# Patient Record
Sex: Female | Born: 1975 | Race: Black or African American | Hispanic: No | Marital: Single | Smoking: Former smoker
Health system: Southern US, Community
[De-identification: ages and names within clinical notes are randomized; demographics above are authoritative.]

## PROBLEM LIST (undated history)

## (undated) DIAGNOSIS — F419 Anxiety disorder, unspecified: Secondary | ICD-10-CM

## (undated) DIAGNOSIS — F319 Bipolar disorder, unspecified: Secondary | ICD-10-CM

## (undated) DIAGNOSIS — R519 Headache, unspecified: Secondary | ICD-10-CM

## (undated) DIAGNOSIS — T7840XA Allergy, unspecified, initial encounter: Secondary | ICD-10-CM

## (undated) DIAGNOSIS — F410 Panic disorder [episodic paroxysmal anxiety] without agoraphobia: Secondary | ICD-10-CM

## (undated) DIAGNOSIS — O321XX0 Maternal care for breech presentation, not applicable or unspecified: Secondary | ICD-10-CM

## (undated) DIAGNOSIS — F4310 Post-traumatic stress disorder, unspecified: Secondary | ICD-10-CM

## (undated) DIAGNOSIS — R569 Unspecified convulsions: Secondary | ICD-10-CM

## (undated) DIAGNOSIS — G8929 Other chronic pain: Secondary | ICD-10-CM

## (undated) DIAGNOSIS — H539 Unspecified visual disturbance: Secondary | ICD-10-CM

## (undated) DIAGNOSIS — N946 Dysmenorrhea, unspecified: Secondary | ICD-10-CM

## (undated) DIAGNOSIS — F329 Major depressive disorder, single episode, unspecified: Secondary | ICD-10-CM

## (undated) DIAGNOSIS — R5383 Other fatigue: Secondary | ICD-10-CM

## (undated) DIAGNOSIS — F32A Depression, unspecified: Secondary | ICD-10-CM

## (undated) HISTORY — DX: Dysmenorrhea, unspecified: N94.6

## (undated) HISTORY — DX: Other chronic pain: G89.29

## (undated) HISTORY — DX: Other fatigue: R53.83

## (undated) HISTORY — DX: Allergy, unspecified, initial encounter: T78.40XA

## (undated) HISTORY — DX: Anxiety disorder, unspecified: F41.9

## (undated) HISTORY — DX: Headache, unspecified: R51.9

## (undated) HISTORY — DX: Depression, unspecified: F32.A

## (undated) HISTORY — DX: Unspecified visual disturbance: H53.9

---
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
## 2016-06-05 ENCOUNTER — Encounter (HOSPITAL_COMMUNITY): Payer: Self-pay | Admitting: *Deleted

## 2016-06-05 ENCOUNTER — Emergency Department (HOSPITAL_COMMUNITY): Payer: Self-pay

## 2016-06-05 ENCOUNTER — Emergency Department (HOSPITAL_COMMUNITY)
Admission: EM | Admit: 2016-06-05 | Discharge: 2016-06-05 | Disposition: A | Discharge status: Home / Self Care | Payer: Self-pay | Attending: Emergency Medicine | Admitting: Emergency Medicine

## 2016-06-05 DIAGNOSIS — M549 Dorsalgia, unspecified: Secondary | ICD-10-CM

## 2016-06-05 DIAGNOSIS — R0789 Other chest pain: Secondary | ICD-10-CM | POA: Insufficient documentation

## 2016-06-05 DIAGNOSIS — D508 Other iron deficiency anemias: Secondary | ICD-10-CM | POA: Insufficient documentation

## 2016-06-05 DIAGNOSIS — M546 Pain in thoracic spine: Secondary | ICD-10-CM | POA: Insufficient documentation

## 2016-06-05 LAB — BASIC METABOLIC PANEL
ANION GAP: 6 (ref 5–15)
BUN: 8 mg/dL (ref 6–20)
CHLORIDE: 106 mmol/L (ref 101–111)
CO2: 24 mmol/L (ref 22–32)
Calcium: 9.9 mg/dL (ref 8.9–10.3)
Creatinine, Ser: 0.72 mg/dL (ref 0.44–1.00)
GFR calc non Af Amer: >60 mL/min (ref >60)
Glucose, Bld: 132 mg/dL — ABNORMAL HIGH (ref 65–99)
POTASSIUM: 3.6 mmol/L (ref 3.5–5.1)
SODIUM: 136 mmol/L (ref 135–145)

## 2016-06-05 LAB — I-STAT TROPONIN, ED
TROPONIN I, POC: 0 ng/mL (ref 0.00–0.08)
Troponin i, poc: 0 ng/mL (ref 0.00–0.08)

## 2016-06-05 LAB — CBC
HEMATOCRIT: 29.3 % — AB (ref 36.0–46.0)
HEMOGLOBIN: 8.6 g/dL — AB (ref 12.0–15.0)
MCH: 19.6 pg — AB (ref 26.0–34.0)
MCHC: 29.4 g/dL — ABNORMAL LOW (ref 30.0–36.0)
MCV: 66.9 fL — AB (ref 78.0–100.0)
PLATELETS: 395 10*3/uL (ref 150–400)
RBC: 4.38 MIL/uL (ref 3.87–5.11)
RDW: 19.7 % — ABNORMAL HIGH (ref 11.5–15.5)
WBC: 7.1 10*3/uL (ref 4.0–10.5)

## 2016-06-05 LAB — IRON AND TIBC
IRON: 11 ug/dL — AB (ref 28–170)
Saturation Ratios: 2 % — ABNORMAL LOW (ref 10.4–31.8)
TIBC: 472 ug/dL — AB (ref 250–450)
UIBC: 461 ug/dL

## 2016-06-05 LAB — RETICULOCYTES
RBC.: 4.41 MIL/uL (ref 3.87–5.11)
RETIC CT PCT: 1.2 % (ref 0.4–3.1)
Retic Count, Absolute: 52.9 10*3/uL (ref 19.0–186.0)

## 2016-06-05 LAB — FERRITIN: Ferritin: 3 ng/mL — ABNORMAL LOW (ref 11–307)

## 2016-06-05 LAB — POC OCCULT BLOOD, ED: FECAL OCCULT BLD: NEGATIVE

## 2016-06-05 LAB — VITAMIN B12: VITAMIN B 12: 219 pg/mL (ref 180–914)

## 2016-06-05 MED ORDER — FERROUS SULFATE 325 (65 FE) MG PO TABS: 325.0000 mg | ORAL_TABLET | Freq: Every day | ORAL | 0 refills | Status: DC

## 2016-06-05 MED ORDER — CYCLOBENZAPRINE HCL 10 MG PO TABS: 10.0000 mg | ORAL_TABLET | Freq: Two times a day (BID) | ORAL | 0 refills | Status: DC | PRN

## 2016-06-05 MED ORDER — NAPROXEN 500 MG PO TABS: 500.0000 mg | ORAL_TABLET | Freq: Two times a day (BID) | ORAL | 0 refills | Status: DC

## 2016-06-05 NOTE — ED Notes (Signed)
Pt stayed above 98% during ambulation

## 2016-06-05 NOTE — ED Notes (Signed)
Contacted lab for results of anemia panel.  Was informed by lab that this had to be sent to [facility] and would not result until later tonight.  This RN informed PA.

## 2016-06-05 NOTE — ED Triage Notes (Signed)
Pt complains of left sided chest pain, right back pain and shortness of breath for the past 2 days. Pt denies injury to back or chest. Pt states she has had similar pain in the past evaluated but was not diagnosed with anything. Pt states she has hx of anxiety.

## 2016-06-05 NOTE — ED Provider Notes (Signed)
[facility] DEPT Provider Note   CSN: [id number] Arrival date & time: 06/05/16  1244     History   Chief Complaint Chief Complaint  Patient presents with  . Chest Pain  . Back Pain    HPI Sarah Barber is a 40 y.o. female.  HPI   40 year old female presents with c/p and back pain.  Patient will report constant left-sided chest pain ongoing for the past 2 months. Describe pain as a fluttering sensation with occasional sharp pain sometimes worse with breathing and greater pain currently as 8 out of 10. The past 2 days she also experiencing right sided upper back pain that is sharp in nature and worsened with movement. No specific treatment tried. Denies any associated fever, chills, lightheadedness, dizziness, nausea, vomiting, diarrhea, diaphoresis, productive cough, hemoptysis. She does not have any significant cardiac history, she is a nonsmoker, is a social drinker. She is currently working at a call center and admits to having some mild increase of stress. She recently moved here from [place] and has 5 children. Her last menstrual period was 11/19. She denies any recent heavy strenuous activity or any recent injury.      History reviewed. No pertinent past medical history.  There are no active problems to display for this patient.   History reviewed. No pertinent surgical history.  OB History    No data available       Home Medications    Prior to Admission medications   Medication Sig Start Date End Date Taking? Authorizing Provider  ibuprofen (ADVIL,MOTRIN) 200 MG tablet Take 800 mg by mouth every 6 (six) hours as needed for headache, mild pain or moderate pain.   Yes Historical Provider, MD    Family History No family history on file.  Social History Social History  Substance Use Topics  . Smoking status: Never Smoker  . Smokeless tobacco: Never Used  . Alcohol use Yes     Allergies   Other   Review of Systems Review of Systems  All  other systems reviewed and are negative.    Physical Exam Updated Vital Signs BP 135/76 (BP Location: Left Arm)   Pulse 94   Temp 98.4 F (36.9 C) (Oral)   Resp 18   LMP 05/27/2016   SpO2 98%   Physical Exam  Constitutional: She appears well-developed and well-nourished. No distress.  HENT:  Head: Atraumatic.  Eyes: Conjunctivae are normal.  Neck: Neck supple.  Cardiovascular: Normal rate, regular rhythm and intact distal pulses.   Pulmonary/Chest: Effort normal and breath sounds normal. She exhibits tenderness (Tenderness to left anterior chest wall on palpation without crepitus or emphysema.).  Abdominal: Soft. She exhibits no distension. There is no tenderness.  Musculoskeletal: She exhibits tenderness (Tenderness to right posterior shoulder inferior to scapula ridge on palpation without overlying skin changes, crepitus or emphysema.). She exhibits no edema.  Neurological: She is alert.  Skin: No rash noted.  Psychiatric: She has a normal mood and affect.  Nursing note and vitals reviewed.    ED Treatments / Results  Labs (all labs ordered are listed, but only abnormal results are displayed) Labs Reviewed  BASIC METABOLIC PANEL - Abnormal; Notable for the following:       Result Value   Glucose, Bld 132 (*)    All other components within normal limits  CBC - Abnormal; Notable for the following:    Hemoglobin 8.6 (*)    HCT 29.3 (*)    MCV 66.9 (*)  MCH 19.6 (*)    MCHC 29.4 (*)    RDW 19.7 (*)    All other components within normal limits  I-STAT TROPOININ, ED    EKG  EKG Interpretation None      Date: 06/05/2016  Rate: 97  Rhythm: normal sinus rhythm  QRS Axis: normal  Intervals: normal  ST/T Wave abnormalities: normal  Conduction Disutrbances: none  Narrative Interpretation:   Old EKG Reviewed: No significant changes noted     Radiology No results found.  Procedures Procedures (including critical care time)  Medications Ordered in  ED Medications - No data to display   Initial Impression / Assessment and Plan / ED Course  I have reviewed the triage vital signs and the nursing notes.  Pertinent labs & imaging results that were available during my care of the patient were reviewed by me and considered in my medical decision making (see chart for details).  Clinical Course    BP 102/67 (BP Location: Right Arm)   Pulse 81   Temp 97.9 F (36.6 C) (Oral)   Resp 18   LMP 05/27/2016   SpO2 100%    Final Clinical Impressions(s) / ED Diagnoses   Final diagnoses:  Atypical chest pain  Upper back pain on right side  Other iron deficiency anemia    New Prescriptions New Prescriptions   CYCLOBENZAPRINE (FLEXERIL) 10 MG TABLET    Take 1 tablet (10 mg total) by mouth 2 (two) times daily as needed for muscle spasms.   FERROUS SULFATE 325 (65 FE) MG TABLET    Take 1 tablet (325 mg total) by mouth daily.   NAPROXEN (NAPROSYN) 500 MG TABLET    Take 1 tablet (500 mg total) by mouth 2 (two) times daily.   3:02 PM Patient presents with constant reproducible left chest wall pain and now right posterior back pain.  Pain atypical for ACS.  PERC negative,doubt PE. HEART score of 1, low risk of MACE.  Work up initiated.    4:25 PM Pain is likely muscle skeletal. Patient has negative delta troponin. Her labs is remarkable for a hemoglobin of 8.6 without any prior value for comparison.  Pt report she does have hx of anemia, and have heavy period, last menstruation on 11/19.  She is not currently on any treatment for that.  Have her ambulate and her O2 sats are stable.  Plan to obtain an anemia panel.  Will prescribe iron supplementation.  Care discussed with DR. Zammit.    6:55 PM Iron panel have not resulted yet.  Pt will be discharge with iron supplementation.  NSAIDs and muscle relaxant prescribed.  outpt f/u recommended.  Return precaution discussed.  Hemoccult negative.     Domenic Moras, PA-C 06/05/16 1937    Milton Ferguson, MD 06/06/16 [phone]

## 2016-06-06 LAB — FOLATE: Folate: 14.8 ng/mL (ref >5.9)

## 2019-01-28 ENCOUNTER — Other Ambulatory Visit: Payer: Self-pay

## 2019-01-28 DIAGNOSIS — Z20822 Contact with and (suspected) exposure to covid-19: Secondary | ICD-10-CM

## 2019-02-01 LAB — NOVEL CORONAVIRUS, NAA: SARS-CoV-2, NAA: NOT DETECTED

## 2019-04-03 ENCOUNTER — Encounter (HOSPITAL_COMMUNITY): Payer: Self-pay | Admitting: Emergency Medicine

## 2019-04-03 ENCOUNTER — Other Ambulatory Visit: Payer: Self-pay

## 2019-04-03 DIAGNOSIS — R1900 Intra-abdominal and pelvic swelling, mass and lump, unspecified site: Secondary | ICD-10-CM | POA: Insufficient documentation

## 2019-04-03 DIAGNOSIS — R1031 Right lower quadrant pain: Secondary | ICD-10-CM | POA: Insufficient documentation

## 2019-04-03 DIAGNOSIS — K8689 Other specified diseases of pancreas: Secondary | ICD-10-CM | POA: Insufficient documentation

## 2019-04-03 DIAGNOSIS — K8020 Calculus of gallbladder without cholecystitis without obstruction: Secondary | ICD-10-CM | POA: Insufficient documentation

## 2019-04-03 DIAGNOSIS — D649 Anemia, unspecified: Secondary | ICD-10-CM | POA: Insufficient documentation

## 2019-04-03 DIAGNOSIS — Z79899 Other long term (current) drug therapy: Secondary | ICD-10-CM | POA: Insufficient documentation

## 2019-04-03 LAB — COMPREHENSIVE METABOLIC PANEL
ALT: 17 U/L (ref 0–44)
AST: 20 U/L (ref 15–41)
Albumin: 3.7 g/dL (ref 3.5–5.0)
Alkaline Phosphatase: 62 U/L (ref 38–126)
Anion gap: 6 (ref 5–15)
BUN: 9 mg/dL (ref 6–20)
CO2: 25 mmol/L (ref 22–32)
Calcium: 9.2 mg/dL (ref 8.9–10.3)
Chloride: 106 mmol/L (ref 98–111)
Creatinine, Ser: 0.63 mg/dL (ref 0.44–1.00)
GFR calc Af Amer: >60 mL/min (ref >60)
GFR calc non Af Amer: >60 mL/min (ref >60)
Glucose, Bld: 125 mg/dL — ABNORMAL HIGH (ref 70–99)
Potassium: 3.9 mmol/L (ref 3.5–5.1)
Sodium: 137 mmol/L (ref 135–145)
Total Bilirubin: 0.5 mg/dL (ref 0.3–1.2)
Total Protein: 7.5 g/dL (ref 6.5–8.1)

## 2019-04-03 LAB — CBC
HCT: 27.4 % — ABNORMAL LOW (ref 36.0–46.0)
Hemoglobin: 7.2 g/dL — ABNORMAL LOW (ref 12.0–15.0)
MCH: 16.9 pg — ABNORMAL LOW (ref 26.0–34.0)
MCHC: 26.3 g/dL — ABNORMAL LOW (ref 30.0–36.0)
MCV: 64.3 fL — ABNORMAL LOW (ref 80.0–100.0)
Platelets: 248 10*3/uL (ref 150–400)
RBC: 4.26 MIL/uL (ref 3.87–5.11)
RDW: 23.9 % — ABNORMAL HIGH (ref 11.5–15.5)
WBC: 7.2 10*3/uL (ref 4.0–10.5)
nRBC: 0 % (ref 0.0–0.2)

## 2019-04-03 LAB — LIPASE, BLOOD: Lipase: 36 U/L (ref 11–51)

## 2019-04-03 LAB — HCG, QUANTITATIVE, PREGNANCY: hCG, Beta Chain, Quant, S: <1 m[IU]/mL (ref <5)

## 2019-04-03 MED ORDER — SODIUM CHLORIDE 0.9% FLUSH
3.0000 mL | Freq: Once | INTRAVENOUS | Status: AC
  Administered 2019-04-04: 3 mL via INTRAVENOUS

## 2019-04-03 NOTE — ED Triage Notes (Addendum)
Patient complaining of lower right abdominal pain and a headache. Patient states it has been going on for two months but it has gotten worse. Patient has not other complaints. Patient has taken naproxen and has not had any relief of pain.

## 2019-04-04 ENCOUNTER — Encounter (HOSPITAL_COMMUNITY): Payer: Self-pay

## 2019-04-04 ENCOUNTER — Emergency Department (HOSPITAL_COMMUNITY)
Admission: EM | Admit: 2019-04-04 | Discharge: 2019-04-04 | Disposition: A | Discharge status: Home / Self Care | Payer: Self-pay | Attending: Emergency Medicine | Admitting: Emergency Medicine

## 2019-04-04 ENCOUNTER — Emergency Department (HOSPITAL_COMMUNITY): Payer: Self-pay

## 2019-04-04 DIAGNOSIS — R1900 Intra-abdominal and pelvic swelling, mass and lump, unspecified site: Secondary | ICD-10-CM

## 2019-04-04 DIAGNOSIS — R1031 Right lower quadrant pain: Secondary | ICD-10-CM

## 2019-04-04 DIAGNOSIS — D509 Iron deficiency anemia, unspecified: Secondary | ICD-10-CM

## 2019-04-04 DIAGNOSIS — K8020 Calculus of gallbladder without cholecystitis without obstruction: Secondary | ICD-10-CM

## 2019-04-04 DIAGNOSIS — K8689 Other specified diseases of pancreas: Secondary | ICD-10-CM

## 2019-04-04 MED ORDER — ONDANSETRON HCL 4 MG/2ML IJ SOLN
4.0000 mg | Freq: Once | INTRAMUSCULAR | Status: AC
  Administered 2019-04-04: 02:00:00 4 mg via INTRAVENOUS
  Filled 2019-04-04: qty 2

## 2019-04-04 MED ORDER — METOCLOPRAMIDE HCL 10 MG PO TABS: 10.0000 mg | ORAL_TABLET | Freq: Four times a day (QID) | ORAL | 0 refills | Status: DC | PRN

## 2019-04-04 MED ORDER — FERROUS SULFATE 325 (65 FE) MG PO TABS: 325.0000 mg | ORAL_TABLET | Freq: Every day | ORAL | 0 refills | Status: DC

## 2019-04-04 MED ORDER — HYDROCODONE-ACETAMINOPHEN 5-325 MG PO TABS: 1.0000 | ORAL_TABLET | ORAL | 0 refills | Status: DC | PRN

## 2019-04-04 MED ORDER — MORPHINE SULFATE (PF) 4 MG/ML IV SOLN
4.0000 mg | Freq: Once | INTRAVENOUS | Status: AC
  Administered 2019-04-04: 4 mg via INTRAVENOUS
  Filled 2019-04-04: qty 1

## 2019-04-04 MED ORDER — IOHEXOL 300 MG/ML  SOLN
100.0000 mL | Freq: Once | INTRAMUSCULAR | Status: AC | PRN
  Administered 2019-04-04: 03:00:00 100 mL via INTRAVENOUS

## 2019-04-04 MED ORDER — SODIUM CHLORIDE 0.9 % IV BOLUS
1000.0000 mL | Freq: Once | INTRAVENOUS | Status: AC
  Administered 2019-04-04: 02:00:00 1000 mL via INTRAVENOUS

## 2019-04-04 NOTE — Discharge Instructions (Addendum)
Your CT scan showed large masses in your pelvis.  These are likely uterine fibroids, but you need to follow-up with the gynecologist to fully evaluate this.  Radiologist recommends an MRI scan.  Your CT scan also showed a slightly dilated duct in your pancreas.  Please follow-up with the gastroenterologist to evaluate this.  Radiologist recommends a test called MRCP to evaluate this.  Additionally, your CT scan showed you have a gallstone.  If you have upper abdominal pain with vomiting, and come to the emergency department.  While many people will have gallstones that do not cause any problems, if you start having pain from the gallstone, you would need surgery to remove the gallbladder.  Your hemoglobin is very low.  This is probably related to blood loss from your menstrual cycle.  I have given you a new prescription for iron tablets.  Please take this every day.

## 2019-04-04 NOTE — ED Provider Notes (Signed)
[facility] DEPT Provider Note   CSN: [id number] Arrival date & time: 04/03/19  2009    History   Chief Complaint Chief Complaint  Patient presents with   Abdominal Pain    HPI Sarah Barber is a 43 y.o. female.   The history is provided by the patient.  Abdominal Pain She has been having right lower abdominal pain for the last several months, getting worse.  Pain is constant.  It is worse if she lays prone, but nothing seems to make it better.  She currently rates pain at 8/10.  There is been some mild nausea but no vomiting.  She denies constipation or diarrhea.  She denies any urinary symptoms.  She has been having normal menses.  She claims she is sexually abstinent.  She denies fever, chills, sweats.  There has been no change in appetite and there has been no weight loss.  He has been taking naproxen, but it is not helping her pain.  History reviewed. No pertinent past medical history.  There are no active problems to display for this patient.   History reviewed. No pertinent surgical history.   OB History   No obstetric history on file.      Home Medications    Prior to Admission medications   Medication Sig Start Date End Date Taking? Authorizing Provider  cyclobenzaprine (FLEXERIL) 10 MG tablet Take 1 tablet (10 mg total) by mouth 2 (two) times daily as needed for muscle spasms. 06/05/16   Domenic Moras, PA-C  ferrous sulfate 325 (65 FE) MG tablet Take 1 tablet (325 mg total) by mouth daily. 06/05/16   Domenic Moras, PA-C  ibuprofen (ADVIL,MOTRIN) 200 MG tablet Take 800 mg by mouth every 6 (six) hours as needed for headache, mild pain or moderate pain.    [provider]  naproxen (NAPROSYN) 500 MG tablet Take 1 tablet (500 mg total) by mouth 2 (two) times daily. 06/05/16   Domenic Moras, PA-C    Family History History reviewed. No pertinent family history.  Social History Social History   Tobacco Use   Smoking status:  Never Smoker   Smokeless tobacco: Never Used  Substance Use Topics   Alcohol use: Yes   Drug use: Never     Allergies   Other   Review of Systems Review of Systems  Gastrointestinal: Positive for abdominal pain.  All other systems reviewed and are negative.    Physical Exam Updated Vital Signs BP 121/70 (BP Location: Left Arm)    Pulse (!) 107    Temp 98.5 F (36.9 C) (Oral)    Resp 18    Ht 5\' 8"  (1.727 m)    Wt 77.1 kg    LMP 03/13/2019    SpO2 100%    BMI 25.85 kg/m   Physical Exam Vitals signs and nursing note reviewed.    43 year old female, resting comfortably and in no acute distress. Vital signs are significant for slightly elevated heart rate. Oxygen saturation is 100%, which is normal. Head is normocephalic and atraumatic. PERRLA, EOMI. Oropharynx is clear. Neck is nontender and supple without adenopathy or JVD. Back is nontender and there is no CVA tenderness. Lungs are clear without rales, wheezes, or rhonchi. Chest is nontender. Heart has regular rate and rhythm without murmur. Abdomen is soft, flat, with moderate right lower quadrant tenderness.  There is no rebound or guarding.  There are no masses or hepatosplenomegaly and peristalsis is normoactive. Extremities have no cyanosis or  edema, full range of motion is present. Skin is warm and dry without rash. Neurologic: Mental status is normal, cranial nerves are intact, there are no motor or sensory deficits.  ED Treatments / Results  Labs (all labs ordered are listed, but only abnormal results are displayed) Labs Reviewed  COMPREHENSIVE METABOLIC PANEL - Abnormal; Notable for the following components:      Result Value   Glucose, Bld 125 (*)    All other components within normal limits  CBC - Abnormal; Notable for the following components:   Hemoglobin 7.2 (*)    HCT 27.4 (*)    MCV 64.3 (*)    MCH 16.9 (*)    MCHC 26.3 (*)    RDW 23.9 (*)    All other components within normal limits  LIPASE,  BLOOD  HCG, QUANTITATIVE, PREGNANCY  URINALYSIS, ROUTINE W REFLEX MICROSCOPIC   Radiology Ct Abdomen Pelvis W Contrast  Result Date: 04/04/2019 CLINICAL DATA:  Right lower quadrant abdominal pain. Patient reports progressive pain for 2 months. EXAM: CT ABDOMEN AND PELVIS WITH CONTRAST TECHNIQUE: Multidetector CT imaging of the abdomen and pelvis was performed using the standard protocol following bolus administration of intravenous contrast. CONTRAST:  132mL OMNIPAQUE IOHEXOL 300 MG/ML  SOLN COMPARISON:  None. FINDINGS: Lower chest: Breathing motion artifact through the lung bases.6 no consolidation. Hepatobiliary: Right hepatic dome not entirely included in the field of view. No focal abnormality. Small calcified gallstones within physiologically distended gallbladder. No biliary dilatation. Pancreas: No peripancreatic inflammation. Prominent proximal pancreatic duct at 5 mm. No evidence pancreatic mass. Spleen: Normal in size without focal abnormality. Adrenals/Urinary Tract: Normal adrenal glands. No hydronephrosis or perinephric edema. Homogeneous renal enhancement with symmetric excretion on delayed phase imaging. Urinary bladder is physiologically distended without wall thickening. Stomach/Bowel: Stomach is within normal limits. Appendix appears normal. No evidence of bowel wall thickening, distention, or inflammatory changes. Vascular/Lymphatic: Normal caliber abdominal aorta. No acute vascular findings. No enlarged lymph nodes in the abdomen or pelvis. Reproductive: Large heterogeneous enhancing bilateral adnexal/pelvic masses. Unclear if this represents a bilobed mass or cyst to adjacent lesions. On the right this measures 10.4 x 16.7 x 12.2 cm. On the left this measures 7.9 x 6.2 x 5.9 cm with a coarse calcification.1. This is contiguous with the fundus of the uterus. Findings may represent large uterine fibroids versus ovarian masses. Other: Trace free fluid in the pelvis. No upper abdominal  ascites. No evidence of omental caking. No free air. Small fat containing umbilical hernia. Musculoskeletal: There are no acute or suspicious osseous abnormalities. IMPRESSION: 1. Large heterogeneous enhancing bilateral adnexal/pelvic masses. These may represent large exophytic uterine fibroids versus ovarian masses. Recommend GYN consultation. While pelvic ultrasound may be helpful for characterization, further definition of anatomy may be better defined with MRI. 2. Cholelithiasis without gallbladder inflammation. 3. 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 No peripancreatic inflammation. Recommend further evaluation with MRCP on an elective basis. Electronically Signed   By: Keith Rake M.D.   On: 04/04/2019 03:59    Procedures Procedures   Medications Ordered in ED Medications  sodium chloride flush (NS) 0.9 % injection 3 mL (has no administration in time range)  sodium chloride 0.9 % bolus 1,000 mL (0 mLs Intravenous Stopped 04/04/19 0414)  morphine 4 MG/ML injection 4 mg (4 mg Intravenous Given 04/04/19 0221)  ondansetron (ZOFRAN) injection 4 mg (4 mg Intravenous Given 04/04/19 0220)  iohexol (OMNIPAQUE) 300 MG/ML solution 100 mL (100 mLs Intravenous Contrast Given 04/04/19 0247)  Initial Impression / Assessment and Plan / ED Course  I have reviewed the triage vital signs and the nursing notes.  Pertinent labs & imaging results that were available during my care of the patient were reviewed by me and considered in my medical decision making (see chart for details).  Right lower quadrant pain of uncertain cause.  With chronicity of symptoms, doubt appendicitis.  Also, doubt diverticulitis.  Abdominal tenderness is outside of the true pelvis.  Initial labs show microcytic anemia which is slightly worse than 3 years ago.  She will be sent for CT of abdomen and pelvis.  CT scan shows large pelvic masses which are likely uterine fibroids, but could be from  ovaries.  Additional findings include dilated pancreatic duct, and gallstone.  Patient is advised of these findings.  She is referred to gynecology for further evaluation of her pelvic masses, and referred to gastroenterology to evaluate her dilated pancreatic duct.  Anemia is presumably secondary to menstrual blood loss.  She had been on iron in the past and she is given a new prescription for iron.  She is given prescription for metoclopramide and a small number of hydrocodone-acetaminophen tablets.  Return precautions discussed.  Final Clinical Impressions(s) / ED Diagnoses   Final diagnoses:  RLQ abdominal pain  Pelvic mass  Cholelithiasis without cholecystitis  Dilated pancreatic duct  Microcytic anemia    ED Discharge Orders         Ordered    ferrous sulfate 325 (65 FE) MG tablet  Daily     04/04/19 0417    Ambulatory referral to Obstetrics / Gynecology     04/04/19 0417    Ambulatory referral to Gastroenterology    Comments: Evaluate dilated pancreatic duct   04/04/19 0417    HYDROcodone-acetaminophen (NORCO) 5-325 MG tablet  Every 4 hours PRN     04/04/19 0417    metoCLOPramide (REGLAN) 10 MG tablet  Every 6 hours PRN     04/04/19 [id number]           Delora Fuel, MD [id number]

## 2019-04-09 ENCOUNTER — Other Ambulatory Visit: Payer: Self-pay

## 2019-04-09 ENCOUNTER — Encounter: Payer: Self-pay | Admitting: Gastroenterology

## 2019-04-09 ENCOUNTER — Ambulatory Visit (INDEPENDENT_AMBULATORY_CARE_PROVIDER_SITE_OTHER): Payer: Self-pay | Admitting: Gastroenterology

## 2019-04-09 VITALS — BP 98/68 | HR 79 | Temp 98.2°F | Ht 68.0 in | Wt 174.0 lb

## 2019-04-09 DIAGNOSIS — R1030 Lower abdominal pain, unspecified: Secondary | ICD-10-CM

## 2019-04-09 DIAGNOSIS — R9389 Abnormal findings on diagnostic imaging of other specified body structures: Secondary | ICD-10-CM

## 2019-04-09 DIAGNOSIS — K8689 Other specified diseases of pancreas: Secondary | ICD-10-CM

## 2019-04-09 DIAGNOSIS — K8020 Calculus of gallbladder without cholecystitis without obstruction: Secondary | ICD-10-CM

## 2019-04-09 MED ORDER — TRAMADOL HCL 50 MG PO TABS: 50.0000 mg | ORAL_TABLET | Freq: Three times a day (TID) | ORAL | 0 refills | Status: DC | PRN

## 2019-04-09 NOTE — Progress Notes (Signed)
Sarah Barber    [id number]    07-Sep-1975  Primary Care Physician:Patient, No Pcp Per  Referring Physician: Delora Fuel, MD [address] [place],  [place] [address]   Chief complaint:  Lower abdominal pain  HPI:  43 year old female here for new patient visit with complaints of lower abdominal pain.  She was recently seen in the ER April 04, 2019.    CT abdomen and pelvis with contrast on April 04, 2019 showed large heterogenous mass >10-15cm, bilobed mass or cystic lesion with calcification and is contiguous with the fundus of the uterus.   She continues to have persistent pain, she started having menstrual cycle last Friday when the pain started and she usually has significant discomfort during her menstrual.  Denies menorrhagia. No change in bowel habits, melena or rectal bleeding.  Incidental finding of dilated proximal pancreatic duct to 5 mm with no evidence of pancreatic cyst or mass lesion.  Lipase and LFT within normal limits.  Additional small calcified gallstones within distended gallbladder with no gallbladder wall thickening or findings to suggest cholecystitis.   Outpatient Encounter Medications as of 04/09/2019  Medication Sig  . ferrous sulfate 325 (65 FE) MG tablet Take 1 tablet (325 mg total) by mouth daily.  Marland Kitchen HYDROcodone-acetaminophen (NORCO) 5-325 MG tablet Take 1 tablet by mouth every 4 (four) hours as needed for moderate pain.  Marland Kitchen ibuprofen (ADVIL,MOTRIN) 200 MG tablet Take 800 mg by mouth every 6 (six) hours as needed for headache, mild pain or moderate pain.  . [DISCONTINUED] metoCLOPramide (REGLAN) 10 MG tablet Take 1 tablet (10 mg total) by mouth every 6 (six) hours as needed for nausea (or headache). (Patient not taking: Reported on 04/09/2019)   No facility-administered encounter medications on file as of 04/09/2019.     Allergies as of 04/09/2019 - Review Complete 04/09/2019  Allergen Reaction Noted  . Other Rash 06/05/2016     No past medical history on file.  Past Surgical History:  Procedure Laterality Date  . CESAREAN SECTION      Family History  Problem Relation Age of Onset  . Diabetes Mother   . Cerebrovascular Accident Father        x2  . Diabetes Maternal Grandmother   . Kidney disease Maternal Grandmother   . Lung cancer Maternal Grandfather     Social History   Socioeconomic History  . Marital status: Legally Separated    Spouse name: Not on file  . Number of children: Not on file  . Years of education: Not on file  . Highest education level: Not on file  Occupational History  . Not on file  Social Needs  . Financial resource strain: Not on file  . Food insecurity    Worry: Not on file    Inability: Not on file  . Transportation needs    Medical: Not on file    Non-medical: Not on file  Tobacco Use  . Smoking status: Never Smoker  . Smokeless tobacco: Never Used  Substance and Sexual Activity  . Alcohol use: Yes  . Drug use: Never  . Sexual activity: Not on file  Lifestyle  . Physical activity    Days per week: Not on file    Minutes per session: Not on file  . Stress: Not on file  Relationships  . Social Herbalist on phone: Not on file    Gets together: Not on file  Attends religious service: Not on file    Active member of club or organization: Not on file    Attends meetings of clubs or organizations: Not on file    Relationship status: Not on file  . Intimate partner violence    Fear of current or ex partner: Not on file    Emotionally abused: Not on file    Physically abused: Not on file    Forced sexual activity: Not on file  Other Topics Concern  . Not on file  Social History Narrative  . Not on file      Review of systems: Review of Systems  Constitutional: Negative for fever and chills.  HENT: Negative.   Eyes: Negative for blurred vision.  Respiratory: Negative for cough, shortness of breath and wheezing.   Cardiovascular:  Negative for chest pain and palpitations.  Gastrointestinal: as per HPI Genitourinary: Negative for dysuria, urgency, frequency and hematuria.  Musculoskeletal: Negative for myalgias, back pain and joint pain.  Skin: Negative for itching and rash.  Neurological: Negative for dizziness, tremors, focal weakness, seizures and loss of consciousness.  Endo/Heme/Allergies: Positive for seasonal allergies.  Psychiatric/Behavioral: Negative for suicidal ideas and hallucinations.  Positive for depression All other systems reviewed and are negative.   Physical Exam: Vitals:   04/09/19 0826  BP: 98/68  Pulse: 79  Temp: 98.2 F (36.8 C)   Body mass index is 26.46 kg/m. Gen:      No acute distress HEENT:  EOMI, sclera anicteric Neck:     No masses; no thyromegaly Lungs:    Clear to auscultation bilaterally; normal respiratory effort CV:         Regular rate and rhythm; no murmurs Abd:      + bowel sounds; lower abdomen palpable firm mass with tenderness  Ext:    No edema; adequate peripheral perfusion Skin:      Warm and dry; no rash Neuro: alert and oriented x 3 Psych: normal mood and affect  Data Reviewed:  Reviewed labs, radiology imaging, old records and pertinent past GI work up   Assessment and Plan/Recommendations:  43 year old female with with history of depression here with complaints of lower abdominal pain. Large heterogeneous ovarian mass versus fibroid  Incidental finding of gallstones and slightly dilated prominent proximal pancreatic duct. Gallstone with no cholecystitis. No LFT abnormality We will obtain MRI MRCP to better characterize PD dilation and exclude any pancreatic lesions.  If no other etiology other than gallstones, can consider cholecystectomy once she had work-up/management for the ovarian mass  Advised patient to follow-up with GYN for the heterogeneous ovarian/uterine mass  Tramadol 50 mg every 8 hours as needed for pain control, discuss with  PMD/GYN if needed additional pain control  Return as needed   K. Denzil Magnuson , MD    CC: Delora Fuel, MD

## 2019-04-09 NOTE — Patient Instructions (Addendum)
You have been scheduled for an MRI/MRCP at [facility] Department on 04/17/2019 at 3pm. Your appointment time is 3pm. Please arrive 15 minutes prior to your appointment time for registration purposes. Please make certain not to have anything to eat or drink 6 hours prior to your test. In addition, if you have any metal in your body, have a pacemaker or defibrillator, please be sure to let your ordering physician know. This test typically takes 45 minutes to 1 hour to complete. Should you need to reschedule, please call [phone] to do so.  You need to schedule an urgent appointment with your GYN  We have sent Tramadol to your pharmacy   If you are age 4 or older, your body mass index should be between 23-30. Your Body mass index is 26.46 kg/m. If this is out of the aforementioned range listed, please consider follow up with your Primary Care Provider.  If you are age 67 or younger, your body mass index should be between 19-25. Your Body mass index is 26.46 kg/m. If this is out of the aformentioned range listed, please consider follow up with your Primary Care Provider.    I appreciate the  opportunity to care for you  Thank You   Harl Bowie , MD

## 2019-04-17 ENCOUNTER — Ambulatory Visit (HOSPITAL_COMMUNITY)
Admission: RE | Admit: 2019-04-17 | Discharge: 2019-04-17 | Disposition: A | Discharge status: Home / Self Care | Payer: Medicaid Other | Source: Ambulatory Visit | Attending: Gastroenterology | Admitting: Gastroenterology

## 2019-04-17 ENCOUNTER — Other Ambulatory Visit: Payer: Self-pay

## 2019-04-17 ENCOUNTER — Other Ambulatory Visit: Payer: Self-pay | Admitting: Gastroenterology

## 2019-04-17 DIAGNOSIS — R9389 Abnormal findings on diagnostic imaging of other specified body structures: Secondary | ICD-10-CM | POA: Insufficient documentation

## 2019-04-17 DIAGNOSIS — R1030 Lower abdominal pain, unspecified: Secondary | ICD-10-CM | POA: Diagnosis not present

## 2019-04-17 MED ORDER — GADOBUTROL 1 MMOL/ML IV SOLN
8.0000 mL | Freq: Once | INTRAVENOUS | Status: AC | PRN
  Administered 2019-04-17: 8 mL via INTRAVENOUS

## 2019-04-20 ENCOUNTER — Telehealth: Payer: Self-pay | Admitting: Gastroenterology

## 2019-04-20 NOTE — Telephone Encounter (Signed)
[facility] radiology calling regarding MR results, report in epic. Dr. Silverio Decamp notified report ready in epic.

## 2019-04-21 ENCOUNTER — Telehealth: Payer: Self-pay | Admitting: Gastroenterology

## 2019-04-21 NOTE — Telephone Encounter (Signed)
Pt spoke to Dr. Silverio Decamp today.

## 2019-04-21 NOTE — Telephone Encounter (Signed)
Ok thanks 

## 2019-04-22 NOTE — Progress Notes (Signed)
reviewed

## 2019-04-27 ENCOUNTER — Encounter: Payer: Self-pay | Admitting: Family Medicine

## 2019-04-27 ENCOUNTER — Other Ambulatory Visit: Payer: Self-pay

## 2019-04-27 ENCOUNTER — Ambulatory Visit (INDEPENDENT_AMBULATORY_CARE_PROVIDER_SITE_OTHER): Payer: Self-pay | Admitting: Family Medicine

## 2019-04-27 VITALS — BP 115/70 | HR 98 | Wt 175.0 lb

## 2019-04-27 DIAGNOSIS — D259 Leiomyoma of uterus, unspecified: Secondary | ICD-10-CM

## 2019-04-27 DIAGNOSIS — Z01419 Encounter for gynecological examination (general) (routine) without abnormal findings: Secondary | ICD-10-CM

## 2019-04-27 DIAGNOSIS — Z124 Encounter for screening for malignant neoplasm of cervix: Secondary | ICD-10-CM

## 2019-04-27 DIAGNOSIS — Z113 Encounter for screening for infections with a predominantly sexual mode of transmission: Secondary | ICD-10-CM

## 2019-04-27 DIAGNOSIS — Z1231 Encounter for screening mammogram for malignant neoplasm of breast: Secondary | ICD-10-CM

## 2019-04-27 DIAGNOSIS — N939 Abnormal uterine and vaginal bleeding, unspecified: Secondary | ICD-10-CM

## 2019-04-27 DIAGNOSIS — Z1151 Encounter for screening for human papillomavirus (HPV): Secondary | ICD-10-CM

## 2019-04-27 DIAGNOSIS — Z803 Family history of malignant neoplasm of breast: Secondary | ICD-10-CM | POA: Insufficient documentation

## 2019-04-27 LAB — POCT PREGNANCY, URINE: Preg Test, Ur: NEGATIVE

## 2019-04-27 NOTE — Patient Instructions (Signed)

## 2019-04-27 NOTE — Progress Notes (Signed)
GYNECOLOGY ANNUAL PREVENTATIVE CARE ENCOUNTER NOTE  Subjective:   Sarah Barber is a 43 y.o. No obstetric history on file. female here for a annual gynecologic exam. Current complaints: abnormal uterine bleeding.   Denies abnormal vaginal bleeding, discharge, pelvic pain, problems with intercourse or other gynecologic concerns.   She is having periods once a month, but she reports heavy periods, bleeding through overnight pads during the daytime, and now has to wear diapers to contain the blood. She reports using 7-8 diapers in a 24-hour period. Her periods last 5-6 days, with heavy bleeding for at least 4 days.  She described abdominal pain similar to when she was pregnant with her 5th child, and had to hold up her abdomen due to round ligament pain. She says it feels like pulling in her right lower quadrant with lots of pressure and dull achiness. She feels like her abdomen is full, and any external pressure to the abdomen will aggravate it. No diarrhea, constipation, blood in her bowel movements or urine. Denies obstipation, nausea, or vomiting.   Gynecologic History No LMP recorded. LMP 04/09/19 Contraception: none, abstains from sex Last Pap: 2017. Results were: normal Last mammogram: 2017. Results were: normal Gardisil: has not received, declines  Obstetric History OB History  No obstetric history on file.    Past Medical History:  Diagnosis Date  . Allergies   . Anxiety   . Chronic headaches   . Depression   . Fatigue   . Menstrual pain   . Vision changes     Past Surgical History:  Procedure Laterality Date  . CESAREAN SECTION      Current Outpatient Medications on File Prior to Visit  Medication Sig Dispense Refill  . ferrous sulfate 325 (65 FE) MG tablet Take 1 tablet (325 mg total) by mouth daily. (Patient not taking: Reported on 04/27/2019) 30 tablet 0  . HYDROcodone-acetaminophen (NORCO) 5-325 MG tablet Take 1 tablet by mouth every 4 (four) hours as needed  for moderate pain. (Patient not taking: Reported on 04/27/2019) 10 tablet 0  . ibuprofen (ADVIL,MOTRIN) 200 MG tablet Take 800 mg by mouth every 6 (six) hours as needed for headache, mild pain or moderate pain.    . traMADol (ULTRAM) 50 MG tablet Take 1 tablet (50 mg total) by mouth every 8 (eight) hours as needed. (Patient not taking: Reported on 04/27/2019) 30 tablet 0   No current facility-administered medications on file prior to visit.     Allergies  Allergen Reactions  . Other Rash    pineapple,cabbage    Social History   Socioeconomic History  . Marital status: Legally Separated    Spouse name: Not on file  . Number of children: Not on file  . Years of education: Not on file  . Highest education level: Not on file  Occupational History  . Not on file  Social Needs  . Financial resource strain: Not on file  . Food insecurity    Worry: Not on file    Inability: Not on file  . Transportation needs    Medical: Not on file    Non-medical: Not on file  Tobacco Use  . Smoking status: Never Smoker  . Smokeless tobacco: Never Used  Substance and Sexual Activity  . Alcohol use: Yes  . Drug use: Never  . Sexual activity: Not on file  Lifestyle  . Physical activity    Days per week: Not on file    Minutes per session: Not on file  .  Stress: Not on file  Relationships  . Social Herbalist on phone: Not on file    Gets together: Not on file    Attends religious service: Not on file    Active member of club or organization: Not on file    Attends meetings of clubs or organizations: Not on file    Relationship status: Not on file  . Intimate partner violence    Fear of current or ex partner: Not on file    Emotionally abused: Not on file    Physically abused: Not on file    Forced sexual activity: Not on file  Other Topics Concern  . Not on file  Social History Narrative  . Not on file    Family History  Problem Relation Age of Onset  . Diabetes Mother    . Cerebrovascular Accident Father        x2  . Diabetes Maternal Grandmother   . Kidney disease Maternal Grandmother   . Lung cancer Maternal Grandfather     Diet: varied Exercise: denies  The following portions of the patient's history were reviewed and updated as appropriate: allergies, current medications, past family history, past medical history, past social history, past surgical history and problem list.  Review of Systems Pertinent items are noted in HPI.   Objective:  BP 115/70   Pulse 98   Wt 175 lb (79.4 kg)   BMI 26.61 kg/m  CONSTITUTIONAL: Well-developed, well-nourished female in no acute distress.  HENT:  Normocephalic, atraumatic, External right and left ear normal. Oropharynx is clear and moist EYES: Conjunctivae and EOM are normal. Pupils are equal, round, and reactive to light. No scleral icterus.  NECK: Normal range of motion, supple, no masses.  Normal thyroid.  SKIN: Skin is warm and dry. No rash noted. Not diaphoretic. No erythema. No pallor. NEUROLOGIC: Alert and oriented to person, place, and time. Normal reflexes, muscle tone coordination. No cranial nerve deficit noted. PSYCHIATRIC: Normal mood and affect. Normal behavior. Normal judgment and thought content. CARDIOVASCULAR: Normal heart rate noted, regular rhythm RESPIRATORY: Clear to auscultation bilaterally. Effort and breath sounds normal, no problems with respiration noted. BREASTS: Symmetric in size. No masses, skin changes, nipple drainage, or lymphadenopathy. ABDOMEN: Soft, normal bowel sounds, no distention noted.  No  rebound or guarding. Tenderness to palpation of the right lower quadrant, right uterine fullness palpated on bimanual. PELVIC: Normal appearing external genitalia; normal appearing vaginal mucosa and cervix, with the cervix high posteriolateral to the left.  No abnormal discharge noted.  Pap smear obtained.  No uterine or adnexal tenderness. Enlargement of the right uterus was  palpated to the size of a 20-week gravid uterus. MUSCULOSKELETAL: Normal range of motion. No tenderness.  No cyanosis, clubbing, or edema. 2+ distal pulses.  Exam done with chaperone present.  MRI ABDOMEN 04/17/19:  15.8 cm solid right pelvic mass, with apparent stalk/bridge with the right uterine body/fundus, suggesting a large pedunculated uterine fibroid. Given the imaging characteristics, it is unfortunately not possible to exclude leiomyosarcoma by imaging.  Small uterine fibroids, measuring up to 17 mm in the right anterior fundus. Uterus is displaced to the left, accounting for the apparent left adnexal mass on recent CT.  Bilateral ovaries are within normal limits. Assessment and Plan:   1. Well woman exam with routine gynecological exam - Cytology - PAP( [facility]) - Declines STI screening  2. Uterine leiomyoma, unspecified location - Recommend surgical evaluation with OBGYN for removal as her pain  sounds similar to round ligament pain when she is pregnant - Cytology - PAP( )  3. Family history of breast cancer in first degree relative -Mammo ordered  4. Encounter for screening mammogram for malignant neoplasm of breast -Mammo ordered as her mother was diagnosed age 32 - MM Digital Screening; Future  5. Abnormal uterine bleeding - EMB attempted in office today, was not successful due to inability to positioning of the cervix - Most likely 2/2 fibroid   Will follow up results of pap smear and manage accordingly. Encouraged improvement in diet and exercise.  Mammogram - ordered Flu vaccine declined today Gardisil declined  Routine preventative health maintenance measures emphasized. Please refer to After Visit Summary for other counseling recommendations.   Total face-to-face time with patient: 35 minutes. Over 50% of encounter was spent on counseling and coordination of care.   Merilyn Baba, DO OB Fellow, [facility] 04/27/2019 1:33  PM

## 2019-05-01 LAB — CYTOLOGY - PAP
Chlamydia: NEGATIVE
Comment: NEGATIVE
Comment: NEGATIVE
Comment: NEGATIVE
Comment: NORMAL
Diagnosis: NEGATIVE
High risk HPV: NEGATIVE
Neisseria Gonorrhea: NEGATIVE
Trichomonas: NEGATIVE

## 2019-05-04 ENCOUNTER — Telehealth: Payer: Self-pay | Admitting: Family Medicine

## 2019-05-04 ENCOUNTER — Telehealth: Payer: Self-pay | Admitting: *Deleted

## 2019-05-04 NOTE — Telephone Encounter (Signed)
Spoke to patient to let her know that we have not received any fmla paperwork to be completed. Patient instructed that she would drop them off by the office

## 2019-05-04 NOTE — Telephone Encounter (Signed)
Attempted to return patient's call that she left on the nurse line about her FMLA paperwork and return to work letter. We never received paperwork for this patient to complete and after looking through the chart she has not put out of work. Unable to leave message for patient due to the number stated that the call could not be completed.

## 2019-05-04 NOTE — Telephone Encounter (Signed)
Sarah Barber called and left a message she was last seen on the 19th and her job states she needs a letter to return to work. Wants to know if whoever soes that can send a letter to her Mychart account. States will return to work 05/07/19 because she is off Monday, Tuesday, Wednesday.  Also wants to know status of her FMLA paperwork.  I called Tristen and informed her I do not see that our provider took her out of work ; so I do not think we can provide her with a letter stating to return to work. I informed her we can give her a letter that we saw her in our office 04/27/19. She asked that I prepare the letter and put it in [place] which I confirmed I will. She also asked if the person doing FMLA had done her papers. I informed her I will forward her request to that person and they will get in touch with her.  Linda,RN

## 2019-05-08 ENCOUNTER — Encounter: Payer: Self-pay | Admitting: *Deleted

## 2019-05-15 ENCOUNTER — Telehealth: Payer: Self-pay | Admitting: Family Medicine

## 2019-05-15 NOTE — Telephone Encounter (Signed)
Attempted to contact patient about why were are filling out FMLA paperwork for her. No answer, number stated that the call could not be completed at this time. mychart message sent

## 2019-05-28 ENCOUNTER — Other Ambulatory Visit: Payer: Self-pay

## 2019-05-28 ENCOUNTER — Ambulatory Visit (INDEPENDENT_AMBULATORY_CARE_PROVIDER_SITE_OTHER): Payer: Self-pay | Admitting: Family Medicine

## 2019-05-28 ENCOUNTER — Encounter: Payer: Self-pay | Admitting: Family Medicine

## 2019-05-28 VITALS — BP 116/74 | HR 86 | Wt 176.8 lb

## 2019-05-28 DIAGNOSIS — D259 Leiomyoma of uterus, unspecified: Secondary | ICD-10-CM

## 2019-05-28 DIAGNOSIS — N939 Abnormal uterine and vaginal bleeding, unspecified: Secondary | ICD-10-CM

## 2019-05-28 MED ORDER — MEGESTROL ACETATE 40 MG PO TABS: 40.0000 mg | ORAL_TABLET | Freq: Two times a day (BID) | ORAL | 3 refills | Status: DC

## 2019-05-28 NOTE — Progress Notes (Signed)
   Subjective:    Patient ID: Sarah Barber is a 43 y.o. female presenting with Follow-up  on 05/28/2019  HPI: Here today to discuss fibroids. Has had them x 10 years.Last 2 years increasing pan and bleeding and inability to button her pants due to increasing girth. MRI shows large pedunculated 17 cm fibroid uterus is displaced to the left. Attempts at EMB unsuccessful in the office. Desires permanent treatment. nml pap.    Review of Systems  Constitutional: Negative for chills and fever.  Respiratory: Negative for shortness of breath.   Cardiovascular: Negative for chest pain.  Gastrointestinal: Negative for abdominal pain, nausea and vomiting.  Genitourinary: Negative for dysuria.  Skin: Negative for rash.     Objective:    BP 116/74   Pulse 86   Wt 176 lb 12.8 oz (80.2 kg)   LMP 05/06/2019 (Exact Date)   BMI 26.88 kg/m  Physical Exam Constitutional:      General: She is not in acute distress.    Appearance: She is well-developed.  HENT:     Head: Normocephalic and atraumatic.  Eyes:     General: No scleral icterus. Neck:     Musculoskeletal: Neck supple.  Cardiovascular:     Rate and Rhythm: Normal rate.  Pulmonary:     Effort: Pulmonary effort is normal.  Abdominal:     Palpations: Abdomen is soft.     Comments: 20 wk size uterus  Skin:    General: Skin is warm and dry.  Neurological:     Mental Status: She is alert and oriented to person, place, and time.         Assessment & Plan:   Problem List Items Addressed This Visit      Unprioritized   Fibroid uterus    Hgb is 7.8 and has fibroids Begin megace with cycles. Book for TAH ad bilateral Salpingectomy. Risks include but are not limited to bleeding, infection, injury to surrounding structures, including bowel, bladder and ureters, blood clots, and death.  Likelihood of success is high. Discussed possibility of endometrial cancer since no sampling able to be done in office and leiomyosarcoma, given  growth. Still rare but possible and discussed with patient at length.       Abnormal uterine bleeding - Primary   Relevant Medications   megestrol (MEGACE) 40 MG tablet      Total face-to-face time with patient: 25 minutes. Over 50% of encounter was spent on counseling and coordination of care. Return in about 3 months (around 08/28/2019) for postop check.  [name] 05/28/2019 5:20 PM

## 2019-05-28 NOTE — Assessment & Plan Note (Signed)
Hgb is 7.8 and has fibroids Begin megace with cycles. Book for TAH ad bilateral Salpingectomy. Risks include but are not limited to bleeding, infection, injury to surrounding structures, including bowel, bladder and ureters, blood clots, and death.  Likelihood of success is high. Discussed possibility of endometrial cancer since no sampling able to be done in office and leiomyosarcoma, given growth. Still rare but possible and discussed with patient at length.

## 2019-05-28 NOTE — Patient Instructions (Signed)
Hysterectomy Information  A hysterectomy is a surgery in which the uterus is removed. The fallopian tubes and ovaries may be removed (bilateral salpingo-oophorectomy) as well. This procedure may be done to treat various medical problems. After the procedure, a woman will no longer have menstrual periods nor will she be able to become pregnant (sterile). What are the reasons for a hysterectomy? There are many reasons why a woman might have this procedure. They include:  Persistent, abnormal vaginal bleeding.  Long-term (chronic) pelvic pain or infection.  Endometriosis. This is when the lining of the uterus (endometrium) starts to grow outside the uterus.  Adenomyosis. This is when the endometrium starts to grow in the muscle of the uterus.  Pelvic organ prolapse. This is a condition in which the uterus falls down into the vagina.  Noncancerous growths in the uterus (uterine fibroids) that cause symptoms.  The presence of precancerous cells.  Cervical or uterine cancer. What are the different types of hysterectomy? There are three different types of hysterectomy:  Supracervical hysterectomy. In this type, the top part of the uterus is removed, but not the cervix.  Total hysterectomy. In this type, the uterus and cervix are removed.  Radical hysterectomy. In this type, the uterus, the cervix, and the tissue that holds the uterus in place (parametrium) are removed. What are the different ways a hysterectomy can be performed? There are many different ways a hysterectomy can be performed, including:  Abdominal hysterectomy. In this type, an incision is made in the abdomen. The uterus is removed through this incision.  Vaginal hysterectomy. In this type, an incision is made in the vagina. The uterus is removed through this incision. There are no abdominal incisions.  Conventional laparoscopic hysterectomy. In this type, three or four small incisions are made in the abdomen. A thin,  lighted tube with a camera (laparoscope) is inserted into one of the incisions. Other tools are put through the other incisions. The uterus is cut into small pieces. The small pieces are removed through the incisions or through the vagina.  Laparoscopically assisted vaginal hysterectomy (LAVH). In this type, three or four small incisions are made in the abdomen. Part of the surgery is performed laparoscopically and the other part is done vaginally. The uterus is removed through the vagina.  Robot-assisted laparoscopic hysterectomy. In this type, a laparoscope and other tools are inserted into three or four small incisions in the abdomen. A computer-controlled device is used to give the surgeon a 3D image and to help control the surgical instruments. This allows for more precise movements of surgical instruments. The uterus is cut into small pieces and removed through the incisions or removed through the vagina. Discuss the options with your health care provider to determine which type is the right one for you. What are the risks? Generally, this is a safe procedure. However, problems may occur, including:  Bleeding and risk of blood transfusion. Tell your health care provider if you do not want to receive any blood products.  Blood clots in the legs or lung.  Infection.  Damage to other structures or organs.  Allergic reactions to medicines.  Changing to an abdominal hysterectomy from one of the other techniques. What to expect after a hysterectomy  You will be given pain medicine.  You may need to stay in the hospital for 1- 2 days to recover, depending on the type of hysterectomy you had.  Follow your health care provider's instructions about exercise, driving, and general activities. Ask your   health care provider what activities are safe for you.  You will need to have someone with you for the first 3-5 days after you go home.  You will need to follow up with your surgeon in 2-4  weeks after surgery to evaluate your progress.  If the ovaries are removed, you will have early menopause symptoms such as hot flashes, night sweats, and insomnia.  If you had a hysterectomy for a problem that was not cancer or not a condition that could lead to cancer, then you no longer need Pap tests. However, even if you no longer need a Pap test, a regular pelvic exam is a good idea to make sure no other problems are developing. Questions to ask your health care provider  Is a hysterectomy medically necessary? Do I have other treatment options for my condition?  What are my options for hysterectomy procedure?  What organs and tissues need to be removed?  What are the risks?  What are the benefits?  How long will I need to stay in the hospital after the procedure?  How long will I need to recover at home?  What symptoms can I expect after the procedure? Summary  A hysterectomy is a surgery in which the uterus is removed. The fallopian tubes and ovaries may be removed (bilateral salpingo-oophorectomy) as well.  This procedure may be done to treat various medical problems. After the procedure, a woman will no longer have menstrual periods nor will she be able to become pregnant.  Discuss the options with your health care provider to determine which type of hysterectomy is the right one for you. This information is not intended to replace advice given to you by your health care provider. Make sure you discuss any questions you have with your health care provider. Document Released: 12/19/2000 Document Revised: 06/07/2017 Document Reviewed: 08/01/2016 Elsevier Patient Education  2020 [employer].  

## 2019-06-01 ENCOUNTER — Telehealth: Payer: Self-pay | Admitting: General Practice

## 2019-06-01 NOTE — Telephone Encounter (Signed)
Called patient regarding need to come in to sign a hysterectomy statement form prior to surgery. Patient verbalized understanding & states she will come next week after the holidays.

## 2019-06-18 ENCOUNTER — Encounter: Payer: Self-pay | Admitting: *Deleted

## 2019-06-29 DIAGNOSIS — Z029 Encounter for administrative examinations, unspecified: Secondary | ICD-10-CM

## 2019-07-21 NOTE — Patient Instructions (Addendum)
YOU ARE SCHEDULED FOR A COVID TEST _________@____________ . THIS TEST MUST BE DONE BEFORE SURGERY. GO TO  [address], [place], [address] AND REMAIN IN YOUR CAR, THIS IS A DRIVE UP TEST. ONCE YOUR COVID TEST IS DONE PLEASE FOLLOW ALL THE QUARANTINE  INSTRUCTIONS GIVEN IN YOUR HANDOUT.      Your procedure is scheduled on Tuesday 07/28/2019  Report to [place]. M.   Call this number if you have problems the morning of surgery  [phone].   OUR ADDRESS IS [address].  WE ARE LOCATED IN [facility].                                     REMEMBER:  DO NOT EAT FOOD OR DRINK LIQUIDS AFTER MIDNIGHT .     YOU MAY  BRUSH YOUR TEETH MORNING OF SURGERY AND RINSE YOUR MOUTH OUT, NO CHEWING GUM CANDY OR MINTS.   TAKE THESE MEDICATIONS MORNING OF SURGERY WITH A SIP OF WATER:   Megesterol (Megace)  IF YOU ARE SPENDING THE NIGHT AFTER SURGERY PLEASE BRING ALL YOUR PRESCRIPTION MEDICATIONS IN THEIR ORIGINAL BOTTLES. 1 VISITOR IS ALLOWED IN WAITING ROOM ONLY DAY OF SURGERY. NO VISITOR MAY SPEND THE NIGHT. VISITOR ARE ALLOWED TO STAY UNTIL 800 PM.                                    DO NOT WEAR JEWERLY, MAKE UP, OR NAIL POLISH ON FINGERNAILS. DO NOT WEAR LOTIONS, POWDERS, PERFUMES OR DEODORANT. DO NOT SHAVE FOR 24 HOURS PRIOR TO DAY OF SURGERY.  CONTACTS, GLASSES, OR DENTURES MAY NOT BE WORN TO SURGERY.                                    [facility] IS NOT RESPONSIBLE  FOR ANY BELONGINGS.                                                                    Marland Kitchen                                                                                                    [facility] - Preparing for Surgery Before surgery, you can play an important role.  Because skin is not sterile, your skin needs to be as free of germs as possible.  You can reduce the number of germs on your skin by washing with CHG (chlorahexidine gluconate) soap before surgery.  CHG  is an antiseptic cleaner which kills germs and bonds with the skin to  continue killing germs even after washing. Please DO NOT use if you have an allergy to CHG or antibacterial soaps.  If your skin becomes reddened/irritated stop using the CHG and inform your nurse when you arrive at Short Stay. Do not shave (including legs and underarms) for at least 48 hours prior to the first CHG shower.  You may shave your face/neck. Please follow these instructions carefully:  1.  Shower with CHG Soap the night before surgery and the  morning of Surgery.  2.  If you choose to wash your hair, wash your hair first as usual with your  normal  shampoo.  3.  After you shampoo, rinse your hair and body thoroughly to remove the  shampoo.                           4.  Use CHG as you would any other liquid soap.  You can apply chg directly  to the skin and wash                       Gently with a scrungie or clean washcloth.  5.  Apply the CHG Soap to your body ONLY FROM THE NECK DOWN.   Do not use on face/ open                           Wound or open sores. Avoid contact with eyes, ears mouth and genitals (private parts).                       Wash face,  Genitals (private parts) with your normal soap.             6.  Wash thoroughly, paying special attention to the area where your surgery  will be performed.  7.  Thoroughly rinse your body with warm water from the neck down.  8.  DO NOT shower/wash with your normal soap after using and rinsing off  the CHG Soap.                9.  Pat yourself dry with a clean towel.            10.  Wear clean pajamas.            11.  Place clean sheets on your bed the night of your first shower and do not  sleep with pets. Day of Surgery : Do not apply any lotions/deodorants the morning of surgery.  Please wear clean clothes to the hospital/surgery center.  FAILURE TO FOLLOW THESE INSTRUCTIONS MAY RESULT IN THE CANCELLATION OF YOUR SURGERY PATIENT  SIGNATURE_________________________________  NURSE SIGNATURE__________________________________  ________________________________________________________________________

## 2019-07-23 DIAGNOSIS — D62 Acute posthemorrhagic anemia: Secondary | ICD-10-CM | POA: Diagnosis present

## 2019-07-23 NOTE — H&P (Signed)
Sarah Barber is an 44 y.o. [phone] female.   Chief Complaint: Fibroid and bleeding HPI: Here today to discuss fibroids. Has had them x 10 years.Last 2 years increasing pan and bleeding and inability to button her pants due to increasing girth. MRI shows large pedunculated 17 cm fibroid uterus is displaced to the left. Attempts at EMB unsuccessful in the office. Desires permanent treatment. nml pap.    Past Medical History:  Diagnosis Date  . Allergies   . Anxiety   . Chronic headaches   . Depression   . Fatigue   . Menstrual pain   . Vision changes     Past Surgical History:  Procedure Laterality Date  . CESAREAN SECTION      Family History  Problem Relation Age of Onset  . Diabetes Mother   . Cerebrovascular Accident Father        x2  . Diabetes Maternal Grandmother   . Kidney disease Maternal Grandmother   . Lung cancer Maternal Grandfather    Social History:  reports that she has never smoked. She has never used smokeless tobacco. She reports current alcohol use. She reports that she does not use drugs.  Allergies:  Allergies  Allergen Reactions  . Other Rash    pineapple,cabbage    No medications prior to admission.    Pertinent items are noted in HPI.  There were no vitals taken for this visit. General appearance: alert, cooperative and appears stated age Head: Normocephalic, without obvious abnormality, atraumatic Neck: supple, symmetrical, trachea midline Lungs: normal effort Heart: regular rate and rhythm Abdomen: 20 wk size uterus Extremities: Homans sign is negative, no sign of DVT Skin: Skin color, texture, turgor normal. No rashes or lesions Neurologic: Grossly normal   Lab Results  Component Value Date   WBC 7.2 04/03/2019   HGB 7.2 (L) 04/03/2019   HCT 27.4 (L) 04/03/2019   MCV 64.3 (L) 04/03/2019   PLT 248 04/03/2019   Lab Results  Component Value Date   PREGTESTUR NEGATIVE 04/27/2019     Assessment/Plan Active Problems:  Fibroid uterus   Abnormal uterine bleeding   Anemia due to acute blood loss  For TAH with bilateral salpingectomy Hgb is 7.8 and has fibroids Begin megace with cycles. Book for TAH ad bilateral Salpingectomy. Risks include but are not limited to bleeding, infection, injury to surrounding structures, including bowel, bladder and ureters, blood clots, and death.  Likelihood of success is high. Discussed possibility of endometrial cancer since no sampling able to be done in office and leiomyosarcoma, given growth. Still rare but possible and discussed with patient at length.  [name] 07/23/2019, 9:03 AM

## 2019-07-24 ENCOUNTER — Encounter (HOSPITAL_COMMUNITY)
Admission: RE | Admit: 2019-07-24 | Discharge: 2019-07-24 | Disposition: A | Discharge status: Home / Self Care | Payer: Medicaid Other | Source: Ambulatory Visit | Attending: Family Medicine | Admitting: Family Medicine

## 2019-07-24 ENCOUNTER — Encounter (HOSPITAL_COMMUNITY): Payer: Medicaid Other

## 2019-07-24 ENCOUNTER — Other Ambulatory Visit: Payer: Self-pay

## 2019-07-24 DIAGNOSIS — Z20822 Contact with and (suspected) exposure to covid-19: Secondary | ICD-10-CM | POA: Insufficient documentation

## 2019-07-24 DIAGNOSIS — Z01812 Encounter for preprocedural laboratory examination: Secondary | ICD-10-CM | POA: Diagnosis present

## 2019-07-25 LAB — NOVEL CORONAVIRUS, NAA (HOSP ORDER, SEND-OUT TO REF LAB; TAT 18-24 HRS): SARS-CoV-2, NAA: NOT DETECTED

## 2019-07-27 ENCOUNTER — Other Ambulatory Visit: Payer: Self-pay

## 2019-07-27 ENCOUNTER — Encounter (HOSPITAL_COMMUNITY): Payer: Self-pay | Admitting: Family Medicine

## 2019-07-27 NOTE — Progress Notes (Signed)
Ms Sarah Barber denies chest pain or shortness of breath. Ms Sarah Barber tested negative for Covid 07/24/2019 and has been in quarantine with family since that time.

## 2019-07-28 ENCOUNTER — Inpatient Hospital Stay (HOSPITAL_COMMUNITY): Payer: Medicaid Other

## 2019-07-28 ENCOUNTER — Encounter (HOSPITAL_COMMUNITY)
Admission: RE | Disposition: A | Discharge status: Home / Self Care | Payer: Self-pay | Source: Home / Self Care | Attending: Family Medicine

## 2019-07-28 ENCOUNTER — Inpatient Hospital Stay (HOSPITAL_COMMUNITY)
Admission: RE | Admit: 2019-07-28 | Discharge: 2019-07-29 | DRG: 742 | Disposition: A | Discharge status: Home / Self Care | Payer: Medicaid Other | Attending: Family Medicine | Admitting: Family Medicine

## 2019-07-28 ENCOUNTER — Encounter (HOSPITAL_COMMUNITY): Payer: Self-pay | Admitting: Family Medicine

## 2019-07-28 ENCOUNTER — Other Ambulatory Visit: Payer: Self-pay

## 2019-07-28 DIAGNOSIS — Z87891 Personal history of nicotine dependence: Secondary | ICD-10-CM | POA: Diagnosis not present

## 2019-07-28 DIAGNOSIS — Z8249 Family history of ischemic heart disease and other diseases of the circulatory system: Secondary | ICD-10-CM

## 2019-07-28 DIAGNOSIS — F419 Anxiety disorder, unspecified: Secondary | ICD-10-CM | POA: Diagnosis present

## 2019-07-28 DIAGNOSIS — Z90710 Acquired absence of both cervix and uterus: Secondary | ICD-10-CM | POA: Diagnosis present

## 2019-07-28 DIAGNOSIS — Z833 Family history of diabetes mellitus: Secondary | ICD-10-CM | POA: Diagnosis not present

## 2019-07-28 DIAGNOSIS — D259 Leiomyoma of uterus, unspecified: Principal | ICD-10-CM | POA: Diagnosis present

## 2019-07-28 DIAGNOSIS — D62 Acute posthemorrhagic anemia: Secondary | ICD-10-CM | POA: Diagnosis present

## 2019-07-28 DIAGNOSIS — N939 Abnormal uterine and vaginal bleeding, unspecified: Secondary | ICD-10-CM

## 2019-07-28 DIAGNOSIS — F319 Bipolar disorder, unspecified: Secondary | ICD-10-CM | POA: Diagnosis present

## 2019-07-28 DIAGNOSIS — Z20822 Contact with and (suspected) exposure to covid-19: Secondary | ICD-10-CM | POA: Diagnosis present

## 2019-07-28 DIAGNOSIS — Z801 Family history of malignant neoplasm of trachea, bronchus and lung: Secondary | ICD-10-CM | POA: Diagnosis not present

## 2019-07-28 DIAGNOSIS — D251 Intramural leiomyoma of uterus: Secondary | ICD-10-CM

## 2019-07-28 DIAGNOSIS — N938 Other specified abnormal uterine and vaginal bleeding: Secondary | ICD-10-CM | POA: Diagnosis present

## 2019-07-28 DIAGNOSIS — D250 Submucous leiomyoma of uterus: Secondary | ICD-10-CM

## 2019-07-28 DIAGNOSIS — D252 Subserosal leiomyoma of uterus: Secondary | ICD-10-CM

## 2019-07-28 DIAGNOSIS — Z91018 Allergy to other foods: Secondary | ICD-10-CM

## 2019-07-28 HISTORY — DX: Post-traumatic stress disorder, unspecified: F43.10

## 2019-07-28 HISTORY — DX: Panic disorder (episodic paroxysmal anxiety): F41.0

## 2019-07-28 HISTORY — DX: Bipolar disorder, unspecified: F31.9

## 2019-07-28 HISTORY — PX: HYSTERECTOMY ABDOMINAL WITH SALPINGECTOMY: SHX6725

## 2019-07-28 HISTORY — DX: Unspecified convulsions: R56.9

## 2019-07-28 LAB — CBC WITH DIFFERENTIAL/PLATELET
Abs Immature Granulocytes: 0 10*3/uL (ref 0.00–0.07)
Basophils Absolute: 0 10*3/uL (ref 0.0–0.1)
Basophils Relative: 0 %
Eosinophils Absolute: 0.3 10*3/uL (ref 0.0–0.5)
Eosinophils Relative: 4 %
HCT: 31.3 % — ABNORMAL LOW (ref 36.0–46.0)
Hemoglobin: 8.5 g/dL — ABNORMAL LOW (ref 12.0–15.0)
Lymphocytes Relative: 19 %
Lymphs Abs: 1.4 10*3/uL (ref 0.7–4.0)
MCH: 18 pg — ABNORMAL LOW (ref 26.0–34.0)
MCHC: 27.2 g/dL — ABNORMAL LOW (ref 30.0–36.0)
MCV: 66.2 fL — ABNORMAL LOW (ref 80.0–100.0)
Monocytes Absolute: 0.4 10*3/uL (ref 0.1–1.0)
Monocytes Relative: 5 %
Neutro Abs: 5.3 10*3/uL (ref 1.7–7.7)
Neutrophils Relative %: 72 %
Platelets: 252 10*3/uL (ref 150–400)
RBC: 4.73 MIL/uL (ref 3.87–5.11)
RDW: 23.1 % — ABNORMAL HIGH (ref 11.5–15.5)
WBC: 7.4 10*3/uL (ref 4.0–10.5)
nRBC: 0 % (ref 0.0–0.2)
nRBC: 0 /100 WBC

## 2019-07-28 LAB — CBC
HCT: 27.2 % — ABNORMAL LOW (ref 36.0–46.0)
Hemoglobin: 7.8 g/dL — ABNORMAL LOW (ref 12.0–15.0)
MCH: 18.7 pg — ABNORMAL LOW (ref 26.0–34.0)
MCHC: 28.7 g/dL — ABNORMAL LOW (ref 30.0–36.0)
MCV: 65.1 fL — ABNORMAL LOW (ref 80.0–100.0)
Platelets: 249 10*3/uL (ref 150–400)
RBC: 4.18 MIL/uL (ref 3.87–5.11)
RDW: 23 % — ABNORMAL HIGH (ref 11.5–15.5)
WBC: 20.1 10*3/uL — ABNORMAL HIGH (ref 4.0–10.5)
nRBC: 0 % (ref 0.0–0.2)

## 2019-07-28 LAB — CREATININE, SERUM
Creatinine, Ser: 0.68 mg/dL (ref 0.44–1.00)
GFR calc Af Amer: >60 mL/min (ref >60)
GFR calc non Af Amer: >60 mL/min (ref >60)

## 2019-07-28 LAB — PREPARE RBC (CROSSMATCH)

## 2019-07-28 LAB — POCT PREGNANCY, URINE: Preg Test, Ur: NEGATIVE

## 2019-07-28 SURGERY — HYSTERECTOMY, TOTAL, ABDOMINAL, WITH SALPINGECTOMY
Anesthesia: General | Site: Abdomen | Laterality: Bilateral

## 2019-07-28 MED ORDER — MENTHOL 3 MG MT LOZG: 1.0000 | LOZENGE | OROMUCOSAL | Status: DC | PRN

## 2019-07-28 MED ORDER — SUGAMMADEX SODIUM 200 MG/2ML IV SOLN
INTRAVENOUS | Status: DC | PRN
  Administered 2019-07-28: 200 mg via INTRAVENOUS

## 2019-07-28 MED ORDER — SIMETHICONE 80 MG PO CHEW
80.0000 mg | CHEWABLE_TABLET | Freq: Four times a day (QID) | ORAL | Status: DC | PRN
  Administered 2019-07-28 – 2019-07-29 (×2): 80 mg via ORAL
  Filled 2019-07-28 (×2): qty 1

## 2019-07-28 MED ORDER — ACETAMINOPHEN 500 MG PO TABS: 1000.0000 mg | ORAL_TABLET | ORAL | Status: AC

## 2019-07-28 MED ORDER — CLONIDINE HCL (ANALGESIA) 100 MCG/ML EP SOLN
EPIDURAL | Status: DC | PRN
  Administered 2019-07-28 (×2): 50 ug

## 2019-07-28 MED ORDER — SODIUM CHLORIDE 0.9 % IV SOLN: INTRAVENOUS | Status: DC | PRN

## 2019-07-28 MED ORDER — DEXAMETHASONE SODIUM PHOSPHATE 10 MG/ML IJ SOLN
INTRAMUSCULAR | Status: AC
  Filled 2019-07-28: qty 1

## 2019-07-28 MED ORDER — PROPOFOL 10 MG/ML IV BOLUS
INTRAVENOUS | Status: AC
  Filled 2019-07-28: qty 40

## 2019-07-28 MED ORDER — GABAPENTIN 100 MG PO CAPS
200.0000 mg | ORAL_CAPSULE | Freq: Three times a day (TID) | ORAL | Status: DC
  Administered 2019-07-28 – 2019-07-29 (×3): 200 mg via ORAL
  Filled 2019-07-28 (×3): qty 2

## 2019-07-28 MED ORDER — ROCURONIUM BROMIDE 50 MG/5ML IV SOSY
PREFILLED_SYRINGE | INTRAVENOUS | Status: DC | PRN
  Administered 2019-07-28 (×2): 50 mg via INTRAVENOUS

## 2019-07-28 MED ORDER — GABAPENTIN 300 MG PO CAPS: 300.0000 mg | ORAL_CAPSULE | ORAL | Status: AC

## 2019-07-28 MED ORDER — DEXAMETHASONE SODIUM PHOSPHATE 10 MG/ML IJ SOLN
INTRAMUSCULAR | Status: DC | PRN
  Administered 2019-07-28: 5 mg via INTRAVENOUS

## 2019-07-28 MED ORDER — KETOROLAC TROMETHAMINE 30 MG/ML IJ SOLN: 30.0000 mg | Freq: Once | INTRAMUSCULAR | Status: DC

## 2019-07-28 MED ORDER — GABAPENTIN 300 MG PO CAPS
ORAL_CAPSULE | ORAL | Status: AC
  Administered 2019-07-28: 09:00:00 300 mg via ORAL
  Filled 2019-07-28: qty 1

## 2019-07-28 MED ORDER — ACETAMINOPHEN 500 MG PO TABS
ORAL_TABLET | ORAL | Status: AC
  Administered 2019-07-28: 1000 mg via ORAL
  Filled 2019-07-28: qty 2

## 2019-07-28 MED ORDER — ALBUMIN HUMAN 5 % IV SOLN: INTRAVENOUS | Status: DC | PRN

## 2019-07-28 MED ORDER — WHITE PETROLATUM EX OINT
TOPICAL_OINTMENT | CUTANEOUS | Status: AC
  Administered 2019-07-29: 0.2
  Filled 2019-07-28: qty 28.35

## 2019-07-28 MED ORDER — FENTANYL CITRATE (PF) 100 MCG/2ML IJ SOLN
INTRAMUSCULAR | Status: AC
  Filled 2019-07-28: qty 2

## 2019-07-28 MED ORDER — KETOROLAC TROMETHAMINE 30 MG/ML IJ SOLN
30.0000 mg | Freq: Four times a day (QID) | INTRAMUSCULAR | Status: DC
  Administered 2019-07-28 – 2019-07-29 (×3): 30 mg via INTRAVENOUS
  Filled 2019-07-28 (×3): qty 1

## 2019-07-28 MED ORDER — SCOPOLAMINE 1 MG/3DAYS TD PT72
MEDICATED_PATCH | TRANSDERMAL | Status: DC | PRN
  Administered 2019-07-28: 1 via TRANSDERMAL

## 2019-07-28 MED ORDER — PROMETHAZINE HCL 25 MG/ML IJ SOLN: 6.2500 mg | INTRAMUSCULAR | Status: DC | PRN

## 2019-07-28 MED ORDER — ONDANSETRON HCL 4 MG/2ML IJ SOLN
INTRAMUSCULAR | Status: AC
  Filled 2019-07-28: qty 2

## 2019-07-28 MED ORDER — LIDOCAINE 2% (20 MG/ML) 5 ML SYRINGE
INTRAMUSCULAR | Status: DC | PRN
  Administered 2019-07-28: 60 mg via INTRAVENOUS

## 2019-07-28 MED ORDER — ROCURONIUM BROMIDE 10 MG/ML (PF) SYRINGE
PREFILLED_SYRINGE | INTRAVENOUS | Status: AC
  Filled 2019-07-28: qty 10

## 2019-07-28 MED ORDER — DOCUSATE SODIUM 100 MG PO CAPS
100.0000 mg | ORAL_CAPSULE | Freq: Two times a day (BID) | ORAL | Status: DC
  Administered 2019-07-28 – 2019-07-29 (×3): 100 mg via ORAL
  Filled 2019-07-28 (×3): qty 1

## 2019-07-28 MED ORDER — PROPOFOL 10 MG/ML IV BOLUS
INTRAVENOUS | Status: DC | PRN
  Administered 2019-07-28: 170 mg via INTRAVENOUS

## 2019-07-28 MED ORDER — BUPIVACAINE HCL (PF) 0.25 % IJ SOLN
INTRAMUSCULAR | Status: AC
  Filled 2019-07-28: qty 30

## 2019-07-28 MED ORDER — ONDANSETRON HCL 4 MG PO TABS: 4.0000 mg | ORAL_TABLET | Freq: Four times a day (QID) | ORAL | Status: DC | PRN

## 2019-07-28 MED ORDER — BISACODYL 10 MG RE SUPP: 10.0000 mg | Freq: Every day | RECTAL | Status: DC | PRN

## 2019-07-28 MED ORDER — BUPIVACAINE HCL (PF) 0.25 % IJ SOLN
INTRAMUSCULAR | Status: DC | PRN
  Administered 2019-07-28: 30 mL

## 2019-07-28 MED ORDER — MIDAZOLAM HCL 2 MG/2ML IJ SOLN
INTRAMUSCULAR | Status: AC
  Filled 2019-07-28: qty 2

## 2019-07-28 MED ORDER — DIPHENHYDRAMINE HCL 50 MG/ML IJ SOLN
INTRAMUSCULAR | Status: DC | PRN
  Administered 2019-07-28: 6.25 mg via INTRAVENOUS

## 2019-07-28 MED ORDER — MIDAZOLAM HCL 2 MG/2ML IJ SOLN: 2.0000 mg | Freq: Once | INTRAMUSCULAR | Status: AC

## 2019-07-28 MED ORDER — FENTANYL CITRATE (PF) 250 MCG/5ML IJ SOLN
INTRAMUSCULAR | Status: AC
  Filled 2019-07-28: qty 5

## 2019-07-28 MED ORDER — MIDAZOLAM HCL 2 MG/2ML IJ SOLN
INTRAMUSCULAR | Status: AC
  Administered 2019-07-28: 2 mg via INTRAVENOUS
  Filled 2019-07-28: qty 2

## 2019-07-28 MED ORDER — SOD CITRATE-CITRIC ACID 500-334 MG/5ML PO SOLN: 30.0000 mL | ORAL | Status: AC

## 2019-07-28 MED ORDER — BUPIVACAINE-EPINEPHRINE (PF) 0.25% -1:200000 IJ SOLN
INTRAMUSCULAR | Status: DC | PRN
  Administered 2019-07-28 (×2): 30 mL via PERINEURAL

## 2019-07-28 MED ORDER — LACTATED RINGERS IV SOLN: INTRAVENOUS | Status: DC | PRN

## 2019-07-28 MED ORDER — CEFAZOLIN SODIUM-DEXTROSE 2-4 GM/100ML-% IV SOLN
2.0000 g | INTRAVENOUS | Status: AC
  Administered 2019-07-28: 2 g via INTRAVENOUS

## 2019-07-28 MED ORDER — FENTANYL CITRATE (PF) 100 MCG/2ML IJ SOLN
25.0000 ug | INTRAMUSCULAR | Status: DC | PRN
  Administered 2019-07-28 (×2): 50 ug via INTRAVENOUS

## 2019-07-28 MED ORDER — LACTATED RINGERS IV SOLN: INTRAVENOUS | Status: DC

## 2019-07-28 MED ORDER — CEFAZOLIN SODIUM-DEXTROSE 2-4 GM/100ML-% IV SOLN
INTRAVENOUS | Status: AC
  Filled 2019-07-28: qty 100

## 2019-07-28 MED ORDER — FENTANYL CITRATE (PF) 100 MCG/2ML IJ SOLN: 50.0000 ug | Freq: Once | INTRAMUSCULAR | Status: AC

## 2019-07-28 MED ORDER — SOD CITRATE-CITRIC ACID 500-334 MG/5ML PO SOLN
ORAL | Status: AC
  Administered 2019-07-28: 30 mL via ORAL
  Filled 2019-07-28: qty 30

## 2019-07-28 MED ORDER — DIPHENHYDRAMINE HCL 50 MG/ML IJ SOLN
INTRAMUSCULAR | Status: AC
  Filled 2019-07-28: qty 1

## 2019-07-28 MED ORDER — TEMAZEPAM 15 MG PO CAPS: 15.0000 mg | ORAL_CAPSULE | Freq: Every evening | ORAL | Status: DC | PRN

## 2019-07-28 MED ORDER — HYDROMORPHONE HCL 1 MG/ML IJ SOLN: 0.2000 mg | INTRAMUSCULAR | Status: DC | PRN

## 2019-07-28 MED ORDER — OXYCODONE HCL 5 MG PO TABS: 5.0000 mg | ORAL_TABLET | Freq: Four times a day (QID) | ORAL | 0 refills | Status: DC | PRN

## 2019-07-28 MED ORDER — FENTANYL CITRATE (PF) 100 MCG/2ML IJ SOLN
INTRAMUSCULAR | Status: DC | PRN
  Administered 2019-07-28: 100 ug via INTRAVENOUS

## 2019-07-28 MED ORDER — DEXTROSE IN LACTATED RINGERS 5 % IV SOLN: INTRAVENOUS | Status: DC

## 2019-07-28 MED ORDER — ONDANSETRON HCL 4 MG/2ML IJ SOLN: 4.0000 mg | Freq: Four times a day (QID) | INTRAMUSCULAR | Status: DC | PRN

## 2019-07-28 MED ORDER — ENOXAPARIN SODIUM 40 MG/0.4ML ~~LOC~~ SOLN: 40.0000 mg | SUBCUTANEOUS | Status: DC

## 2019-07-28 MED ORDER — IBUPROFEN 800 MG PO TABS: 800.0000 mg | ORAL_TABLET | Freq: Four times a day (QID) | ORAL | Status: DC

## 2019-07-28 MED ORDER — ONDANSETRON HCL 4 MG/2ML IJ SOLN
INTRAMUSCULAR | Status: DC | PRN
  Administered 2019-07-28: 4 mg via INTRAVENOUS

## 2019-07-28 MED ORDER — ACETAMINOPHEN 325 MG PO TABS: 650.0000 mg | ORAL_TABLET | ORAL | Status: DC | PRN

## 2019-07-28 MED ORDER — LIDOCAINE 2% (20 MG/ML) 5 ML SYRINGE
INTRAMUSCULAR | Status: AC
  Filled 2019-07-28: qty 5

## 2019-07-28 MED ORDER — OXYCODONE HCL 5 MG PO TABS
5.0000 mg | ORAL_TABLET | ORAL | Status: DC | PRN
  Administered 2019-07-28 (×2): 10 mg via ORAL
  Filled 2019-07-28 (×2): qty 2

## 2019-07-28 MED ORDER — FENTANYL CITRATE (PF) 100 MCG/2ML IJ SOLN
INTRAMUSCULAR | Status: AC
  Administered 2019-07-28: 50 ug via INTRAVENOUS
  Filled 2019-07-28: qty 2

## 2019-07-28 SURGICAL SUPPLY — 39 items
BARRIER ADHS 3X4 INTERCEED (GAUZE/BANDAGES/DRESSINGS) IMPLANT
BENZOIN TINCTURE PRP APPL 2/3 (GAUZE/BANDAGES/DRESSINGS) ×2 IMPLANT
CANISTER SUCT 3000ML PPV (MISCELLANEOUS) ×3 IMPLANT
CLOSURE WOUND 1/2 X4 (GAUZE/BANDAGES/DRESSINGS) ×1
CONT SPEC 4OZ CLIKSEAL STRL BL (MISCELLANEOUS) ×2 IMPLANT
COVER WAND RF STERILE (DRAPES) ×3 IMPLANT
DECANTER SPIKE VIAL GLASS SM (MISCELLANEOUS) IMPLANT
DRAPE WARM FLUID 44X44 (DRAPES) ×2 IMPLANT
DRSG OPSITE POSTOP 4X10 (GAUZE/BANDAGES/DRESSINGS) ×3 IMPLANT
DURAPREP 26ML APPLICATOR (WOUND CARE) ×3 IMPLANT
GAUZE 4X4 16PLY RFD (DISPOSABLE) ×4 IMPLANT
GLOVE BIOGEL PI IND STRL 7.0 (GLOVE) ×3 IMPLANT
GLOVE BIOGEL PI INDICATOR 7.0 (GLOVE) ×6
GLOVE ECLIPSE 7.0 STRL STRAW (GLOVE) ×3 IMPLANT
GOWN STRL REUS W/ TWL LRG LVL3 (GOWN DISPOSABLE) ×3 IMPLANT
GOWN STRL REUS W/TWL LRG LVL3 (GOWN DISPOSABLE) ×6
HEMOSTAT ARISTA ABSORB 3G PWDR (HEMOSTASIS) ×2 IMPLANT
HIBICLENS CHG 4% 4OZ BTL (MISCELLANEOUS) ×3 IMPLANT
KIT TURNOVER KIT B (KITS) ×3 IMPLANT
NEEDLE HYPO 22GX1.5 SAFETY (NEEDLE) ×3 IMPLANT
NS IRRIG 1000ML POUR BTL (IV SOLUTION) ×5 IMPLANT
PACK ABDOMINAL GYN (CUSTOM PROCEDURE TRAY) ×3 IMPLANT
PAD ARMBOARD 7.5X6 YLW CONV (MISCELLANEOUS) ×3 IMPLANT
PAD OB MATERNITY 4.3X12.25 (PERSONAL CARE ITEMS) ×3 IMPLANT
SPECIMEN JAR MEDIUM (MISCELLANEOUS) ×3 IMPLANT
SPONGE LAP 18X18 RF (DISPOSABLE) ×2 IMPLANT
STRIP CLOSURE SKIN 1/2X4 (GAUZE/BANDAGES/DRESSINGS) ×1 IMPLANT
SUT VIC AB 0 CT1 18XCR BRD8 (SUTURE) ×2 IMPLANT
SUT VIC AB 0 CT1 27 (SUTURE) ×6
SUT VIC AB 0 CT1 27XBRD ANBCTR (SUTURE) ×3 IMPLANT
SUT VIC AB 0 CT1 8-18 (SUTURE) ×6
SUT VIC AB 3-0 SH 27 (SUTURE)
SUT VIC AB 3-0 SH 27X BRD (SUTURE) IMPLANT
SUT VIC AB 4-0 KS 27 (SUTURE) ×3 IMPLANT
SUT VICRYL 0 TIES 12 18 (SUTURE) ×3 IMPLANT
SYR BULB IRRIGATION 50ML (SYRINGE) ×2 IMPLANT
SYR CONTROL 10ML LL (SYRINGE) ×3 IMPLANT
TOWEL GREEN STERILE FF (TOWEL DISPOSABLE) ×6 IMPLANT
TRAY FOLEY W/BAG SLVR 14FR (SET/KITS/TRAYS/PACK) ×3 IMPLANT

## 2019-07-28 NOTE — Discharge Instructions (Signed)
Abdominal Hysterectomy, Care After This sheet gives you information about how to care for yourself after your procedure. Your health care provider may also give you more specific instructions. If you have problems or questions, contact your health care provider. What can I expect after the procedure? After your procedure, it is common to have:  Pain.  Fatigue.  Poor appetite.  Less interest in sex.  Vaginal bleeding and discharge. You may need to use a sanitary napkin after this procedure. Follow these instructions at home: Bathing  Do not take baths, swim, or use a hot tub until your health care provider approves. Ask your health care provider if you can take showers. You may only be allowed to take sponge baths for bathing.  Keep the bandage (dressing) dry until your health care provider says it can be removed. Incision care   Follow instructions from your health care provider about how to take care of your incision. Make sure you: ? Wash your hands with soap and water before you change your bandage (dressing). If soap and water are not available, use hand sanitizer. ? Change your dressing as told by your health care provider. ? Leave stitches (sutures), skin glue, or adhesive strips in place. These skin closures may need to stay in place for 2 weeks or longer. If adhesive strip edges start to loosen and curl up, you may trim the loose edges. Do not remove adhesive strips completely unless your health care provider tells you to do that.  Check your incision area every day for signs of infection. Check for: ? Redness, swelling, or pain. ? Fluid or blood. ? Warmth. ? Pus or a bad smell. Activity  Do gentle, daily exercises as told by your health care provider. You may be told to take short walks every day and go farther each time.  Do not lift anything that is heavier than 10 lb (4.5 kg), or the limit that your health care provider tells you, until he or she says that it is  safe.  Do not drive or use heavy machinery while taking prescription pain medicine.  Do not drive for 24 hours if you were given a medicine to help you relax (sedative).  Follow your health care provider's instructions about exercise, driving, and general activities. Ask your health care provider what activities are safe for you. Lifestyle  Do not douche, use tampons, or have sex for at least 6 weeks or as told by your health care provider.  Do not drink alcohol until your health care provider approves.  Drink enough fluid to keep your urine clear or pale yellow.  Try to have someone at home with you for the first 1-2 weeks to help.  Do not use any products that contain nicotine or tobacco, such as cigarettes and e-cigarettes. These can delay healing. If you need help quitting, ask your health care provider. General instructions  Take over-the-counter and prescription medicines only as told by your health care provider.  Do not take aspirin or ibuprofen. These medicines can cause bleeding.  To prevent or treat constipation while you are taking prescription pain medicine, your health care provider may recommend that you: ? Drink enough fluid to keep your urine clear or pale yellow. ? Take over-the-counter or prescription medicines. ? Eat foods that are high in fiber, such as fresh fruits and vegetables, whole grains, and beans. ? Limit foods that are high in fat and processed sugars, such as fried and sweet foods.  Keep all   follow-up visits as told by your health care provider. This is important. Contact a health care provider if:  You have chills or fever.  You have redness, swelling, or pain around your incision.  You have fluid or blood coming from your incision.  Your incision feels warm to the touch.  You have pus or a bad smell coming from your incision.  Your incision breaks open.  You feel dizzy or light-headed.  You have pain or bleeding when you urinate.  You  have persistent diarrhea.  You have persistent nausea and vomiting.  You have abnormal vaginal discharge.  You have a rash.  You have any type of abnormal reaction or you develop an allergy to your medicine.  Your pain medicine does not help. Get help right away if:  You have a fever and your symptoms suddenly get worse.  You have severe abdominal pain.  You have shortness of breath.  You faint.  You have pain, swelling, or redness in your leg.  You have heavy vaginal bleeding with blood clots. Summary  After your procedure, it is common to have pain, fatigue and vaginal discharge.  Do not take baths, swim, or use a hot tub until your health care provider approves. Ask your health care provider if you can take showers. You may only be allowed to take sponge baths for bathing.  Follow your health care provider's instructions about exercise, driving, and general activities. Ask your health care provider what activities are safe for you.  Do not lift anything that is heavier than 10 lb (4.5 kg), or the limit that your health care provider tells you, until he or she says that it is safe.  Try to have someone at home with you for the first 1-2 weeks to help. This information is not intended to replace advice given to you by your health care provider. Make sure you discuss any questions you have with your health care provider. Document Revised: 07/29/2018 Document Reviewed: 06/13/2016 Elsevier Patient Education  2020 [employer]  

## 2019-07-28 NOTE — Anesthesia Postprocedure Evaluation (Signed)
Anesthesia Post Note  Patient: Conservation officer, nature  Procedure(s) Performed: HYSTERECTOMY ABDOMINAL WITH BILATERAL SALPINGECTOMY AND RIGHT OOPHORECTOMY (Bilateral Abdomen)     Patient location during evaluation: PACU Anesthesia Type: General Level of consciousness: awake and alert Pain management: pain level controlled Vital Signs Assessment: post-procedure vital signs reviewed and stable Respiratory status: spontaneous breathing, nonlabored ventilation, respiratory function stable and patient connected to nasal cannula oxygen Cardiovascular status: blood pressure returned to baseline and stable Postop Assessment: no apparent nausea or vomiting Anesthetic complications: no    Last Vitals:  Vitals:   07/28/19 1534 07/28/19 1830  BP: 108/73 106/61  Pulse: 82 92  Resp: 16 16  Temp: 37.2 C 37.1 C  SpO2: 100% 100%    Last Pain:  Vitals:   07/28/19 2015  TempSrc:   PainSc: 2                  Catalina Gravel

## 2019-07-28 NOTE — Op Note (Signed)
Preoperative diagnosis: Fibroids, anemia,   Postoperative diagnosis: Same  Procedure: Total abdominal hysterectomy, bilateral salpingectomy, right oophorectomy  Surgeon: Standley Dakins. Kennon Rounds, M.D.  Asst.: Lavonia Drafts, MD, An experienced assistant was required given the standard of surgical care given the complexity of the case.  This assistant was needed for exposure, dissection, suctioning, retraction, instrument exchange, and for overall help during the procedure.   Anesthesia: Lucita Lora, MD  Findings: Fibroid uterus, large fibroid in the broad ligament  Estimated blood loss: 400 cc  Specimen: Uterus and tubes to pathology, right ovary and pelvic washings  Reason for procedure: 44 y.o. [id number] with history of  Fibroid, bleeding, pain and anemia with Hgb of 7, 2 prior C-sections. The patient desired definitive treatment.  Risks of repeat hysterectomy reviewed.  Risks include but are not limited to bleeding, infection, injury to surrounding structures, including bowel, bladder and ureters, blood clots, and death.  Procedure:  She was taken to the operating room and placed in a supine position. She received 2 gm Ancef and had SCD's in place. Her abdomen and vagina were prepped and draped in the usual sterile fashion. A Foley catheter was placed which drained clear urine throughout the case. After adequate anesthesia was assured a transverse incision was made approximately 2 cm above her symphysis pubis. The incision was carried down through the subcutaneous tissue to the fascia. Bleeding encountered was cauterized with the Bovie. The fascia was scored the midline and the fascial incision was extended bilaterally. The pyramidalis muscles were separated in a transverse fashion. Hemostasis was maintained. The peritoneum was entered with hemostats and the peritoneal incision was extended bilaterally bluntly, taking care to avoid bowel and bladder. The rectus was divided bilaterally to  have enough room.The patient was placed in Trendelenburg position and her bowel was packed out of the operative field. The pelvis was inspected. Kelley clamps were used to elevate the uterus. The round ligaments were identified clamped cut and ligated.The bladder was noted to be very adherent to the front of the uterus. The left tubo-ovarian pedicle was clamped and cut and ligated. The left tube was removed and ligated as well. There was a very large fibroid noted in the right broad with the right ovary and tube adherent here. The infundibulopelvic ligament was identified on the right. They were clamped, cut, and doubly ligated with removal of tube and ovary in this process. The large fibroid was removed. The Bladder was dissected off the uterus with sharp and blunt dissection. The uterine vessels were clamped, cut and ligated. The remainder of the cervix was separated from its pelvic attachments using the same clamp, cut, ligate technique. Curved Heaney clamps were used to clamp beneath the cervix. The cervix was removed. The vaginal cuff was closed and was noted to be hemostatic. The pelvis was irrigated with 1 L of warm normal saline. All pedicles were noted to be hemostatic. The sponges were removed. The ureters were identified well out of the surgical field. The rectus muscles were inspected and hemostasis was assured. The fascia was closed with a 0 Vicryl running nonlocking suture. The subcutaneous tissue was irrigated, clean, dry. It was then infiltrated with 30 mL of 0.5% Marcaine.a subcutaneous stitch was placed.  A skin was closed with done with 4-0 vicryl on a keith needle. She tolerated the procedure well and was taken to the recovery room in stable condition. Her Foley catheter drained clear urine throughout.   [name] MD 11/09/2013, 10:54 AM

## 2019-07-28 NOTE — Anesthesia Procedure Notes (Signed)
Anesthesia Regional Block: TAP block   Pre-Anesthetic Checklist: ,, timeout performed, Correct Patient, Correct Site, Correct Laterality, Correct Procedure, Correct Position, site marked, Risks and benefits discussed,  Surgical consent,  Pre-op evaluation,  At surgeon's request and post-op pain management  Laterality: Right  Prep: chloraprep       Needles:  Injection technique: Single-shot  Needle Type: Echogenic Stimulator Needle     Needle Length: 10cm  Needle Gauge: 21     Additional Needles:   Procedures:,,,, ultrasound used (permanent image in chart),,,,  Narrative:  Start time: 07/28/2019 10:12 AM End time: 07/28/2019 10:17 AM Injection made incrementally with aspirations every 5 mL.  Performed by: Personally   Additional Notes: No pain on injection. No increased resistance to injection. Injection made in 5cc increments.  Good needle visualization.  Patient tolerated procedure well.

## 2019-07-28 NOTE — Anesthesia Procedure Notes (Signed)
Procedure Name: Intubation Date/Time: 07/28/2019 11:11 AM Performed by: Griffin Dakin, CRNA Pre-anesthesia Checklist: Patient identified, Emergency Drugs available, Suction available and Patient being monitored Patient Re-evaluated:Patient Re-evaluated prior to induction Oxygen Delivery Method: Circle system utilized Preoxygenation: Pre-oxygenation with 100% oxygen Induction Type: IV induction Ventilation: Mask ventilation without difficulty Laryngoscope Size: Mac and 3 Grade View: Grade I Tube type: Oral Tube size: 7.0 mm Number of attempts: 1 Airway Equipment and Method: Stylet and Oral airway Placement Confirmation: ETT inserted through vocal cords under direct vision,  positive ETCO2 and breath sounds checked- equal and bilateral Secured at: 21 cm Tube secured with: Tape Dental Injury: Teeth and Oropharynx as per pre-operative assessment  Comments: Intubated by Paulina Fusi, SRNA

## 2019-07-28 NOTE — Anesthesia Preprocedure Evaluation (Addendum)
Anesthesia Evaluation  Patient identified by MRN, date of birth, ID band Patient awake    Reviewed: Allergy & Precautions, NPO status , Patient's Chart, lab work & pertinent test results  Airway Mallampati: II  TM Distance: >3 FB Neck ROM: Full    Dental  (+) Teeth Intact, Dental Advisory Given   Pulmonary former smoker,    Pulmonary exam normal breath sounds clear to auscultation       Cardiovascular negative cardio ROS Normal cardiovascular exam Rhythm:Regular Rate:Normal     Neuro/Psych  Headaches, Seizures -,  PSYCHIATRIC DISORDERS Anxiety Depression Bipolar Disorder    GI/Hepatic negative GI ROS, Neg liver ROS,   Endo/Other  negative endocrine ROS  Renal/GU negative Renal ROS     Musculoskeletal negative musculoskeletal ROS (+)   Abdominal   Peds  Hematology negative hematology ROS (+)   Anesthesia Other Findings Day of surgery medications reviewed with the patient.  Reproductive/Obstetrics                            Anesthesia Physical Anesthesia Plan  ASA: II  Anesthesia Plan: General   Post-op Pain Management:  Regional for Post-op pain   Induction: Intravenous  PONV Risk Score and Plan: 4 or greater and Diphenhydramine, Scopolamine patch - Pre-op, Midazolam, Dexamethasone and Ondansetron  Airway Management Planned: Oral ETT  Additional Equipment:   Intra-op Plan:   Post-operative Plan: Extubation in OR  Informed Consent: I have reviewed the patients History and Physical, chart, labs and discussed the procedure including the risks, benefits and alternatives for the proposed anesthesia with the patient or authorized representative who has indicated his/her understanding and acceptance.     Dental advisory given  Plan Discussed with: CRNA  Anesthesia Plan Comments: (2nd PIV after induction )        Anesthesia Quick Evaluation

## 2019-07-28 NOTE — Progress Notes (Signed)
Patient arrived from PACU to room 6N20. Patient is drowsy but easily arousable. VSS. NAD. Patient is oriented to unit with call bell and belongings within reach. Patient c/o pain and PRN medication is administered. Honeycomb dressing in place to lower abdomen with scant serous drainage on bandage. Bed is in the lowest and locked position with bed rails up times 3. Bed alarm is on at this time due to patient being drowsy.

## 2019-07-28 NOTE — Transfer of Care (Signed)
Immediate Anesthesia Transfer of Care Note  Patient: Sarah Barber  Procedure(s) Performed: HYSTERECTOMY ABDOMINAL WITH BILATERAL SALPINGECTOMY AND RIGHT OOPHORECTOMY (Bilateral Abdomen)  Patient Location: PACU  Anesthesia Type:General  Level of Consciousness: drowsy  Airway & Oxygen Therapy: Patient Spontanous Breathing  Post-op Assessment: Report given to RN and Post -op Vital signs reviewed and stable  Post vital signs: Reviewed and stable  Last Vitals:  Vitals Value Taken Time  BP 118/66 07/28/19 1308  Temp    Pulse 81 07/28/19 1313  Resp 16 07/28/19 1313  SpO2 100 % 07/28/19 1313  Vitals shown include unvalidated device data.  Last Pain:  Vitals:   07/28/19 0910  TempSrc: Oral         Complications: No apparent anesthesia complications

## 2019-07-28 NOTE — Interval H&P Note (Signed)
History and Physical Interval Note:  07/28/2019 10:55 AM  Sarah Barber  has presented today for surgery, with the diagnosis of FIBROIDS.  The various methods of treatment have been discussed with the patient and family. After consideration of risks, benefits and other options for treatment, the patient has consented to  Procedure(s): HYSTERECTOMY ABDOMINAL WITH SALPINGECTOMY (Bilateral) as a surgical intervention.  The patient's history has been reviewed, patient examined, no change in status, stable for surgery.  I have reviewed the patient's chart and labs.  Questions were answered to the patient's satisfaction.     [name]

## 2019-07-28 NOTE — Anesthesia Procedure Notes (Signed)
Anesthesia Regional Block: TAP block   Pre-Anesthetic Checklist: ,, timeout performed, Correct Patient, Correct Site, Correct Laterality, Correct Procedure, Correct Position, site marked, Risks and benefits discussed,  Surgical consent,  Pre-op evaluation,  At surgeon's request and post-op pain management  Laterality: Left  Prep: chloraprep       Needles:  Injection technique: Single-shot  Needle Type: Echogenic Stimulator Needle     Needle Length: 10cm  Needle Gauge: 21     Additional Needles:   Procedures:,,,, ultrasound used (permanent image in chart),,,,  Narrative:  Start time: 07/28/2019 10:07 AM End time: 07/28/2019 10:12 AM Injection made incrementally with aspirations every 5 mL.  Performed by: Personally   Additional Notes: No pain on injection. No increased resistance to injection. Injection made in 5cc increments.  Good needle visualization.  Patient tolerated procedure well.

## 2019-07-29 ENCOUNTER — Other Ambulatory Visit: Payer: Self-pay

## 2019-07-29 LAB — BPAM RBC
Blood Product Expiration Date: 202101312359
Blood Product Expiration Date: 202102222359
Blood Product Expiration Date: 202102222359
Blood Product Expiration Date: 202102242359
ISSUE DATE / TIME: 202101191346
ISSUE DATE / TIME: 202101192332
Unit Type and Rh: 5100
Unit Type and Rh: 5100
Unit Type and Rh: 5100
Unit Type and Rh: 9500

## 2019-07-29 LAB — TYPE AND SCREEN
ABO/RH(D): O POS
Antibody Screen: POSITIVE
Donor AG Type: NEGATIVE
Donor AG Type: NEGATIVE
PT AG Type: NEGATIVE
Unit division: 0
Unit division: 0
Unit division: 0
Unit division: 0

## 2019-07-29 LAB — CBC
HCT: 24.6 % — ABNORMAL LOW (ref 36.0–46.0)
Hemoglobin: 6.9 g/dL — CL (ref 12.0–15.0)
MCH: 18.4 pg — ABNORMAL LOW (ref 26.0–34.0)
MCHC: 28 g/dL — ABNORMAL LOW (ref 30.0–36.0)
MCV: 65.4 fL — ABNORMAL LOW (ref 80.0–100.0)
Platelets: 231 10*3/uL (ref 150–400)
RBC: 3.76 MIL/uL — ABNORMAL LOW (ref 3.87–5.11)
RDW: 22.6 % — ABNORMAL HIGH (ref 11.5–15.5)
WBC: 15.6 10*3/uL — ABNORMAL HIGH (ref 4.0–10.5)
nRBC: 0 % (ref 0.0–0.2)

## 2019-07-29 NOTE — Discharge Summary (Signed)
Physician Discharge Summary  Patient ID: Sarah Barber MRN: [id number] DOB/AGE: Nov 28, 1975 44 y.o.  Admit date: 07/28/2019 Discharge date:   Admission Diagnoses:  Principal Problem:   Abnormal uterine bleeding Active Problems:   Fibroid uterus   Anemia due to acute blood loss   Status post hysterectomy   Discharge Diagnoses:  Same  Past Medical History:  Diagnosis Date  . Allergies   . Anxiety   . Bipolar disorder ([place])   . Chronic headaches   . Depression   . Fatigue   . Menstrual pain   . Panic attacks   . PTSD (post-traumatic stress disorder)   . 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 Vision changes     Surgeries: Procedure(s): HYSTERECTOMY ABDOMINAL WITH BILATERAL SALPINGECTOMY AND RIGHT OOPHORECTOMY on 07/28/2019   Consultants:   Discharged Condition: Improved  Hospital Course: Khamia Melloy is an 44 y.o. female [id number] who was admitted 07/28/2019 with a chief complaint of menorrhagia and fibroids and anemia, and found to have a diagnosis of Abnormal uterine bleeding.  They were brought to the operating room on 07/28/2019 and underwent the above named procedures.    She was given perioperative antibiotics:  Anti-infectives (From admission, onward)   Start     Dose/Rate Route Frequency Ordered Stop   07/28/19 0900  ceFAZolin (ANCEF) IVPB 2g/100 mL premix     2 g 200 mL/hr over 30 Minutes Intravenous On call to O.R. 07/28/19 0857 07/28/19 1113   07/28/19 0851  ceFAZolin (ANCEF) 2-4 GM/100ML-% IVPB    Note to Pharmacy: Laurita Quint   : cabinet override      07/28/19 0851 07/28/19 1156    .   She was given sequential compression devices, early ambulation, and chemoprophylaxis for DVT prophylaxis. Hemoglobin was 8.5 and postoperatively was 6.9. She has been this low before and bleeding will stop now.  She benefited maximally from their hospital stay and there were no complications. She was ambulating, voiding, tolerating po and  deemed stable for discharge.  Recent vital signs:  Vitals:   07/28/19 2345 07/29/19 0544  BP: 104/60 103/65  Pulse: 82 78  Resp: 16 18  Temp: 98.5 F (36.9 C) 98.4 F (36.9 C)  SpO2: 99% 100%   Discharge exam: Physical Examination: General appearance - alert, well appearing, and in no distress Chest - normal effort Heart - normal rate and regular rhythm Abdomen - soft, appropriately tender, dressing is clean and dry Extremities - peripheral pulses normal, no pedal edema, no clubbing or cyanosis, Homan's sign negative bilaterally Recent laboratory studies:  Results for orders placed or performed during the hospital encounter of 07/28/19  CBC WITH DIFFERENTIAL  Result Value Ref Range   WBC 7.4 4.0 - 10.5 K/uL   RBC 4.73 3.87 - 5.11 MIL/uL   Hemoglobin 8.5 (L) 12.0 - 15.0 g/dL   HCT 31.3 (L) 36.0 - 46.0 %   MCV 66.2 (L) 80.0 - 100.0 fL   MCH 18.0 (L) 26.0 - 34.0 pg   MCHC 27.2 (L) 30.0 - 36.0 g/dL   RDW 23.1 (H) 11.5 - 15.5 %   Platelets 252 150 - 400 K/uL   nRBC 0.0 0.0 - 0.2 %   Neutrophils Relative % 72 %   Neutro Abs 5.3 1.7 - 7.7 K/uL   Lymphocytes Relative 19 %   Lymphs Abs 1.4 0.7 - 4.0 K/uL   Monocytes Relative 5 %   Monocytes Absolute 0.4 0.1 -  1.0 K/uL   Eosinophils Relative 4 %   Eosinophils Absolute 0.3 0.0 - 0.5 K/uL   Basophils Relative 0 %   Basophils Absolute 0.0 0.0 - 0.1 K/uL   nRBC 0 0 /100 WBC   Abs Immature Granulocytes 0.00 0.00 - 0.07 K/uL   Tear Drop Cells PRESENT    Polychromasia PRESENT    Ovalocytes PRESENT   CBC  Result Value Ref Range   WBC 20.1 (H) 4.0 - 10.5 K/uL   RBC 4.18 3.87 - 5.11 MIL/uL   Hemoglobin 7.8 (L) 12.0 - 15.0 g/dL   HCT 27.2 (L) 36.0 - 46.0 %   MCV 65.1 (L) 80.0 - 100.0 fL   MCH 18.7 (L) 26.0 - 34.0 pg   MCHC 28.7 (L) 30.0 - 36.0 g/dL   RDW 23.0 (H) 11.5 - 15.5 %   Platelets 249 150 - 400 K/uL   nRBC 0.0 0.0 - 0.2 %  Creatinine, serum  Result Value Ref Range   Creatinine, Ser 0.68 0.44 - 1.00 mg/dL   GFR calc  non Af Amer >60 >60 mL/min   GFR calc Af Amer >60 >60 mL/min  CBC  Result Value Ref Range   WBC 15.6 (H) 4.0 - 10.5 K/uL   RBC 3.76 (L) 3.87 - 5.11 MIL/uL   Hemoglobin 6.9 (LL) 12.0 - 15.0 g/dL   HCT 24.6 (L) 36.0 - 46.0 %   MCV 65.4 (L) 80.0 - 100.0 fL   MCH 18.4 (L) 26.0 - 34.0 pg   MCHC 28.0 (L) 30.0 - 36.0 g/dL   RDW 22.6 (H) 11.5 - 15.5 %   Platelets 231 150 - 400 K/uL   nRBC 0.0 0.0 - 0.2 %  Pregnancy, urine POC  Result Value Ref Range   Preg Test, Ur NEGATIVE NEGATIVE  Type and screen [facility]  Result Value Ref Range   ABO/RH(D) O POS    Antibody Screen POS    Sample Expiration 07/31/2019,2359    Antibody Identification ANTI M    PT AG Type NEGATIVE FOR M ANTIGEN    Unit Number [id number]    Blood Component Type RED CELLS,LR    Unit division 00    Status of Unit REL FROM [facility]    Transfusion Status PENDING    Crossmatch Result PENDING    Unit Number [id number]    Blood Component Type RED CELLS,LR    Unit division 00    Status of Unit REL FROM [facility]    Transfusion Status PENDING    Crossmatch Result PENDING    Unit Number [id number]    Blood Component Type RED CELLS,LR    Unit division 00    Status of Unit ALLOCATED    Donor AG Type NEGATIVE FOR M ANTIGEN    Transfusion Status OK TO TRANSFUSE    Crossmatch Result COMPATIBLE    Unit Number [id number]    Blood Component Type RED CELLS,LR    Unit division 00    Status of Unit ALLOCATED    Donor AG Type NEGATIVE FOR M ANTIGEN    Transfusion Status OK TO TRANSFUSE    Crossmatch Result COMPATIBLE   Prepare RBC (crossmatch)  Result Value Ref Range   Order Confirmation      ORDER PROCESSED BY BLOOD BANK Performed at [facility], [address], [place], [place] [address]   [facility]  Result Value Ref Range   ISSUE DATE / TIME [id number]    Blood Product Unit Number [id number]  PRODUCT CODE [id number]    Unit Type and Rh 5100    Blood Product Expiration Date  202102222359    ISSUE DATE / TIME [id number]    Blood Product Unit Number [id number]    PRODUCT CODE [id number]    Unit Type and Rh 5100    Blood Product Expiration Date [id number]    Blood Product Unit Number [id number]    PRODUCT CODE [id number]    Unit Type and Rh 9500    Blood Product Expiration Date [id number]    Blood Product Unit Number [id number]    PRODUCT CODE [id number]    Unit Type and Rh 5100    Blood Product Expiration Date [id number]     Discharge Medications:   Allergies as of 07/29/2019      Reactions   Other Rash   pineapple,cabbage      Medication List    STOP taking these medications   megestrol 40 MG tablet Commonly known as: MEGACE     TAKE these medications   ferrous sulfate 325 (65 FE) MG tablet Take 1 tablet (325 mg total) by mouth daily.   oxyCODONE 5 MG immediate release tablet Commonly known as: Oxy IR/ROXICODONE Take 1 tablet (5 mg total) by mouth every 6 (six) hours as needed for severe pain.       Diagnostic Studies: No results found.  Disposition: Discharge disposition: 01-Home or Self Care       Discharge Instructions     Remove dressing in 72 hours   Complete by: As directed    Call MD for:  persistant nausea and vomiting   Complete by: As directed    Call MD for:  redness, tenderness, or signs of infection (pain, swelling, redness, odor or green/yellow discharge around incision site)   Complete by: As directed    Call MD for:  severe uncontrolled pain   Complete by: As directed    Call MD for:  temperature >100.4   Complete by: As directed    Diet - low sodium heart healthy   Complete by: As directed    Driving Restrictions   Complete by: As directed    None while taking narcotic pain meds   Increase activity slowly   Complete by: As directed    Lifting restrictions   Complete by: As directed    Nothing > 20 lbs x 6 weeks   Sexual Activity Restrictions   Complete by: As directed    None x 6 weeks       Follow-up [place] for [facility] In 2 weeks.   Specialty: Obstetrics and Gynecology Why: postop check, they will call you with an appointment Contact information: [address] [place], [place] [id number] [place] [phone]           Signed: [name] 07/29/2019, 7:51 AM

## 2019-07-29 NOTE — Progress Notes (Signed)
CRITICAL VALUE ALERT  Critical Value:Hemoglobin 6.9  Date & Time Notied: 07/29/19 [id number]  Provider Notified:Yes (Dr Jalene Mullet)  Orders Received/Actions taken: None

## 2019-07-30 LAB — CYTOLOGY - NON PAP

## 2019-08-04 LAB — SURGICAL PATHOLOGY

## 2019-08-21 ENCOUNTER — Encounter: Payer: Self-pay | Admitting: Family Medicine

## 2019-08-21 ENCOUNTER — Ambulatory Visit (INDEPENDENT_AMBULATORY_CARE_PROVIDER_SITE_OTHER): Payer: Self-pay | Admitting: Family Medicine

## 2019-08-21 ENCOUNTER — Other Ambulatory Visit: Payer: Self-pay

## 2019-08-21 VITALS — BP 106/69 | HR 94 | Wt 173.1 lb

## 2019-08-21 DIAGNOSIS — K219 Gastro-esophageal reflux disease without esophagitis: Secondary | ICD-10-CM

## 2019-08-21 DIAGNOSIS — Z90710 Acquired absence of both cervix and uterus: Secondary | ICD-10-CM

## 2019-08-21 DIAGNOSIS — D62 Acute posthemorrhagic anemia: Secondary | ICD-10-CM

## 2019-08-21 DIAGNOSIS — Z48816 Encounter for surgical aftercare following surgery on the genitourinary system: Secondary | ICD-10-CM

## 2019-08-21 LAB — CBC
Hematocrit: 28.4 % — ABNORMAL LOW (ref 34.0–46.6)
Hemoglobin: 7.7 g/dL — ABNORMAL LOW (ref 11.1–15.9)
MCH: 17.5 pg — ABNORMAL LOW (ref 26.6–33.0)
MCHC: 27.1 g/dL — ABNORMAL LOW (ref 31.5–35.7)
MCV: 65 fL — ABNORMAL LOW (ref 79–97)
Platelets: 514 10*3/uL — ABNORMAL HIGH (ref 150–450)
RBC: 4.4 x10E6/uL (ref 3.77–5.28)
RDW: 21.1 % — ABNORMAL HIGH (ref 11.7–15.4)
WBC: 7.6 10*3/uL (ref 3.4–10.8)

## 2019-08-21 MED ORDER — ESOMEPRAZOLE MAGNESIUM 20 MG PO CPDR: 20.0000 mg | DELAYED_RELEASE_CAPSULE | Freq: Every day | ORAL | 1 refills | Status: DC

## 2019-08-21 NOTE — Assessment & Plan Note (Signed)
Doing well--may return to work in 1-2 wks, if feels up to it. No heavy lifting x 3 more weeks.

## 2019-08-21 NOTE — Progress Notes (Signed)
   Subjective:    Patient ID: Sarah Barber is a 44 y.o. female presenting with Post-op Follow-up  on 08/21/2019  HPI: Here for post op check. Underwent TAH on 1/19. Doing well. Honeycomb removed today. Pain is much improved. Notes some nausea with some foods and occasional emesis. Bowel and bladder function is normal. Notes some dizziness, and has h/o anemia.  Review of Systems  Constitutional: Negative for chills and fever.  Respiratory: Negative for shortness of breath.   Cardiovascular: Negative for chest pain.  Gastrointestinal: Positive for nausea and vomiting. Negative for abdominal pain.  Genitourinary: Negative for dysuria.  Skin: Negative for rash.  Neurological: Positive for dizziness.      Objective:    BP 106/69   Pulse 94   Wt 173 lb 1.6 oz (78.5 kg)   LMP 07/09/2019   BMI 26.32 kg/m  Physical Exam Constitutional:      General: She is not in acute distress.    Appearance: She is well-developed.  HENT:     Head: Normocephalic and atraumatic.  Eyes:     General: No scleral icterus. Cardiovascular:     Rate and Rhythm: Normal rate.  Pulmonary:     Effort: Pulmonary effort is normal.  Abdominal:     Palpations: Abdomen is soft.  Musculoskeletal:     Cervical back: Neck supple.  Skin:    General: Skin is warm and dry.     Comments: Steri-strips in place--incision is well healed.  Neurological:     Mental Status: She is alert and oriented to person, place, and time.         Assessment & Plan:   Problem List Items Addressed This Visit      Unprioritized   Anemia due to acute blood loss - Primary    Check counts.      Relevant Orders   CBC   Status post hysterectomy    Doing well--may return to work in 1-2 wks, if feels up to it. No heavy lifting x 3 more weeks.       Other Visit Diagnoses    Gastroesophageal reflux disease without esophagitis       Relevant Medications   esomeprazole (NEXIUM) 20 MG capsule      Return in about 3  weeks (around 09/11/2019) for virtual, post op check.  [name] 08/21/2019 4:07 PM

## 2019-08-21 NOTE — Patient Instructions (Signed)
Abdominal Hysterectomy, Care After This sheet gives you information about how to care for yourself after your procedure. Your doctor may also give you more specific instructions. If you have problems or questions, contact your doctor. Follow these instructions at home: Bathing  Do not take baths, swim, or use a hot tub until your doctor says it is okay. Ask your doctor if you can take showers. You may only be allowed to take sponge baths for bathing.  Keep the bandage (dressing) dry until your doctor says it can be taken off. Surgical cut (incision) care      Follow instructions from your doctor about how to take care of your cut from surgery. Make sure you: ? Wash your hands with soap and water before you change your bandage (dressing). If you cannot use soap and water, use hand sanitizer. ? Change your bandage as told by your doctor. ? Leave stitches (sutures), skin glue, or skin tape (adhesive) strips in place. They may need to stay in place for 2 weeks or longer. If tape strips get loose and curl up, you may trim the loose edges. Do not remove tape strips completely unless your doctor says it is okay.  Check your surgical cut area every day for signs of infection. Check for: ? Redness, swelling, or pain. ? Fluid or blood. ? Warmth. ? Pus or a bad smell. Activity  Do gentle, daily exercise as told by your doctor. You may be told to take short walks every day and go farther each time.  Do not lift anything that is heavier than 10 lb (4.5 kg), or the limit that your doctor tells you, until he or she says that it is safe.  Do not drive or use heavy machinery while taking prescription pain medicine.  Do not drive for 24 hours if you were given a medicine to help you relax (sedative).  Follow your doctor's advice about exercise, driving, and general activities. Ask your doctor what activities are safe for you. Lifestyle   Do not douche, use tampons, or have sex for at least 6 weeks  or as told by your doctor.  Do not drink alcohol until your doctor says it is okay.  Drink enough fluid to keep your pee (urine) clear or pale yellow.  Try to have someone at home with you for the first 1-2 weeks to help.  Do not use any products that contain nicotine or tobacco, such as cigarettes and e-cigarettes. These can slow down healing. If you need help quitting, ask your doctor. General instructions  Take over-the-counter and prescription medicines only as told by your doctor.  Do not take aspirin or ibuprofen. These medicines can cause bleeding.  To prevent or treat constipation while you are taking prescription pain medicine, your doctor may suggest that you: ? Drink enough fluid to keep your urine clear or pale yellow. ? Take over-the-counter or prescription medicines. ? Eat foods that are high in fiber, such as:  Fresh fruits and vegetables.  Whole grains.  Beans. ? Limit foods that are high in fat and processed sugars, such as fried and sweet foods.  Keep all follow-up visits as told by your doctor. This is important. Contact a doctor if:  You have chills or fever.  You have redness, swelling, or pain around your cut.  You have fluid or blood coming from your cut.  Your cut feels warm to the touch.  You have pus or a bad smell coming from your cut.    Your cut breaks open.  You feel dizzy or light-headed.  You have pain or bleeding when you pee.  You keep having watery poop (diarrhea).  You keep feeling sick to your stomach (nauseous) or keep throwing up (vomiting).  You have unusual fluid (discharge) coming from your vagina.  You have a rash.  You have a reaction to your medicine.  Your pain medicine does not help. Get help right away if:  You have a fever and your symptoms get worse all of a sudden.  You have very bad belly (abdominal) pain.  You are short of breath.  You pass out (faint).  You have pain, swelling, or redness of your  leg.  You bleed a lot from your vagina and notice clumps of blood (clots). Summary  Do not take baths, swim, or use a hot tub until your doctor says it is okay. Ask your doctor if you can take showers. You may only be allowed to take sponge baths for bathing.  Follow your doctor's advice about exercise, driving, and general activities. Ask your doctor what activities are safe for you.  Do not lift anything that is heavier than 10 lb (4.5 kg), or the limit that your doctor tells you, until he or she says that it is safe.  Try to have someone at home with you for the first 1-2 weeks to help. This information is not intended to replace advice given to you by your health care provider. Make sure you discuss any questions you have with your health care provider. Document Revised: 08/28/2018 Document Reviewed: 06/13/2016 Elsevier Patient Education  2020 Elsevier Inc.  

## 2019-08-21 NOTE — Assessment & Plan Note (Signed)
Check counts.

## 2019-08-24 ENCOUNTER — Encounter: Payer: Self-pay | Admitting: General Practice

## 2019-08-24 ENCOUNTER — Other Ambulatory Visit: Payer: Self-pay | Admitting: General Practice

## 2019-08-27 DIAGNOSIS — Z029 Encounter for administrative examinations, unspecified: Secondary | ICD-10-CM

## 2019-09-23 ENCOUNTER — Encounter: Payer: Self-pay | Admitting: Family Medicine

## 2019-09-23 ENCOUNTER — Telehealth (INDEPENDENT_AMBULATORY_CARE_PROVIDER_SITE_OTHER): Payer: Self-pay | Admitting: Family Medicine

## 2019-09-23 ENCOUNTER — Encounter: Payer: Self-pay | Admitting: General Practice

## 2019-09-23 DIAGNOSIS — Z09 Encounter for follow-up examination after completed treatment for conditions other than malignant neoplasm: Secondary | ICD-10-CM

## 2019-09-23 NOTE — Progress Notes (Signed)
Opened in error

## 2019-09-23 NOTE — Progress Notes (Signed)
409pm-- called patient, no answer- left message stating we are trying to call you for your appt today. Please log onto mychart for your visit.  I connected with  Jeri Modena on 09/23/19 at  4:15 PM EDT by telephone and verified that I am speaking with the correct person using two identifiers.   I discussed the limitations, risks, security and privacy concerns of performing an evaluation and management service by telephone and the availability of in person appointments. I also discussed with the patient that there may be a patient responsible charge related to this service. The patient expressed understanding and agreed to proceed.  Derinda Late, RN 09/23/2019  4:31 PM   Patient has elevated phq9 & gad7- offered for her to see [name] and patient would like an appt. Will be scheduled by front office.

## 2019-09-23 NOTE — Progress Notes (Signed)
    GYNECOLOGY VIRTUAL VISIT ENCOUNTER NOTE  Provider location: [facility] [place] [facility] [place]   I connected with Sarah Barber on 09/23/19 at  4:15 PM EDT by MyChart Video Encounter at home and verified that I am speaking with the correct person using two identifiers.   I discussed the limitations, risks, security and privacy concerns of performing an evaluation and management service virtually and the availability of in person appointments. I also discussed with the patient that there may be a patient responsible charge related to this service. The patient expressed understanding and agreed to proceed.   History:  Sarah Barber is a 44 y.o. [phone] female being evaluated today for post op check. She is still having headache. She denies any abnormal vaginal discharge, bleeding, pelvic pain or other concerns.       Past Medical History:  Diagnosis Date  . Allergies   . Anxiety   . Bipolar disorder ([place])   . Chronic headaches   . Depression   . Fatigue   . Menstrual pain   . Panic attacks   . PTSD (post-traumatic stress disorder)   . Seizure [facility])    as a child - last one at age 26- petite mal  . Vision changes    Past Surgical History:  Procedure Laterality Date  . CESAREAN SECTION      x 2  . HYSTERECTOMY ABDOMINAL WITH SALPINGECTOMY Bilateral 07/28/2019   Procedure: HYSTERECTOMY ABDOMINAL WITH BILATERAL SALPINGECTOMY AND RIGHT OOPHORECTOMY;  Surgeon: [name], MD;  Location: [place];  Service: Gynecology;  Laterality: Bilateral;   The following portions of the patient's history were reviewed and updated as appropriate: allergies, current medications, past family history, past medical history, past social history, past surgical history and problem list.   Health Maintenance:  Normal pap and negative HRHPV on 04/2018.    Review of Systems:  Pertinent items noted in HPI and remainder of comprehensive ROS otherwise negative.  Physical Exam:   Incision: Well  healed, no erythema  General:  Alert, oriented and cooperative. Patient appears to be in no acute distress.  Mental Status: Normal mood and affect. Normal behavior. Normal judgment and thought content.   Respiratory: Normal respiratory effort, no problems with respiration noted  Rest of physical exam deferred due to type of encounter  Labs and Imaging No results found for this or any previous visit (from the past 336 hour(s)). No results found.     Assessment and Plan:     Postop check - Doing well--ok to return to normal activities.        I discussed the assessment and treatment plan with the patient. The patient was provided an opportunity to ask questions and all were answered. The patient agreed with the plan and demonstrated an understanding of the instructions.   The patient was advised to call back or seek an in-person evaluation/go to the ED if the symptoms worsen or if the condition fails to improve as anticipated.  I provided 7 minutes of face-to-face time during this encounter.   [name], MD [facility] for [employer], [place]

## 2019-10-05 NOTE — BH Specialist Note (Signed)
Integrated Behavioral Health via Telemedicine Video Visit  10/05/2019 Alexandriah Ginder [id number]  Number of [place] visits: 1 Session Start time: 1:50  Session End time: 2:45 Total time: 4   Referring Provider: Darron Doom, MD Type of Visit: Video Patient/Family location: Home [facility] Provider location: [place] All persons participating in visit: Patient Denae [place] and [place]    Confirmed patient's address: Yes  Confirmed patient's phone number: Yes  Any changes to demographics: No   Confirmed patient's insurance: Yes  Any changes to patient's insurance: No   Discussed confidentiality: Yes   I connected with Jeri Modena by a video enabled telemedicine application and verified that I am speaking with the correct person using two identifiers.     I discussed the limitations of evaluation and management by telemedicine and the availability of in person appointments.  I discussed that the purpose of this visit is to provide behavioral health care while limiting exposure to the novel coronavirus.   Discussed there is a possibility of technology failure and discussed alternative modes of communication if that failure occurs.  I discussed that engaging in this video visit, they consent to the provision of behavioral healthcare and the services will be billed under their insurance.  Patient and/or legal guardian expressed understanding and consented to video visit: Yes   PRESENTING CONCERNS: Patient and/or family reports the following symptoms/concerns: Pt states her primary concern is life stress regarding uncertainty of future plans and unmet  life goals; pt says it helps to talk through her feelings.  Duration of problem: Increasing in recent months; Severity of problem: moderate  STRENGTHS (Protective Factors/Coping Skills): Good social support, self-awareness, future-oriented, and open to positive change  GOALS ADDRESSED: Patient will: 1.   Reduce symptoms of: anxiety, depression and stress  2.  Increase knowledge and/or ability of: stress reduction  3.  Demonstrate ability to: Increase healthy adjustment to current life circumstances and Increase motivation to adhere to plan of care  INTERVENTIONS: Interventions utilized:  Solution-Focused Strategies and Psychoeducation and/or Health Education Standardized Assessments completed: GAD-7 and PHQ 9  ASSESSMENT: Patient currently experiencing Adjustment disorder with mixed depression and anxiety and Psychosocial stress  Patient may benefit from psychoeducation and brief therapeutic interventions regarding coping with symptoms of anxiety and depression, related to current life stressors .  PLAN: 1. Follow up with behavioral health clinician on : Two weeks 2. Behavioral recommendations:  -Continue with plan to pay for and attend [employer] class on Monday, April 5 -Consider simplifying, eliminating, and/or delegating one or more household chores through the 6-8 weeks of classes (ex. Use paper plates, etc.) -Consider apps, as discussed, for at least 5 minutes of self-care moments daily 3. Referral(s): [facility] [facility] Clinic)  I discussed the assessment and treatment plan with the patient and/or parent/guardian. They were provided an opportunity to ask questions and all were answered. They agreed with the plan and demonstrated an understanding of the instructions.   They were advised to call back or seek an in-person evaluation if the symptoms worsen or if the condition fails to improve as anticipated.  Caroleen Hamman Nahun Kronberg  Depression screen [facility] 2/9 09/23/2019  Decreased Interest 0  Down, Depressed, Hopeless 1  PHQ - 2 Score 1  Altered sleeping 3  Tired, decreased energy 0  Change in appetite 3  Feeling bad or failure about yourself  1  Trouble concentrating 1  Moving slowly or fidgety/restless 3  Suicidal thoughts 0  PHQ-9 Score  12   GAD 7  : Generalized Anxiety Score 09/23/2019  Nervous, Anxious, on Edge 1  Control/stop worrying 3  Worry too much - different things 3  Trouble relaxing 0  Restless 3  Easily annoyed or irritable 1  Afraid - awful might happen 0  Total GAD 7 Score 11

## 2019-10-06 ENCOUNTER — Ambulatory Visit (INDEPENDENT_AMBULATORY_CARE_PROVIDER_SITE_OTHER): Payer: Self-pay | Admitting: Clinical

## 2019-10-06 ENCOUNTER — Other Ambulatory Visit: Payer: Self-pay

## 2019-10-06 DIAGNOSIS — F4323 Adjustment disorder with mixed anxiety and depressed mood: Secondary | ICD-10-CM

## 2019-10-20 ENCOUNTER — Ambulatory Visit: Payer: Medicaid Other | Admitting: Clinical

## 2019-10-20 ENCOUNTER — Other Ambulatory Visit: Payer: Self-pay

## 2019-10-20 DIAGNOSIS — Z91199 Patient's noncompliance with other medical treatment and regimen due to unspecified reason: Secondary | ICD-10-CM

## 2019-10-20 DIAGNOSIS — Z5329 Procedure and treatment not carried out because of patient's decision for other reasons: Secondary | ICD-10-CM

## 2019-10-20 NOTE — BH Specialist Note (Signed)
Pt did not arrive to video visit and did not answer the phone ; Left HIPPA-compliant message to call back [name] from [facility] for [place] at [phone].  ; left MyChart message for patient.    Sarah Barber via Telemedicine Video Visit  10/20/2019 Sarah Barber [id number]  Garlan Fair

## 2019-10-28 NOTE — BH Specialist Note (Signed)
Integrated Behavioral Health via Telemedicine Phone Visit  10/28/2019 Amarachukwu Arington [id number]  Number of [place] visits: 2 Session Start time: 3:20  Session End time: 4:05 Total time: 35   Referring Provider: Darron Doom, MD Type of Visit: Phone Patient/Family location: Home [facility] Provider location: [facility] All persons participating in visit: Patient Khori Greaney and [place]    Confirmed patient's address: Yes  Confirmed patient's phone number: Yes  Any changes to demographics: No   Confirmed patient's insurance: Yes  Any changes to patient's insurance: No   Discussed confidentiality: at previous visit  I connected with Jeri Modena  by a video enabled telemedicine application and verified that I am speaking with the correct person using two identifiers.     I discussed the limitations of evaluation and management by telemedicine and the availability of in person appointments.  I discussed that the purpose of this visit is to provide behavioral health care while limiting exposure to the novel coronavirus.   Discussed there is a possibility of technology failure and discussed alternative modes of communication if that failure occurs.  I discussed that engaging in this video visit, they consent to the provision of behavioral healthcare and the services will be billed under their insurance.  Patient and/or legal guardian expressed understanding and consented to video visit: Yes   PRESENTING CONCERNS: Patient and/or family reports the following symptoms/concerns: Pt states her primary concern today is feeling down, attributed to grief over loss of 54yo cousin, along with life stress (job dissatisfaction, interpersonal conflict, children's messy rooms, and indecision regarding upcoming event).  Duration of problem: Increase in past month; Severity of problem: moderate  STRENGTHS (Protective Factors/Coping Skills): Good social support,  self-awareness, future-oriented; open to positive changes  GOALS ADDRESSED: Patient will: 1.  Reduce symptoms of: anxiety, depression and stress  2.  Increase knowledge and/or ability of: stress reduction  3.  Demonstrate ability to: Increase healthy adjustment to current life circumstances and Begin healthy grieving over loss  INTERVENTIONS: Interventions utilized:  Supportive Counseling Standardized Assessments completed: Not Needed  ASSESSMENT: Patient currently experiencing Grief, Adjustment disorder with mixed depression and anxiety and Psychosocial stress.   Patient may benefit from continued psychoeducation and brief therapeutic interventions regarding coping with symptoms of depression, anxiety, grief and life stress .  PLAN: 1. Follow up with behavioral health clinician on : One month 2. Behavioral recommendations:  -Allow time and space to grieve loss -Continue plan to return to work (while also making plans to attend next set of real estate classes coming up) -Focus on creating a non-cluttered, peaceful space in your own bedroom. (No children's items allowed) -Consider taking children's clothing out of their room. Instead of asking what they want to get rid of, ask them to narrow items down to a specific number to keep in their room(example: 50 items). Put the rest in boxes or donate.  -Consider [employer] classes for elderly aunt: share information with her.  3. Referral(s): Integrated Orth[facility]ist (In Clinic) and Community Resources:  [employer]  I discussed the assessment and treatment plan with the patient and/or parent/guardian. They were provided an opportunity to ask questions and all were answered. They agreed with the plan and demonstrated an understanding of the instructions.   They were advised to call back or seek an in-person evaluation if the symptoms worsen or if the condition fails to improve as  anticipated.  Caroleen Hamman Tamya Denardo

## 2019-10-29 ENCOUNTER — Ambulatory Visit (INDEPENDENT_AMBULATORY_CARE_PROVIDER_SITE_OTHER): Payer: Self-pay | Admitting: Clinical

## 2019-10-29 DIAGNOSIS — F4323 Adjustment disorder with mixed anxiety and depressed mood: Secondary | ICD-10-CM

## 2019-11-18 NOTE — BH Specialist Note (Signed)
Integrated Behavioral Health via Telemedicine Video Visit  11/18/2019 Tegra Groen [id number]  Number of [place] visits: 3 Session Start time: 3:46  Session End time: 4:48 Total time: 22  Referring Provider: Darron Doom, MD Type of Visit: Video Patient/Family location: Home [facility] Provider location: [facility] [place] [facility]  All persons participating in visit: Patient Sarah Barber and [place]    Confirmed patient's address: Yes  Confirmed patient's phone number: Yes  Any changes to demographics: No   Confirmed patient's insurance: Yes  Any changes to patient's insurance: No   Discussed confidentiality: at previous visit   by a video enabled telemedicine application and verified that I am speaking with the correct person using two identifiers.     I discussed the limitations of evaluation and management by telemedicine and the availability of in person appointments.  I discussed that the purpose of this visit is to provide behavioral health care while limiting exposure to the novel coronavirus.   Discussed there is a possibility of technology failure and discussed alternative modes of communication if that failure occurs.  I discussed that engaging in this video visit, they consent to the provision of behavioral healthcare and the services will be billed under their insurance.  Patient and/or legal guardian expressed understanding and consented to video visit: Yes   PRESENTING CONCERNS: Patient and/or family reports the following symptoms/concerns: Pt  goal today is to work through conflicting feelings regarding boundaries with adult children and uncertainty about future work and big life goals.  Duration of problem: Ongoing; Severity of problem: moderate  STRENGTHS (Protective Factors/Coping Skills): Good social support, self-awareness, future-oriented, and open to positive changes  GOALS  ADDRESSED: Patient will: 1.  Reduce symptoms of: anxiety, depression and stress  2.  Increase knowledge and/or ability of: stress reduction  3.  Demonstrate ability to: Increase healthy adjustment to current life circumstances  INTERVENTIONS: Interventions utilized:  Brief CBT and Supportive Counseling Standardized Assessments completed: Not Needed  ASSESSMENT: Patient currently experiencing Adjustment disorder with mixed depression and anxiety and Psychosocial stress.   Patient may benefit from continued psychoeducation and brief therapeutic interventions regarding coping with symptoms of anxiety and depression with current life stress .  PLAN: 1. Follow up with behavioral health clinician on : As needed, call [name] at [phone] to set up follow up visits, as needed 2. Behavioral recommendations:  -Begin savings plan to start moving forward towards home ownership and other big life goals -Continue using self-coping strategies that have been helpful in the past -Continue to set healthy boundaries with adult children -Continue with plan to attend real estate classes in the future 3. Referral(s): [place] (In Clinic)  I discussed the assessment and treatment plan with the patient and/or parent/guardian. They were provided an opportunity to ask questions and all were answered. They agreed with the plan and demonstrated an understanding of the instructions.   They were advised to call back or seek an in-person evaluation if the symptoms worsen or if the condition fails to improve as anticipated.  Caroleen Hamman Ladonne Sharples  Depression screen [facility] 2/9 09/23/2019  Decreased Interest 0  Down, Depressed, Hopeless 1  PHQ - 2 Score 1  Altered sleeping 3  Tired, decreased energy 0  Change in appetite 3  Feeling bad or failure about yourself  1  Trouble concentrating 1  Moving slowly or fidgety/restless 3  Suicidal thoughts 0  PHQ-9 Score 12   GAD 7 :  Generalized  Anxiety Score 09/23/2019  Nervous, Anxious, on Edge 1  Control/stop worrying 3  Worry too much - different things 3  Trouble relaxing 0  Restless 3  Easily annoyed or irritable 1  Afraid - awful might happen 0  Total GAD 7 Score 11

## 2019-11-26 ENCOUNTER — Ambulatory Visit (INDEPENDENT_AMBULATORY_CARE_PROVIDER_SITE_OTHER): Payer: Self-pay | Admitting: Clinical

## 2019-11-26 DIAGNOSIS — F4323 Adjustment disorder with mixed anxiety and depressed mood: Secondary | ICD-10-CM

## 2020-04-30 ENCOUNTER — Emergency Department (HOSPITAL_COMMUNITY): Payer: Medicaid Other

## 2020-04-30 ENCOUNTER — Other Ambulatory Visit: Payer: Self-pay

## 2020-04-30 ENCOUNTER — Emergency Department (HOSPITAL_COMMUNITY)
Admission: EM | Admit: 2020-04-30 | Discharge: 2020-04-30 | Disposition: A | Discharge status: Home / Self Care | Payer: Medicaid Other | Attending: Emergency Medicine | Admitting: Emergency Medicine

## 2020-04-30 ENCOUNTER — Encounter (HOSPITAL_COMMUNITY): Payer: Self-pay | Admitting: Emergency Medicine

## 2020-04-30 DIAGNOSIS — U071 COVID-19: Secondary | ICD-10-CM | POA: Diagnosis not present

## 2020-04-30 DIAGNOSIS — Z87891 Personal history of nicotine dependence: Secondary | ICD-10-CM | POA: Diagnosis not present

## 2020-04-30 DIAGNOSIS — R509 Fever, unspecified: Secondary | ICD-10-CM | POA: Diagnosis present

## 2020-04-30 LAB — RESPIRATORY PANEL BY RT PCR (FLU A&B, COVID)
Influenza A by PCR: NEGATIVE
Influenza B by PCR: NEGATIVE
SARS Coronavirus 2 by RT PCR: POSITIVE — AB

## 2020-04-30 LAB — COMPREHENSIVE METABOLIC PANEL
ALT: 11 U/L (ref 0–44)
AST: 19 U/L (ref 15–41)
Albumin: 3.4 g/dL — ABNORMAL LOW (ref 3.5–5.0)
Alkaline Phosphatase: 65 U/L (ref 38–126)
Anion gap: 14 (ref 5–15)
BUN: 8 mg/dL (ref 6–20)
CO2: 23 mmol/L (ref 22–32)
Calcium: 8.9 mg/dL (ref 8.9–10.3)
Chloride: 98 mmol/L (ref 98–111)
Creatinine, Ser: 0.76 mg/dL (ref 0.44–1.00)
GFR, Estimated: >60 mL/min (ref >60)
Glucose, Bld: 105 mg/dL — ABNORMAL HIGH (ref 70–99)
Potassium: 2.8 mmol/L — ABNORMAL LOW (ref 3.5–5.1)
Sodium: 135 mmol/L (ref 135–145)
Total Bilirubin: 1.3 mg/dL — ABNORMAL HIGH (ref 0.3–1.2)
Total Protein: 7.5 g/dL (ref 6.5–8.1)

## 2020-04-30 LAB — CBC WITH DIFFERENTIAL/PLATELET
Abs Immature Granulocytes: 0.02 10*3/uL (ref 0.00–0.07)
Basophils Absolute: 0 10*3/uL (ref 0.0–0.1)
Basophils Relative: 1 %
Eosinophils Absolute: 0 10*3/uL (ref 0.0–0.5)
Eosinophils Relative: 0 %
HCT: 36.5 % (ref 36.0–46.0)
Hemoglobin: 11.3 g/dL — ABNORMAL LOW (ref 12.0–15.0)
Immature Granulocytes: 0 %
Lymphocytes Relative: 16 %
Lymphs Abs: 1 10*3/uL (ref 0.7–4.0)
MCH: 23.8 pg — ABNORMAL LOW (ref 26.0–34.0)
MCHC: 31 g/dL (ref 30.0–36.0)
MCV: 76.8 fL — ABNORMAL LOW (ref 80.0–100.0)
Monocytes Absolute: 0.5 10*3/uL (ref 0.1–1.0)
Monocytes Relative: 7 %
Neutro Abs: 4.7 10*3/uL (ref 1.7–7.7)
Neutrophils Relative %: 76 %
Platelets: 213 10*3/uL (ref 150–400)
RBC: 4.75 MIL/uL (ref 3.87–5.11)
RDW: 19.2 % — ABNORMAL HIGH (ref 11.5–15.5)
WBC: 6.2 10*3/uL (ref 4.0–10.5)
nRBC: 0 % (ref 0.0–0.2)

## 2020-04-30 LAB — LACTIC ACID, PLASMA: Lactic Acid, Venous: 1 mmol/L (ref 0.5–1.9)

## 2020-04-30 MED ORDER — METOCLOPRAMIDE HCL 5 MG/ML IJ SOLN
10.0000 mg | Freq: Once | INTRAMUSCULAR | Status: AC
  Administered 2020-04-30: 10 mg via INTRAVENOUS
  Filled 2020-04-30: qty 2

## 2020-04-30 MED ORDER — KETOROLAC TROMETHAMINE 30 MG/ML IJ SOLN
15.0000 mg | Freq: Once | INTRAMUSCULAR | Status: AC
  Administered 2020-04-30: 15 mg via INTRAVENOUS
  Filled 2020-04-30: qty 1

## 2020-04-30 MED ORDER — ACETAMINOPHEN 325 MG PO TABS
650.0000 mg | ORAL_TABLET | Freq: Once | ORAL | Status: AC | PRN
  Administered 2020-04-30: 650 mg via ORAL
  Filled 2020-04-30: qty 2

## 2020-04-30 MED ORDER — POTASSIUM CHLORIDE CRYS ER 20 MEQ PO TBCR
40.0000 meq | EXTENDED_RELEASE_TABLET | Freq: Once | ORAL | Status: AC
  Administered 2020-04-30: 40 meq via ORAL
  Filled 2020-04-30: qty 2

## 2020-04-30 MED ORDER — SODIUM CHLORIDE 0.9 % IV BOLUS
1000.0000 mL | Freq: Once | INTRAVENOUS | Status: AC
  Administered 2020-04-30: 1000 mL via INTRAVENOUS

## 2020-04-30 MED ORDER — ONDANSETRON 4 MG PO TBDP: 4.0000 mg | ORAL_TABLET | Freq: Three times a day (TID) | ORAL | 0 refills | Status: DC | PRN

## 2020-04-30 MED ORDER — POTASSIUM CHLORIDE 10 MEQ/100ML IV SOLN
10.0000 meq | Freq: Once | INTRAVENOUS | Status: AC
  Administered 2020-04-30: 10 meq via INTRAVENOUS
  Filled 2020-04-30: qty 100

## 2020-04-30 NOTE — ED Provider Notes (Signed)
[facility] DEPT Provider Note   CSN: [id number] Arrival date & time: 04/30/20  0136     History Chief Complaint  Patient presents with  . Fever    Sarah Barber is a 44 y.o. female.  44 year old female with a history of bipolar disorder, chronic headaches, PTSD presents to the emergency department for evaluation of illness.  States that she began to feel unwell approximately 1 week ago.  Initially had onset of chills, subjective fever, body aches and general malaise.  Has since developed nausea, vomiting, diarrhea.  Reports inability to tolerate food or fluids secondary to persistent nausea and vomiting.  She last vomited prior to EMS transport.  Has not noted any blood in her emesis or in her stool.  Complains of a global headache with photophobia at this time.  Was taking naproxen for symptom management, but has not had this in the past several days because she has been concerned about taking it on an empty stomach.  Has not been vaccinated against COVID-19.  Does endorse potential exposure.  Was tested for COVID on the 20th, but has not received her test results.  The history is provided by the patient. No language interpreter was used.  Fever Associated symptoms: cough, diarrhea, myalgias, nausea, sore throat and vomiting        Past Medical History:  Diagnosis Date  . Allergies   . Anxiety   . Bipolar disorder ([place])   . Chronic headaches   . Depression   . Fatigue   . Menstrual pain   . Panic attacks   . PTSD (post-traumatic stress disorder)   . 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 Vision changes     Patient Active Problem List   Diagnosis Date Noted  . Status post hysterectomy 07/28/2019  . Anemia due to acute blood loss 07/23/2019  . Fibroid uterus 04/27/2019  . Family history of breast cancer in first degree relative 04/27/2019  . Abnormal uterine bleeding 04/27/2019    Past Surgical History:   Procedure Laterality Date  . CESAREAN SECTION      x 2  . HYSTERECTOMY ABDOMINAL WITH SALPINGECTOMY Bilateral 07/28/2019   Procedure: HYSTERECTOMY ABDOMINAL WITH BILATERAL SALPINGECTOMY AND RIGHT OOPHORECTOMY;  Surgeon: [name], MD;  Location: [place];  Service: Gynecology;  Laterality: Bilateral;     OB History    Gravida  8   Para  5   Term  4   Preterm  1   AB  3   Living  5     SAB  3   TAB      Ectopic      Multiple      Live Births  5           Family History  Problem Relation Age of Onset  . Diabetes Mother   . Cerebrovascular Accident Father        x2  . Diabetes Maternal Grandmother   . Kidney disease Maternal Grandmother   . Lung cancer Maternal Grandfather     Social History   Tobacco Use  . Smoking status: Former Smoker    Types: Cigarettes    Quit date: 09/2015    Years since quitting: 4.6  . Smokeless tobacco: Never Used  . Tobacco comment: smoked off and on  Vaping Use  . Vaping Use: Never used  Substance Use Topics  . Alcohol use: Yes  .  Drug use: Never    Home Medications Prior to Admission medications   Medication Sig Start Date End Date Taking? Authorizing Provider  ondansetron (ZOFRAN ODT) 4 MG disintegrating tablet Take 1 tablet (4 mg total) by mouth every 8 (eight) hours as needed for nausea or vomiting. 04/30/20   Antonietta Breach, PA-C    Allergies    No known allergies, Pineapple, and Other  Review of Systems   Review of Systems  Constitutional: Positive for fatigue and fever.  HENT: Positive for sore throat.   Respiratory: Positive for cough.   Gastrointestinal: Positive for diarrhea, nausea and vomiting.  Musculoskeletal: Positive for myalgias.  Neurological: Negative for syncope.    Physical Exam Updated Vital Signs BP 106/77   Pulse 87   Temp 99.7 F (37.6 C) (Oral)   Resp 15   Ht 5\' 8"  (1.727 m)   Wt 81.6 kg   LMP 07/09/2019 (Exact Date)   SpO2 97%   BMI 27.37 kg/m   Physical Exam Vitals  and nursing note reviewed.  Constitutional:      General: She is not in acute distress.    Appearance: She is well-developed. She is not diaphoretic.     Comments: Nontoxic appearing  HENT:     Head: Normocephalic and atraumatic.  Eyes:     General: No scleral icterus.    Conjunctiva/sclera: Conjunctivae normal.  Neck:     Comments: No meningismus Cardiovascular:     Rate and Rhythm: Regular rhythm. Tachycardia present.     Pulses: Normal pulses.     Comments: Mild tachycardia Pulmonary:     Effort: Pulmonary effort is normal. No respiratory distress.     Breath sounds: No stridor. No wheezing.     Comments: Respirations even and unlabored. No cough appreciated. Musculoskeletal:        General: Normal range of motion.     Cervical back: Normal range of motion.  Skin:    General: Skin is warm and dry.     Coloration: Skin is not pale.     Findings: No erythema or rash.  Neurological:     Mental Status: She is alert and oriented to person, place, and time.     Coordination: Coordination normal.  Psychiatric:        Behavior: Behavior normal.     ED Results / Procedures / Treatments   Labs (all labs ordered are listed, but only abnormal results are displayed) Labs Reviewed  RESPIRATORY PANEL BY RT PCR (FLU A&B, COVID) - Abnormal; Notable for the following components:      Result Value   SARS Coronavirus 2 by RT PCR POSITIVE (*)    All other components within normal limits  COMPREHENSIVE METABOLIC PANEL - Abnormal; Notable for the following components:   Potassium 2.8 (*)    Glucose, Bld 105 (*)    Albumin 3.4 (*)    Total Bilirubin 1.3 (*)    All other components within normal limits  CBC WITH DIFFERENTIAL/PLATELET - Abnormal; Notable for the following components:   Hemoglobin 11.3 (*)    MCV 76.8 (*)    MCH 23.8 (*)    RDW 19.2 (*)    All other components within normal limits  LACTIC ACID, PLASMA    EKG None  Radiology DG Chest 2 View  Result Date:  04/30/2020 CLINICAL DATA:  Fever, nausea, vomiting, diarrhea, and general malaise for 1 week. EXAM: CHEST - 2 VIEW COMPARISON:  06/05/2016 FINDINGS: The heart size and mediastinal contours are  within normal limits. Both lungs are clear. The visualized skeletal structures are unremarkable. IMPRESSION: No active cardiopulmonary disease. Electronically Signed   By: Lucienne Capers M.D.   On: 04/30/2020 03:22    Procedures Procedures (including critical care time)  Medications Ordered in ED Medications  acetaminophen (TYLENOL) tablet 650 mg (650 mg Oral Given 04/30/20 0257)  sodium chloride 0.9 % bolus 1,000 mL (0 mLs Intravenous Stopped 04/30/20 0507)  metoCLOPramide (REGLAN) injection 10 mg (10 mg Intravenous Given 04/30/20 0255)  ketorolac (TORADOL) 30 MG/ML injection 15 mg (15 mg Intravenous Given 04/30/20 0255)  potassium chloride SA (KLOR-CON) CR tablet 40 mEq (40 mEq Oral Given 04/30/20 0336)  potassium chloride 10 mEq in 100 mL IVPB (0 mEq Intravenous Stopped 04/30/20 0508)    ED Course  I have reviewed the triage vital signs and the nursing notes.  Pertinent labs & imaging results that were available during my care of the patient were reviewed by me and considered in my medical decision making (see chart for details).  Clinical Course as of May 01 603  Sat Apr 30, 2020  0259 Oral and IV potassium ordered given hypokalemia   [KH]  0520 States symptoms have improved.  Denies headache.  Is presently tolerating water.  Pending respiratory panel.  Vitals stable and anticipate discharge with instructions for outpatient symptom control.   [KH]  0532 COVID positive. Not candidate for MAB infusion. Will give Rx for Zofran and refer to PCP for f/u.   [KH]    Clinical Course User Index [KH] Beverely Pace   MDM Rules/Calculators/A&P                          44 year old Female Presenting to the ED for symptoms of COVID-19.  Did test positive for COVID-19 while in the ED.   Low-grade fever has improved with antipyretics.  Complains of persistent nausea, vomiting, diarrhea.  Now tolerating oral fluids following Reglan.  Has no respiratory distress, hypoxia.  Is ambulatory in the ED without oxygen desaturation.  Overall, on reassessment, the patient states that she is feeling better.  Will discharge with instructions for supportive care.  Return precautions discussed and provided. Patient discharged in stable condition with no unaddressed concerns.  Adely Facer was evaluated in Emergency Department on 04/30/2020 for the symptoms described in the history of present illness. She was evaluated in the context of the global COVID-19 pandemic, which necessitated consideration that the patient might be at risk for infection with the SARS-CoV-2 virus that causes COVID-19. Institutional protocols and algorithms that pertain to the evaluation of patients at risk for COVID-19 are in a state of rapid change based on information released by regulatory bodies including the CDC and federal and state organizations. These policies and algorithms were followed during the patient's care in the ED.   Final Clinical Impression(s) / ED Diagnoses Final diagnoses:  COVID-19 virus infection    Rx / DC Orders ED Discharge Orders         Ordered    ondansetron (ZOFRAN ODT) 4 MG disintegrating tablet  Every 8 hours PRN        04/30/20 0540           Antonietta Breach, PA-C 04/30/20 0606    Malvin Johns, MD 04/30/20 [phone]

## 2020-04-30 NOTE — Discharge Instructions (Addendum)
Your COVID test today is POSITIVE. Quarantine away from other individuals until symptom-free for 3 days. Take tylenol for fever, headaches, body aches. Drink plenty of fluids to prevent dehydration. You may continue to use other over-the-counter remedies for symptom control, if desired. Return for new or concerning symptoms such as worsening shortness of breath, coughing up blood, persistent vomiting, loss of consciousness.

## 2020-04-30 NOTE — ED Notes (Signed)
Pt requested to provide a urine sample. She is unable to attempt at this time.

## 2020-04-30 NOTE — ED Notes (Signed)
Patient to the lobby with a steady gait and belongings.

## 2020-04-30 NOTE — ED Notes (Signed)
Assumed care of patient at this time, nad noted, sr up x2, bed locked and low, call bell w/I reach.  Will continue to monitor. ° °

## 2020-04-30 NOTE — ED Triage Notes (Signed)
Pt to ER from home via EMS complaining of fever, N/V/D and general malaise x 1 week.  Pt reports that she was tested for COVID 19 on the 20th, but the results have not come back yet.

## 2020-04-30 NOTE — ED Notes (Signed)
Pt ambulated in 95 percent on room air with no difficulty.

## 2020-04-30 NOTE — ED Notes (Signed)
Ice water and ginger ale has been provided for fluid challenge.

## 2020-05-01 ENCOUNTER — Encounter: Payer: Self-pay | Admitting: Oncology

## 2020-05-01 ENCOUNTER — Telehealth: Payer: Self-pay | Admitting: Oncology

## 2020-05-01 ENCOUNTER — Telehealth: Payer: Self-pay | Admitting: Nurse Practitioner

## 2020-05-01 NOTE — Telephone Encounter (Signed)
Re: [facility] Infusion  Called to Discuss with patient about Covid symptoms and the use of regeneron, a monoclonal antibody infusion for those with mild to moderate Covid symptoms and at a high risk of hospitalization.     Pt is qualified for this infusion at the [facility] center due to co-morbid conditions and/or a member of an at-risk group.    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 Left VM, MCM and test message.   Rulon Abide, AGNP-C [phone] ([place])

## 2020-05-01 NOTE — Telephone Encounter (Signed)
Called to discuss with patient about Covid symptoms and the use of casirivimab/imdevimab, a monoclonal antibody infusion for those with mild to moderate Covid symptoms and at a high risk of hospitalization.  Pt is qualified for this infusion at the [facility] infusion center due to; Specific high risk criteria : BMI > 25 and Other high risk medical condition per CDC:  SVI   Message left to call back our hotline [phone].  A MyChart message was also sent to the patient's MyChart account.  Walden Field, NP [facility]

## 2020-06-10 ENCOUNTER — Encounter: Payer: Self-pay | Admitting: General Practice

## 2020-07-27 ENCOUNTER — Other Ambulatory Visit: Payer: Self-pay | Admitting: Physician Assistant

## 2020-07-27 DIAGNOSIS — N644 Mastodynia: Secondary | ICD-10-CM

## 2020-09-07 ENCOUNTER — Other Ambulatory Visit: Payer: Medicaid Other

## 2020-10-25 ENCOUNTER — Ambulatory Visit
Admission: RE | Admit: 2020-10-25 | Discharge: 2020-10-25 | Disposition: A | Discharge status: Home / Self Care | Payer: Medicaid Other | Source: Ambulatory Visit | Attending: Physician Assistant | Admitting: Physician Assistant

## 2020-10-25 ENCOUNTER — Other Ambulatory Visit: Payer: Self-pay

## 2020-10-25 ENCOUNTER — Ambulatory Visit: Payer: Medicaid Other

## 2020-10-25 DIAGNOSIS — N644 Mastodynia: Secondary | ICD-10-CM

## 2021-08-31 ENCOUNTER — Other Ambulatory Visit: Payer: Self-pay

## 2021-08-31 ENCOUNTER — Encounter (HOSPITAL_COMMUNITY): Payer: Self-pay | Admitting: *Deleted

## 2021-08-31 ENCOUNTER — Emergency Department (HOSPITAL_COMMUNITY)
Admission: EM | Admit: 2021-08-31 | Discharge: 2021-09-01 | Disposition: A | Discharge status: Home / Self Care | Payer: Medicaid Other | Attending: Emergency Medicine | Admitting: Emergency Medicine

## 2021-08-31 DIAGNOSIS — R519 Headache, unspecified: Secondary | ICD-10-CM | POA: Diagnosis present

## 2021-08-31 DIAGNOSIS — G43909 Migraine, unspecified, not intractable, without status migrainosus: Secondary | ICD-10-CM | POA: Diagnosis not present

## 2021-08-31 DIAGNOSIS — G43409 Hemiplegic migraine, not intractable, without status migrainosus: Secondary | ICD-10-CM

## 2021-08-31 DIAGNOSIS — Z87891 Personal history of nicotine dependence: Secondary | ICD-10-CM | POA: Diagnosis not present

## 2021-08-31 NOTE — ED Triage Notes (Signed)
Headache since Tuesday, constant. Nausea, dizziness.

## 2021-09-01 MED ORDER — SODIUM CHLORIDE 0.9 % IV BOLUS
1000.0000 mL | Freq: Once | INTRAVENOUS | Status: AC
  Administered 2021-09-01: 1000 mL via INTRAVENOUS

## 2021-09-01 MED ORDER — METOCLOPRAMIDE HCL 5 MG/ML IJ SOLN
10.0000 mg | Freq: Once | INTRAMUSCULAR | Status: AC
  Administered 2021-09-01: 10 mg via INTRAVENOUS
  Filled 2021-09-01: qty 2

## 2021-09-01 MED ORDER — DIPHENHYDRAMINE HCL 50 MG/ML IJ SOLN
25.0000 mg | Freq: Once | INTRAMUSCULAR | Status: AC
  Administered 2021-09-01: 25 mg via INTRAVENOUS
  Filled 2021-09-01: qty 1

## 2021-09-01 MED ORDER — KETOROLAC TROMETHAMINE 15 MG/ML IJ SOLN
15.0000 mg | Freq: Once | INTRAMUSCULAR | Status: AC
  Administered 2021-09-01: 15 mg via INTRAVENOUS
  Filled 2021-09-01: qty 1

## 2021-09-01 NOTE — ED Provider Notes (Signed)
[facility] DEPT Provider Note: Georgena Spurling, MD, FACEP  CSN: [id number] MRN: [id number] ARRIVAL: 08/31/21 at 2330 ROOM: Advance  Headache   HISTORY OF PRESENT ILLNESS  09/01/21 1:27 AM Sarah Barber is a 46 y.o. female with a history of headaches.  She is here with a headache since Tuesday.  She rates the pain as a 10 out of 10.  The pain is located on the left side of her head and the occipital region.  It is similar to previous headaches, but she has never been given a formal diagnosis of migraines.  She has had associated nausea and retching as well as photophobia.  She has taken naproxen without relief.   Past Medical History:  Diagnosis Date   Allergies    Anxiety    Bipolar disorder (HCC)    Chronic headaches    Depression    Fatigue    Menstrual pain    Panic attacks    PTSD (post-traumatic stress disorder)    Seizure ([place])    as a child - last one at age 52- petite mal   Vision changes     Past Surgical History:  Procedure Laterality Date   CESAREAN SECTION      x 2   HYSTERECTOMY ABDOMINAL WITH SALPINGECTOMY Bilateral 07/28/2019   Procedure: HYSTERECTOMY ABDOMINAL WITH BILATERAL SALPINGECTOMY AND RIGHT OOPHORECTOMY;  Surgeon: [name], MD;  Location: [facility];  Service: Gynecology;  Laterality: Bilateral;    Family History  Problem Relation Age of Onset   Diabetes Mother    Cerebrovascular Accident Father        x2   Diabetes Maternal Grandmother    Kidney disease Maternal Grandmother    Lung cancer Maternal Grandfather     Social History   Tobacco Use   Smoking status: Former    Types: Cigarettes    Quit date: 09/2015    Years since quitting: 5.9   Smokeless tobacco: Never   Tobacco comments:    smoked off and on  Vaping Use   Vaping Use: Never used  Substance Use Topics   Alcohol use: Yes   Drug use: Never    Prior to Admission medications   Not on File    Allergies No known allergies, Pineapple, and  Other   REVIEW OF SYSTEMS  Negative except as noted here or in the History of Present Illness.   PHYSICAL EXAMINATION  Initial Vital Signs Blood pressure 118/67, pulse 73, temperature 97.7 F (36.5 C), temperature source Oral, resp. rate 16, height 5\' 8"  (1.727 m), weight 83.9 kg, last menstrual period 07/09/2019, SpO2 94 %.  Examination General: Well-developed, well-nourished female in no acute distress; appearance consistent with age of record HENT: normocephalic; atraumatic Eyes: Photophobia Neck: supple Heart: regular rate and rhythm Lungs: clear to auscultation bilaterally Abdomen: soft; nondistended; nontender; bowel sounds present Extremities: No deformity; full range of motion Neurologic: Awake, alert and oriented; motor function intact in all extremities and symmetric; no facial droop Skin: Warm and dry Psychiatric: Normal mood and affect   RESULTS  Summary of this visit's results, reviewed and interpreted by myself:   EKG Interpretation  Date/Time:    Ventricular Rate:    PR Interval:    QRS Duration:   QT Interval:    QTC Calculation:   R Axis:     Text Interpretation:         Laboratory Studies: No results found for this or any previous visit (from the  past 24 hour(s)). Imaging Studies: No results found.  ED COURSE and MDM  Nursing notes, initial and subsequent vitals signs, including pulse oximetry, reviewed and interpreted by myself.  Vitals:   09/01/21 0106 09/01/21 0130 09/01/21 0200 09/01/21 0300  BP: 118/67 127/84 100/67 120/90  Pulse: 73 91 65 75  Resp: 16 18 18 20   Temp:      TempSrc:      SpO2: 94% 98% 98% 99%  Weight:      Height:       Medications  sodium chloride 0.9 % bolus 1,000 mL (0 mLs Intravenous Stopped 09/01/21 0227)  diphenhydrAMINE (BENADRYL) injection 25 mg (25 mg Intravenous Given 09/01/21 0148)  metoCLOPramide (REGLAN) injection 10 mg (10 mg Intravenous Given 09/01/21 0148)  ketorolac (TORADOL) 15 MG/ML injection 15  mg (15 mg Intravenous Given 09/01/21 0147)   3:02 AM Patient feeling better after IV fluids and medications.  She is calling her son to pick her up but she is ready to go home.  Presentation is consistent with a migraine although she has never had a formal diagnosis of migraines.   PROCEDURES  Procedures   ED DIAGNOSES     ICD-10-CM   1. Sporadic migraine  G43.[place]          Gerrod Maule, MD 09/01/21 [phone]

## 2021-09-25 ENCOUNTER — Ambulatory Visit: Payer: Medicaid Other | Admitting: Psychiatry

## 2021-09-25 ENCOUNTER — Encounter: Payer: Self-pay | Admitting: Psychiatry

## 2021-09-25 ENCOUNTER — Other Ambulatory Visit: Payer: Self-pay

## 2021-09-25 VITALS — BP 122/84 | Ht 68.5 in | Wt 201.0 lb

## 2021-09-25 DIAGNOSIS — G43109 Migraine with aura, not intractable, without status migrainosus: Secondary | ICD-10-CM | POA: Diagnosis not present

## 2021-09-25 MED ORDER — ZOLMITRIPTAN 5 MG PO TABS: 5.0000 mg | ORAL_TABLET | ORAL | 3 refills | Status: DC | PRN

## 2021-09-25 MED ORDER — TOPIRAMATE 25 MG PO TABS: ORAL_TABLET | ORAL | 3 refills | Status: DC
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
## 2021-09-25 NOTE — Patient Instructions (Addendum)
Increase Topamax to 50 mg (2 pills) at bedtime for one week, then take 75 mg (3 pills) at bedtime for one week, then take 100 mg (4 pills) at bedtime ? ?Start Zomig as needed for migraine. May repeat a dose in 2 hours if headache persists. Please do not take more than 2 days per week. Do not take Maxalt while taking this medication. ? ?Let me know once CT scan is done and I will look at it ? ? ?

## 2021-09-28 ENCOUNTER — Telehealth: Payer: Self-pay

## 2021-09-28 ENCOUNTER — Other Ambulatory Visit: Payer: Self-pay | Admitting: Endocrinology

## 2021-09-28 DIAGNOSIS — Z1231 Encounter for screening mammogram for malignant neoplasm of breast: Secondary | ICD-10-CM

## 2021-09-28 NOTE — Telephone Encounter (Signed)
PA for zolmitriptan has been approved. ? ?PA Case: [id number], Status: Approved, Coverage Starts on: 09/28/2021 12:00:00 AM, Coverage Ends on: 09/28/2022 12:00:00 AM. ?

## 2021-09-28 NOTE — Telephone Encounter (Signed)
(  Key: [id number]) ? ?Your information has been sent to [place] [employer]. ? ?PA has been sent.  ?

## 2021-10-31 ENCOUNTER — Ambulatory Visit
Admission: RE | Admit: 2021-10-31 | Discharge: 2021-10-31 | Disposition: A | Discharge status: Home / Self Care | Payer: Medicaid Other | Source: Ambulatory Visit | Attending: Endocrinology | Admitting: Endocrinology

## 2021-10-31 DIAGNOSIS — Z1231 Encounter for screening mammogram for malignant neoplasm of breast: Secondary | ICD-10-CM

## 2021-12-18 ENCOUNTER — Other Ambulatory Visit: Payer: Self-pay

## 2021-12-18 ENCOUNTER — Emergency Department (HOSPITAL_BASED_OUTPATIENT_CLINIC_OR_DEPARTMENT_OTHER): Payer: Medicaid Other | Admitting: Radiology

## 2021-12-18 ENCOUNTER — Emergency Department (HOSPITAL_BASED_OUTPATIENT_CLINIC_OR_DEPARTMENT_OTHER)
Admission: EM | Admit: 2021-12-18 | Discharge: 2021-12-18 | Disposition: A | Discharge status: Home / Self Care | Payer: Medicaid Other | Attending: Emergency Medicine | Admitting: Emergency Medicine

## 2021-12-18 ENCOUNTER — Encounter (HOSPITAL_BASED_OUTPATIENT_CLINIC_OR_DEPARTMENT_OTHER): Payer: Self-pay | Admitting: *Deleted

## 2021-12-18 DIAGNOSIS — M25561 Pain in right knee: Secondary | ICD-10-CM | POA: Diagnosis present

## 2021-12-18 MED ORDER — KETOROLAC TROMETHAMINE 60 MG/2ML IM SOLN
60.0000 mg | Freq: Once | INTRAMUSCULAR | Status: AC
  Administered 2021-12-18: 60 mg via INTRAMUSCULAR
  Filled 2021-12-18: qty 2

## 2021-12-18 MED ORDER — NAPROXEN 375 MG PO TABS: 375.0000 mg | ORAL_TABLET | Freq: Two times a day (BID) | ORAL | 0 refills | Status: DC

## 2021-12-18 MED ORDER — NAPROXEN 250 MG PO TABS: 500.0000 mg | ORAL_TABLET | ORAL | Status: DC

## 2021-12-18 MED ORDER — LIDOCAINE 5 % EX PTCH: 1.0000 | MEDICATED_PATCH | CUTANEOUS | 0 refills | Status: DC

## 2021-12-18 MED ORDER — TRAMADOL HCL 50 MG PO TABS
50.0000 mg | ORAL_TABLET | ORAL | Status: AC
  Administered 2021-12-18: 50 mg via ORAL
  Filled 2021-12-18: qty 1

## 2021-12-18 MED ORDER — LIDOCAINE 5 % EX PTCH
2.0000 | MEDICATED_PATCH | CUTANEOUS | Status: DC
  Administered 2021-12-18: 2 via TRANSDERMAL
  Filled 2021-12-18: qty 2

## 2021-12-18 NOTE — ED Triage Notes (Signed)
C/o right knee pain. States it normally "pops in and out" states she stood up tonight and she felt a "pop" states she was not able to bear weight on right knee. Swelling to right knee noted. Pt was brought in by [facility]. Was given 100 mcg of fentanyl pta by ems.

## 2021-12-18 NOTE — ED Notes (Signed)
Pt verbalizes understanding of discharge instructions. Opportunity for questioning and answers were provided. Pt discharged from ED to home with son.    
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
## 2021-12-20 ENCOUNTER — Ambulatory Visit (HOSPITAL_BASED_OUTPATIENT_CLINIC_OR_DEPARTMENT_OTHER): Payer: Medicaid Other | Admitting: Orthopaedic Surgery

## 2021-12-20 DIAGNOSIS — M25361 Other instability, right knee: Secondary | ICD-10-CM

## 2021-12-20 NOTE — Progress Notes (Signed)
Chief Complaint: Right knee pain     History of Present Illness:    Sarah Barber is a 46 y.o. female presents today with ongoing right knee pain in the setting of frequent patellar dislocations.  She states that she first experienced a patellar dislocation on the right leg after the birth of her first child which was nearly 13 years prior.  She did have a previous injury when she was 46 years old on this right knee although she did not have subsequent pain and instability.  She states that ever since the initial dislocation this has been happening more frequently.  This is not happening up to 2 times a week where she will need to straighten the knee in order to reduce it.  She denies any baseline swelling in the knee.  She did recently have a very significant dislocation event that resulted in significant pain with limited ability to bear weight.  She went to the emergency room on June 12 was subsequently referred here for further assessment.  She is experiencing pain and swelling in the knee.  She was provided crutches.  She has been taking naproxen at home.  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  She has not been able to do this as result of her knee pain.    Surgical History:   None  PMH/PSH/Family History/Social History/Meds/Allergies:    Past Medical History:  Diagnosis Date   Allergies    Anxiety    Bipolar disorder (HCC)    Chronic headaches    Depression    Fatigue    Menstrual pain    Panic attacks    PTSD (post-traumatic stress disorder)    Seizure ([place])    as a child - last one at age 39- petite mal   Vision changes    Past Surgical History:  Procedure Laterality Date   CESAREAN SECTION      x 2   HYSTERECTOMY ABDOMINAL WITH SALPINGECTOMY Bilateral 07/28/2019   Procedure: HYSTERECTOMY ABDOMINAL WITH BILATERAL SALPINGECTOMY AND RIGHT OOPHORECTOMY;  Surgeon: [name], MD;  Location: [facility];  Service: Gynecology;   Laterality: Bilateral;   Social History   Socioeconomic History   Marital status: Single    Spouse name: Not on file   Number of children: Not on file   Years of education: Not on file   Highest education level: Not on file  Occupational History   Not on file  Tobacco Use   Smoking status: Former    Types: Cigarettes    Quit date: 09/2015    Years since quitting: 6.2   Smokeless tobacco: Never   Tobacco comments:    smoked off and on  Vaping Use   Vaping Use: Never used  Substance and Sexual Activity   Alcohol use: Yes    Comment: occasional   Drug use: Never   Sexual activity: Not on file  Other Topics Concern   Not on file  Social History Narrative   Right handed    Caffeine- rare    Social Determinants of Health   Financial Resource Strain: Not on file  Food Insecurity: Not on file  Transportation Needs: Not on file  Physical Activity: Not on file  Stress: Not on file  Social Connections: Not on file   Family  History  Problem Relation Age of Onset   Diabetes Mother    Cerebrovascular Accident Father        x2   Diabetes Maternal Grandmother    Kidney disease Maternal Grandmother    Lung cancer Maternal Grandfather    Breast cancer Neg Hx    Allergies  Allergen Reactions   No Known Allergies Other (See Comments)   Pineapple Nausea And Vomiting   Other Rash    pineapple,cabbage   Current Outpatient Medications  Medication Sig Dispense Refill   butalbital-acetaminophen-caffeine (FIORICET) 50-325-40 MG tablet Take by mouth 2 (two) times daily as needed for headache.     lidocaine (LIDODERM) 5 % Place 1 patch onto the skin daily. Remove & Discard patch within 12 hours or as directed by MD 30 patch 0   naproxen (NAPROSYN) 375 MG tablet Take 1 tablet (375 mg total) by mouth 2 (two) times daily with a meal. 10 tablet 0   naproxen (NAPROSYN) 500 MG tablet Take 500 mg by mouth 2 (two) times daily.     topiramate (TOPAMAX) 25 MG tablet Take 50 mg at bedtime  for one week, then increase to 75 mg at bedtime for one week, then increase to 100 mg at bedtime 120 tablet 3   zolmitriptan (ZOMIG) 5 MG tablet Take 1 tablet (5 mg total) by mouth as needed for migraine. 10 tablet 3   No current facility-administered medications for this visit.   No results found.  Review of Systems:   A ROS was performed including pertinent positives and negatives as documented in the HPI.  Physical Exam :   Constitutional: NAD and appears stated age Neurological: Alert and oriented Psych: Appropriate affect and cooperative Last menstrual period 07/09/2019.   Comprehensive Musculoskeletal Exam:      Musculoskeletal Exam  Gait Normal  Alignment Normal   Right Left  Inspection Normal Normal  Palpation    Tenderness Patellofemoral None  Crepitus None None  Effusion Moderate None  Range of Motion    Extension 0 with positive apprehension 0  Flexion 90 135  Strength    Extension 5/5 5/5  Flexion 5/5 5/5  Ligament Exam     Generalized Laxity No No  Lachman Negative Negative   Pivot Shift Negative Negative  Anterior Drawer Negative Negative  Valgus at 0 Negative Negative  Valgus at 20 Negative Negative  Varus at 0 0 0  Varus at 20   0 0  Posterior Drawer at 90 0 0  Vascular/Lymphatic Exam    Edema None None  Venous Stasis Changes No No  Distal Circulation Normal Normal  Neurologic    Light Touch Sensation Intact Intact  Special Tests: Positive patella apprehension.  Unable to assess quadrant laxity due to apprehension and swelling     Imaging:   Xray (4 views right knee): Normal   I personally reviewed and interpreted the radiographs.   Assessment:   46 y.o. female with right knee pain in the setting of multiple recurrent patellar instability.  Given the fact that she does very significant patellar instability at this time I do believe possible discussion of surgical intervention is warranted.  With that being said I do believe an MRI would  be the next step to rule out any type of chondral injury particularly given the fact that she is having significant pain and swelling in the knee with limited range of motion and inability to bear weight at today's visit.  At this time we will plan  for MRI of the right knee and follow-up to discuss results  Plan :    -Plan for MRI right knee and follow-up to discuss results     I personally saw and evaluated the patient, and participated in the management and treatment plan.  Vanetta Mulders, MD Attending Physician, Orthopedic Surgery  This document was dictated using Dragon voice recognition software. A reasonable attempt at proof reading has been made to minimize errors.

## 2021-12-22 ENCOUNTER — Ambulatory Visit
Admission: RE | Admit: 2021-12-22 | Discharge: 2021-12-22 | Disposition: A | Discharge status: Home / Self Care | Payer: Medicaid Other | Source: Ambulatory Visit | Attending: Orthopaedic Surgery | Admitting: Orthopaedic Surgery

## 2021-12-22 DIAGNOSIS — M25361 Other instability, right knee: Secondary | ICD-10-CM

## 2021-12-24 ENCOUNTER — Other Ambulatory Visit: Payer: Medicaid Other

## 2021-12-29 ENCOUNTER — Ambulatory Visit (INDEPENDENT_AMBULATORY_CARE_PROVIDER_SITE_OTHER): Payer: Medicaid Other | Admitting: Orthopaedic Surgery

## 2021-12-29 ENCOUNTER — Ambulatory Visit (HOSPITAL_BASED_OUTPATIENT_CLINIC_OR_DEPARTMENT_OTHER): Payer: Self-pay | Admitting: Orthopaedic Surgery

## 2021-12-29 DIAGNOSIS — M25361 Other instability, right knee: Secondary | ICD-10-CM

## 2021-12-29 DIAGNOSIS — M1220 Villonodular synovitis (pigmented), unspecified site: Secondary | ICD-10-CM

## 2021-12-29 MED ORDER — IBUPROFEN 800 MG PO TABS: 800.0000 mg | ORAL_TABLET | Freq: Three times a day (TID) | ORAL | 0 refills | Status: AC

## 2021-12-29 MED ORDER — ASPIRIN 325 MG PO TBEC: 325.0000 mg | DELAYED_RELEASE_TABLET | Freq: Every day | ORAL | 0 refills | Status: AC

## 2021-12-29 MED ORDER — OXYCODONE HCL 5 MG PO CAPS: 5.0000 mg | ORAL_CAPSULE | ORAL | 0 refills | Status: DC | PRN

## 2021-12-29 MED ORDER — ACETAMINOPHEN 500 MG PO TABS: 500.0000 mg | ORAL_TABLET | Freq: Three times a day (TID) | ORAL | 0 refills | Status: AC

## 2021-12-29 NOTE — H&P (View-Only) (Signed)
Chief Complaint: Right knee pain     History of Present Illness:   12/29/2021: Sarah Barber presents today for MRI follow-up of her right knee.  At this time she states that she is having significant weakness in the right knee.  She feels like she cannot extend at the right knee.  She is also noticed that she is not able to strengthen and fire her quadriceps muscle.  Denies any recurrent patellar instability since this last injury.  She has been in a compressive knee sleeve.  She is here today for MRI follow-up.  She has been limited and dependent on crutches and a wheelchair.  Her children have been helping her out at home as well.  Sarah Barber is a 46 y.o. female presents today with ongoing right knee pain in the setting of frequent patellar dislocations.  She states that she first experienced a patellar dislocation on the right leg after the birth of her first child which was nearly 13 years prior.  She did have a previous injury when she was 46 years old on this right knee although she did not have subsequent pain and instability.  She states that ever since the initial dislocation this has been happening more frequently.  This is not happening up to 2 times a week where she will need to straighten the knee in order to reduce it.  She denies any baseline swelling in the knee.  She did recently have a very significant dislocation event that resulted in significant pain with limited ability to bear weight.  She went to the emergency room on June 12 was subsequently referred here for further assessment.  She is experiencing pain and swelling in the knee.  She was provided crutches.  She has been taking naproxen at home.  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  She has not been able to do this as result of her knee pain.    Surgical History:   None  PMH/PSH/Family History/Social History/Meds/Allergies:    Past Medical History:  Diagnosis Date   Allergies     Anxiety    Bipolar disorder (HCC)    Chronic headaches    Depression    Fatigue    Menstrual pain    Panic attacks    PTSD (post-traumatic stress disorder)    Seizure ([place])    as a child - last one at age 2- petite mal   Vision changes    Past Surgical History:  Procedure Laterality Date   CESAREAN SECTION      x 2   HYSTERECTOMY ABDOMINAL WITH SALPINGECTOMY Bilateral 07/28/2019   Procedure: HYSTERECTOMY ABDOMINAL WITH BILATERAL SALPINGECTOMY AND RIGHT OOPHORECTOMY;  Surgeon: [name], MD;  Location: [facility];  Service: Gynecology;  Laterality: Bilateral;   Social History   Socioeconomic History   Marital status: Single    Spouse name: Not on file   Number of children: Not on file   Years of education: Not on file   Highest education level: Not on file  Occupational History   Not on file  Tobacco Use   Smoking status: Former    Types: Cigarettes    Quit date: 09/2015    Years since quitting: 6.3   Smokeless tobacco: Never   Tobacco comments:    smoked off and  on  Vaping Use   Vaping Use: Never used  Substance and Sexual Activity   Alcohol use: Yes    Comment: occasional   Drug use: Never   Sexual activity: Not on file  Other Topics Concern   Not on file  Social History Narrative   Right handed    Caffeine- rare    Social Determinants of Health   Financial Resource Strain: Not on file  Food Insecurity: Not on file  Transportation Needs: Not on file  Physical Activity: Not on file  Stress: Not on file  Social Connections: Not on file   Family History  Problem Relation Age of Onset   Diabetes Mother    Cerebrovascular Accident Father        x2   Diabetes Maternal Grandmother    Kidney disease Maternal Grandmother    Lung cancer Maternal Grandfather    Breast cancer Neg Hx    Allergies  Allergen Reactions   No Known Allergies Other (See Comments)   Pineapple Nausea And Vomiting   Other Rash    pineapple,cabbage   Current Outpatient  Medications  Medication Sig Dispense Refill   acetaminophen (TYLENOL) 500 MG tablet Take 1 tablet (500 mg total) by mouth every 8 (eight) hours for 10 days. 30 tablet 0   aspirin EC 325 MG tablet Take 1 tablet (325 mg total) by mouth daily. 30 tablet 0   ibuprofen (ADVIL) 800 MG tablet Take 1 tablet (800 mg total) by mouth every 8 (eight) hours for 10 days. Please take with food, please alternate with acetaminophen 30 tablet 0   oxycodone (OXY-IR) 5 MG capsule Take 1 capsule (5 mg total) by mouth every 4 (four) hours as needed (severe pain). 20 capsule 0   butalbital-acetaminophen-caffeine (FIORICET) 50-325-40 MG tablet Take by mouth 2 (two) times daily as needed for headache.     lidocaine (LIDODERM) 5 % Place 1 patch onto the skin daily. Remove & Discard patch within 12 hours or as directed by MD 30 patch 0   naproxen (NAPROSYN) 375 MG tablet Take 1 tablet (375 mg total) by mouth 2 (two) times daily with a meal. 10 tablet 0   naproxen (NAPROSYN) 500 MG tablet Take 500 mg by mouth 2 (two) times daily.     topiramate (TOPAMAX) 25 MG tablet Take 50 mg at bedtime for one week, then increase to 75 mg at bedtime for one week, then increase to 100 mg at bedtime 120 tablet 3   zolmitriptan (ZOMIG) 5 MG tablet Take 1 tablet (5 mg total) by mouth as needed for migraine. 10 tablet 3   No current facility-administered medications for this visit.   No results found.  Review of Systems:   A ROS was performed including pertinent positives and negatives as documented in the HPI.  Physical Exam :   Constitutional: NAD and appears stated age Neurological: Alert and oriented Psych: Appropriate affect and cooperative Last menstrual period 07/09/2019.   Comprehensive Musculoskeletal Exam:      Musculoskeletal Exam  Gait Normal  Alignment Normal   Right Left  Inspection Normal Normal  Palpation    Tenderness Patellofemoral None  Crepitus None None  Effusion Moderate None  Range of Motion     Extension 0 passively although she actively is unable to fire her quadriceps muscle 0  Flexion 90 135  Strength    Extension 5/5 5/5  Flexion 5/5 5/5  Ligament Exam     Generalized Laxity No No  Lachman  Negative Negative   Pivot Shift Negative Negative  Anterior Drawer Negative Negative  Valgus at 0 Negative Negative  Valgus at 20 Negative Negative  Varus at 0 0 0  Varus at 20   0 0  Posterior Drawer at 90 0 0  Vascular/Lymphatic Exam    Edema None None  Venous Stasis Changes No No  Distal Circulation Normal Normal  Neurologic    Light Touch Sensation Intact Intact  Special Tests: Positive patella apprehension.  Unable to assess quadrant laxity due to apprehension and swelling   There is significant quadriceps decreased tone and atrophy.  Imaging:   Xray (4 views right knee): Normal  MRI right knee: She does appear to have a chronically stretched medial patellofemoral ligament.  There is evidence of intrasubstance tearing in this.  There is a large knee effusion.  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  TT-TG distance is 6. CDI measures 1.15  I personally reviewed and interpreted the radiographs.   Assessment:   46 y.o. female with right knee pain in the setting of multiple recurrent patellar instability.  I did describe that overall her right knee is quite complex.  Specifically she does have a large effusion which I believe has shut down her quadriceps muscle at this time.  To that effect I do believe that she would benefit from some physical therapy to help work on restoring the quadriceps mechanism prior to any type of surgical intervention.  With regard to this I did discuss continued operative versus nonoperative management.  Given the fact that her instances of knee instability has become so frequent even multiple times weekly she is quite concerned about this and at this time is hopeful to undergo  surgery.  I did describe that with this PVNS type lesion in the knee and any type of patellar instability is likely flaring this up or aggravating with which more recently has led to quite a large knee effusion which is unfortunately could not completely shut down her quadriceps mechanism.  This is quite disabling to her.  We did discuss possible nonoperative management although I believe it is unlikely that physical therapy would help her completely restore patella instability given the fact that she is now having nearly twice weekly occurrences.  Furthermore surgical debridement would be required for this specific PVNS lesion.  I did describe that she could benefit from right knee arthroscopy with PVNS lesion excision and MPFL reconstruction.  Given her bony metrics above, I do not necessarily believe that she would benefit from any type of tibial tubercle type procedure.  After discussion of risks and benefits of alternatives including continued physical therapy she has elected for surgical intervention.  I did describe that I would plan to send a biopsy of this PVNS type lesion for pathology as well. Plan :    -Plan for right knee arthroscopy with debridement and medial patellofemoral ligament reconstruction with allograft -That I would like to hold on this until she is better able to have her quadricep mechanism firing unrestored    After a lengthy discussion of treatment options, including risks, benefits, alternatives, complications of surgical and nonsurgical conservative options, the patient elected surgical repair.   The patient  is aware of the material risks  and complications including, but not limited to injury to adjacent structures, neurovascular injury, infection, numbness, bleeding, implant failure, thermal burns, stiffness, persistent pain, failure to heal, disease  transmission from allograft, need for further surgery, dislocation, anesthetic risks, blood clots, risks of death,and  others. The probabilities of surgical success and failure discussed with patient given their particular co-morbidities.The time and nature of expected rehabilitation and recovery was discussed.The patient's questions were all answered preoperatively.  No barriers to understanding were noted. I explained the natural history of the disease process and Rx rationale.  I explained to the patient what I considered to be reasonable expectations given their personal situation.  The final treatment plan was arrived at through a shared patient decision making process model.        I personally saw and evaluated the patient, and participated in the management and treatment plan.  Vanetta Mulders, MD Attending Physician, Orthopedic Surgery  This document was dictated using Dragon voice recognition software. A reasonable attempt at proof reading has been made to minimize errors.

## 2022-01-01 ENCOUNTER — Ambulatory Visit (HOSPITAL_BASED_OUTPATIENT_CLINIC_OR_DEPARTMENT_OTHER): Payer: Medicaid Other | Admitting: Physical Therapy

## 2022-01-03 ENCOUNTER — Ambulatory Visit (HOSPITAL_BASED_OUTPATIENT_CLINIC_OR_DEPARTMENT_OTHER): Payer: Medicaid Other | Admitting: Physical Therapy

## 2022-01-03 ENCOUNTER — Ambulatory Visit (HOSPITAL_BASED_OUTPATIENT_CLINIC_OR_DEPARTMENT_OTHER): Payer: Medicaid Other | Admitting: Orthopaedic Surgery

## 2022-01-04 ENCOUNTER — Ambulatory Visit (HOSPITAL_BASED_OUTPATIENT_CLINIC_OR_DEPARTMENT_OTHER): Payer: Medicaid Other | Attending: Orthopaedic Surgery | Admitting: Physical Therapy

## 2022-01-04 ENCOUNTER — Encounter (HOSPITAL_BASED_OUTPATIENT_CLINIC_OR_DEPARTMENT_OTHER): Payer: Self-pay | Admitting: Physical Therapy

## 2022-01-04 DIAGNOSIS — R262 Difficulty in walking, not elsewhere classified: Secondary | ICD-10-CM | POA: Diagnosis present

## 2022-01-04 DIAGNOSIS — M25661 Stiffness of right knee, not elsewhere classified: Secondary | ICD-10-CM | POA: Diagnosis present

## 2022-01-04 DIAGNOSIS — M25361 Other instability, right knee: Secondary | ICD-10-CM | POA: Insufficient documentation

## 2022-01-04 DIAGNOSIS — R600 Localized edema: Secondary | ICD-10-CM | POA: Insufficient documentation

## 2022-01-04 DIAGNOSIS — M25561 Pain in right knee: Secondary | ICD-10-CM | POA: Insufficient documentation

## 2022-01-04 DIAGNOSIS — M6281 Muscle weakness (generalized): Secondary | ICD-10-CM | POA: Insufficient documentation
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
## 2022-01-08 ENCOUNTER — Ambulatory Visit (HOSPITAL_BASED_OUTPATIENT_CLINIC_OR_DEPARTMENT_OTHER): Payer: Medicaid Other | Attending: Orthopaedic Surgery | Admitting: Physical Therapy

## 2022-01-08 ENCOUNTER — Encounter (HOSPITAL_BASED_OUTPATIENT_CLINIC_OR_DEPARTMENT_OTHER): Payer: Self-pay | Admitting: Physical Therapy

## 2022-01-08 DIAGNOSIS — M6281 Muscle weakness (generalized): Secondary | ICD-10-CM | POA: Insufficient documentation

## 2022-01-08 DIAGNOSIS — M25561 Pain in right knee: Secondary | ICD-10-CM | POA: Diagnosis not present

## 2022-01-08 DIAGNOSIS — M25361 Other instability, right knee: Secondary | ICD-10-CM | POA: Insufficient documentation

## 2022-01-08 DIAGNOSIS — M25661 Stiffness of right knee, not elsewhere classified: Secondary | ICD-10-CM | POA: Insufficient documentation

## 2022-01-08 DIAGNOSIS — R600 Localized edema: Secondary | ICD-10-CM | POA: Insufficient documentation

## 2022-01-08 DIAGNOSIS — R262 Difficulty in walking, not elsewhere classified: Secondary | ICD-10-CM | POA: Diagnosis present
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
## 2022-01-10 ENCOUNTER — Encounter (HOSPITAL_BASED_OUTPATIENT_CLINIC_OR_DEPARTMENT_OTHER): Payer: Self-pay | Admitting: Physical Therapy

## 2022-01-10 ENCOUNTER — Ambulatory Visit (HOSPITAL_BASED_OUTPATIENT_CLINIC_OR_DEPARTMENT_OTHER): Payer: Medicaid Other | Admitting: Physical Therapy

## 2022-01-10 DIAGNOSIS — M25561 Pain in right knee: Secondary | ICD-10-CM

## 2022-01-10 DIAGNOSIS — M6281 Muscle weakness (generalized): Secondary | ICD-10-CM

## 2022-01-10 DIAGNOSIS — M25661 Stiffness of right knee, not elsewhere classified: Secondary | ICD-10-CM
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
## 2022-01-12 ENCOUNTER — Telehealth: Payer: Self-pay | Admitting: Orthopaedic Surgery

## 2022-01-12 NOTE — Telephone Encounter (Signed)
Received $25.00 cash and medical records release form from patient/Forwarding to [facility] today

## 2022-01-15 ENCOUNTER — Other Ambulatory Visit: Payer: Self-pay

## 2022-01-15 ENCOUNTER — Encounter (HOSPITAL_BASED_OUTPATIENT_CLINIC_OR_DEPARTMENT_OTHER): Payer: Self-pay | Admitting: Orthopaedic Surgery

## 2022-01-15 ENCOUNTER — Ambulatory Visit (HOSPITAL_BASED_OUTPATIENT_CLINIC_OR_DEPARTMENT_OTHER): Payer: Medicaid Other | Admitting: Physical Therapy

## 2022-01-16 ENCOUNTER — Telehealth: Payer: Self-pay | Admitting: Orthopaedic Surgery

## 2022-01-16 NOTE — Telephone Encounter (Signed)
[place] forms received. To [place]. ?

## 2022-01-22 ENCOUNTER — Other Ambulatory Visit (HOSPITAL_BASED_OUTPATIENT_CLINIC_OR_DEPARTMENT_OTHER): Payer: Self-pay | Admitting: Orthopaedic Surgery

## 2022-01-22 ENCOUNTER — Ambulatory Visit (HOSPITAL_BASED_OUTPATIENT_CLINIC_OR_DEPARTMENT_OTHER): Payer: Medicaid Other | Admitting: Physical Therapy

## 2022-01-22 ENCOUNTER — Encounter (HOSPITAL_BASED_OUTPATIENT_CLINIC_OR_DEPARTMENT_OTHER): Payer: Self-pay | Admitting: Physical Therapy

## 2022-01-22 DIAGNOSIS — M25361 Other instability, right knee: Secondary | ICD-10-CM

## 2022-01-22 DIAGNOSIS — M25561 Pain in right knee: Secondary | ICD-10-CM | POA: Diagnosis not present

## 2022-01-22 DIAGNOSIS — R262 Difficulty in walking, not elsewhere classified: Secondary | ICD-10-CM

## 2022-01-22 DIAGNOSIS — M6281 Muscle weakness (generalized): Secondary | ICD-10-CM

## 2022-01-22 DIAGNOSIS — R600 Localized edema: Secondary | ICD-10-CM

## 2022-01-22 DIAGNOSIS — M25661 Stiffness of right knee, not elsewhere classified: Secondary | ICD-10-CM

## 2022-01-22 NOTE — Anesthesia Preprocedure Evaluation (Addendum)
Anesthesia Evaluation  Patient identified by MRN, date of birth, ID band Patient awake    Reviewed: Allergy & Precautions, NPO status , Patient's Chart, lab work & pertinent test results  Airway Mallampati: II  TM Distance: >3 FB Neck ROM: Full    Dental no notable dental hx. (+) Teeth Intact, Dental Advisory Given   Pulmonary neg pulmonary ROS, former smoker,    Pulmonary exam normal breath sounds clear to auscultation       Cardiovascular negative cardio ROS Normal cardiovascular exam Rhythm:Regular Rate:Normal     Neuro/Psych  Headaches, Seizures -, Well Controlled,  PSYCHIATRIC DISORDERS Anxiety Depression Bipolar Disorder    GI/Hepatic negative GI ROS, Neg liver ROS,   Endo/Other  negative endocrine ROS  Renal/GU negative Renal ROS  negative genitourinary   Musculoskeletal negative musculoskeletal ROS (+)   Abdominal   Peds  Hematology negative hematology ROS (+)   Anesthesia Other Findings   Reproductive/Obstetrics                            Anesthesia Physical Anesthesia Plan  ASA: 2  Anesthesia Plan: General   Post-op Pain Management: Tylenol PO (pre-op)*   Induction: Intravenous  PONV Risk Score and Plan: 3 and Ondansetron, Dexamethasone and Midazolam  Airway Management Planned: Oral ETT  Additional Equipment:   Intra-op Plan:   Post-operative Plan: Extubation in OR  Informed Consent: I have reviewed the patients History and Physical, chart, labs and discussed the procedure including the risks, benefits and alternatives for the proposed anesthesia with the patient or authorized representative who has indicated his/her understanding and acceptance.     Dental advisory given  Plan Discussed with: CRNA  Anesthesia Plan Comments:        Anesthesia Quick Evaluation

## 2022-01-22 NOTE — Progress Notes (Signed)
Sent text reminding pt to come in and pick up pre surgery drink.

## 2022-01-22 NOTE — Therapy (Signed)
OUTPATIENT PHYSICAL THERAPY LOWER EXTREMITY EVALUATION   Patient Name: Sarah Barber MRN: [id number] DOB:April 18, 1976, 46 y.o., female Today's Date: 01/22/2022   PT End of Session - 01/22/22 0817     Visit Number 4    Number of Visits 6    Date for PT Re-Evaluation 01/25/22    Authorization Type [employer] healthy blue    PT Start Time 0816    PT Stop Time 0855    PT Time Calculation (min) 39 min    Activity Tolerance Patient tolerated treatment well;Patient limited by pain    Behavior During Therapy [facility] for tasks assessed/performed              Past Medical History:  Diagnosis Date   Allergies    Anxiety    Bipolar disorder (HCC)    Chronic headaches    Depression    Fatigue    Menstrual pain    Panic attacks    PTSD (post-traumatic stress disorder)    Seizure ([place])    as a child - last one at age 61- petite mal   Vision changes    Past Surgical History:  Procedure Laterality Date   CESAREAN SECTION      x 2   HYSTERECTOMY ABDOMINAL WITH SALPINGECTOMY Bilateral 07/28/2019   Procedure: HYSTERECTOMY ABDOMINAL WITH BILATERAL SALPINGECTOMY AND RIGHT OOPHORECTOMY;  Surgeon: [name], MD;  Location: [facility];  Service: Gynecology;  Laterality: Bilateral;   Patient Active Problem List   Diagnosis Date Noted   Status post hysterectomy 07/28/2019   Anemia due to acute blood loss 07/23/2019   Fibroid uterus 04/27/2019   Family history of breast cancer in first degree relative 04/27/2019   Abnormal uterine bleeding 04/27/2019     REFERRING PROVIDER: Vanetta Mulders, MD   REFERRING DIAG: M25.361 (ICD-10-CM) - Patellar instability of right knee   THERAPY DIAG:  Right knee pain, unspecified chronicity  Stiffness of right knee, not elsewhere classified  Muscle weakness (generalized)  Difficulty in walking, not elsewhere classified  Localized edema  Rationale for Evaluation and Treatment Rehabilitation  ONSET DATE: Chronic / Exacerbation on  12/18/2021  SUBJECTIVE:   SUBJECTIVE STATEMENT:  Pt reports she has been working on ROM and trying to use her UE less to move leg.  She is ready for surgery "so that I can let the healing begin".   She was unable to attend last PT appt due to transportation issues.  Receptionist wheeled pt to/from pool area today.  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 Yes NRPS:  5/10 Location:  R knee Description: achy Alleviating: hot/cold Aggravating: ?  PRECAUTIONS: Other: knee instability, MRI findings, planned surgery  WEIGHT BEARING RESTRICTIONS No  FALLS:  Has patient fallen in last 6 months? 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   PLOF: Independent; Pt was able to ambulate without an AD.  She performed her daily activities and functional mobility skills independently.  Pt able to perform work activities.    PATIENT GOALS to be able to move the leg   OBJECTIVE:   DIAGNOSTIC FINDINGS:  Per MD note: Xray: Normal   MRI right knee: She does appear to have  a chronically stretched medial patellofemoral ligament.  There is evidence of intrasubstance tearing in this.  There is a large knee effusion.  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 No evidence of recent transient patellar dislocation injury or other acute osseous findings. 2. The menisci, cruciate and collateral ligaments are intact. 3. Large complex knee joint effusion with irregular mass extending anteriorly from the intercondylar notch. 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 No associated osseous erosion.  LOWER EXTREMITY ROM:    ROM Right eval Left eval Right  01/22/22  Hip flexion       Hip extension       Hip abduction       Hip adduction       Hip internal rotation       Hip external rotation       Knee flexion 80 deg passive 136 111  Seated scoot  Knee extension Lacking 34 deg in resting position; lacking 26 deg passively 7 deg of hyperextension Lacking 37 in long sitting  Ankle dorsiflexion       Ankle plantarflexion       Ankle inversion       Ankle eversion        (Blank rows = not tested)   PATIENT SURVEYS:  LEFS 19/80  GAIT: Assistive device utilized: Crutches Comments: Pt ambulates with a hop to gait pattern with bilat crutches.  Pt did not apply weight thru R LE.     TODAY'S TREATMENT: Sitting on hot tub surround on yoga mat- ROM measurements taken  Pt seen for aquatic therapy today.  Treatment took place in water 3.25-4.5 ft in depth at the [place]. Temp of water was 91.  Pt entered/exited the pool via chair lift.  SBA for transfer to/from [facility] (with stand pivot transfer)  Therapist in water with patient due to limited mobility and decrease confidence in water  In water >4 ft at wall:  Weight shifts R/L  In water [place]: in seated position - forward/backward "stool scoots"; standing upright- forward / backward gait with cues for heel strike/toe off, step length, and even weight shift; side step R/L Holding wall:  Heel raises; squats, depth to comfort;  Rt forward kicks (with foot flexed), Rt hip abdct; attempts at [facility] with tactile cues behind knee Seated at bench in water: LAQ with DF alternating LEs  Pt requires the buoyancy of water for active assisted exercises with buoyancy supported for strengthening and AROM exercises.  Hydrostatic pressure also supports joints by unweighting joint load by at least 50 % in 3-4 feet depth water. 80% in chest to neck deep water. Water will provide assistance with movement using the current and laminar flow while the buoyancy reduces weight bearing. Pt requires the viscosity of the water for resistance with strengthening exercises.    PATIENT EDUCATION:  Education details: anatomy, exercise progression, DOMS expectations, muscle firing,  envelope of function, HEP, POC Person educated: Patient Education method: Customer service manager Education comprehension: verbalized understanding and needs further education   HOME EXERCISE PROGRAM: Access Code: [email or web address] URL: [email or web address] Date: 01/08/2022 Prepared by: Daleen Bo   ASSESSMENT:  CLINICAL IMPRESSION:  Pt continues to move in guarded manner in pool; requires therapist to be  in water with her. She did well with gait using water [place] in 4.25 ft of water with improved weightbearing through RLE and improved gait mechanics with cues.  Continued limitation of Rt knee extension ROM (lacking 37); improved Rt knee flexion ROM.  She reported fear of quad contracting with attempts to straighten R knee; unable to perform quad set.   She was able to complete Partial LAQ on bench in water at end of session (although still lacks full extension). Pt has partially met her goals.  Pt  would benefit from skilled PT services to improve ROM, strength, and mobility prior to surgery.     OBJECTIVE IMPAIRMENTS Abnormal gait, decreased activity tolerance, decreased endurance, decreased mobility, difficulty walking, decreased ROM, decreased strength, hypomobility, increased edema, and pain.   ACTIVITY LIMITATIONS carrying, bending, standing, squatting, stairs, transfers, bed mobility, locomotion level, and caring for others  PARTICIPATION LIMITATIONS: meal prep, cleaning, laundry, driving, shopping, community activity,  and occupation  PERSONAL FACTORS 1-2 comorbidities: Bipolar and depression  are also affecting patient's functional outcome.   REHAB POTENTIAL: Good  CLINICAL DECISION MAKING: Stable/uncomplicated  EVALUATION COMPLEXITY: Low   GOALS:  SHORT TERM GOALS: Target date: 01/18/2022  Pt will demo at least 10 deg improvement in knee extension AROM and PROM for improved stiffness and mobility.  Baseline: Goal status: Not met  2.  Pt will be able to activate quad and perform a quad set for improved strength and mobility.  Baseline:  Goal status:Not met     LONG TERM GOALS: Target date: 01/25/2022  Pt will demo a fair quad set for improved quad activation to promote increased quad activation post surgery. .  Baseline:  Goal status: Not met   2.  Pt will be able to apply increased [place] thru R LE in standing for improved gait.  Baseline:  Goal status:Partially met   3.  Pt will demo R knee extension AROM/PROM to be at least 10-12/5-8 deg and R knee flexion PROM/AAROM to be at least 100/90 deg for improved stiffness and to promote increased ROM and reduced stiffness post surgery.  Baseline:  Goal status: Partially met   4.  Pt will be able to lift R LE onto table independently for improved strength and bed mobility.  Baseline:  Goal status: Ongoing   PLAN: PT FREQUENCY: 2x/week  PT DURATION: 3 weeks  PLANNED INTERVENTIONS: Therapeutic exercises, Therapeutic activity, Neuromuscular re-education, Gait training, Patient/Family education, Joint mobilization, Stair training, Aquatic Therapy, Electrical stimulation, Cryotherapy, Taping, Manual therapy, and Re-evaluation  PLAN FOR NEXT SESSION:   Reassess after surgery.   Quad activation/strengthening.  [place] e-stim to quad.  Knee ROM especially extension ROM.  Work on regaining LE movement.   R knee patellofemoral ligament reconstruction surgery is planned for 01/23/2022  Check all possible CPT codes: 76160 - PT Re-evaluation,  97110- Therapeutic Exercise, [phone]- Neuro Re-education, [phone] - Gait Training, [phone] - Manual Therapy, 70350 - Therapeutic Activities, [phone] - Self Care, [phone] - Electrical stimulation (unattended), [id number] - Ultrasound, [id number] - Physical performance training, and [phone] - Aquatic therapy     Kerin Perna, PTA 01/22/22 10:14 AM

## 2022-01-23 ENCOUNTER — Encounter (HOSPITAL_BASED_OUTPATIENT_CLINIC_OR_DEPARTMENT_OTHER): Payer: Self-pay | Admitting: Orthopaedic Surgery

## 2022-01-23 ENCOUNTER — Other Ambulatory Visit: Payer: Self-pay

## 2022-01-23 ENCOUNTER — Ambulatory Visit (HOSPITAL_BASED_OUTPATIENT_CLINIC_OR_DEPARTMENT_OTHER): Payer: Medicaid Other

## 2022-01-23 ENCOUNTER — Ambulatory Visit (HOSPITAL_BASED_OUTPATIENT_CLINIC_OR_DEPARTMENT_OTHER): Payer: Medicaid Other | Admitting: Anesthesiology

## 2022-01-23 ENCOUNTER — Ambulatory Visit (HOSPITAL_BASED_OUTPATIENT_CLINIC_OR_DEPARTMENT_OTHER)
Admission: RE | Admit: 2022-01-23 | Discharge: 2022-01-23 | Disposition: A | Discharge status: Home / Self Care | Payer: Medicaid Other | Attending: Orthopaedic Surgery | Admitting: Orthopaedic Surgery

## 2022-01-23 ENCOUNTER — Encounter (HOSPITAL_BASED_OUTPATIENT_CLINIC_OR_DEPARTMENT_OTHER)
Admission: RE | Disposition: A | Discharge status: Home / Self Care | Payer: Self-pay | Source: Home / Self Care | Attending: Orthopaedic Surgery

## 2022-01-23 DIAGNOSIS — R519 Headache, unspecified: Secondary | ICD-10-CM | POA: Insufficient documentation

## 2022-01-23 DIAGNOSIS — M25061 Hemarthrosis, right knee: Secondary | ICD-10-CM | POA: Diagnosis not present

## 2022-01-23 DIAGNOSIS — M12261 Villonodular synovitis (pigmented), right knee: Secondary | ICD-10-CM

## 2022-01-23 DIAGNOSIS — Z87891 Personal history of nicotine dependence: Secondary | ICD-10-CM | POA: Diagnosis not present

## 2022-01-23 DIAGNOSIS — M1220 Villonodular synovitis (pigmented), unspecified site: Secondary | ICD-10-CM

## 2022-01-23 DIAGNOSIS — M6588 Other synovitis and tenosynovitis, other site: Secondary | ICD-10-CM | POA: Insufficient documentation

## 2022-01-23 DIAGNOSIS — M2351 Chronic instability of knee, right knee: Secondary | ICD-10-CM | POA: Diagnosis not present

## 2022-01-23 DIAGNOSIS — M25361 Other instability, right knee: Secondary | ICD-10-CM | POA: Diagnosis present

## 2022-01-23 HISTORY — PX: KNEE RECONSTRUCTION: SHX5883

## 2022-01-23 HISTORY — PX: KNEE ARTHROSCOPY: SHX127

## 2022-01-23 SURGERY — RECONSTRUCTION, KNEE
Anesthesia: General | Site: Knee | Laterality: Right

## 2022-01-23 MED ORDER — FENTANYL CITRATE (PF) 100 MCG/2ML IJ SOLN
INTRAMUSCULAR | Status: AC
  Filled 2022-01-23: qty 2

## 2022-01-23 MED ORDER — ACETAMINOPHEN 500 MG PO TABS
1000.0000 mg | ORAL_TABLET | Freq: Once | ORAL | Status: AC
  Administered 2022-01-23: 1000 mg via ORAL

## 2022-01-23 MED ORDER — TRANEXAMIC ACID-NACL 1000-0.7 MG/100ML-% IV SOLN
INTRAVENOUS | Status: AC
  Filled 2022-01-23: qty 100

## 2022-01-23 MED ORDER — FENTANYL CITRATE (PF) 100 MCG/2ML IJ SOLN
100.0000 ug | Freq: Once | INTRAMUSCULAR | Status: AC
  Administered 2022-01-23: 100 ug via INTRAVENOUS

## 2022-01-23 MED ORDER — ONDANSETRON HCL 4 MG/2ML IJ SOLN
INTRAMUSCULAR | Status: DC | PRN
  Administered 2022-01-23: 4 mg via INTRAVENOUS

## 2022-01-23 MED ORDER — PROPOFOL 10 MG/ML IV BOLUS
INTRAVENOUS | Status: DC | PRN
  Administered 2022-01-23: 200 mg via INTRAVENOUS

## 2022-01-23 MED ORDER — ACETAMINOPHEN 500 MG PO TABS
ORAL_TABLET | ORAL | Status: AC
  Filled 2022-01-23: qty 2

## 2022-01-23 MED ORDER — CEFAZOLIN SODIUM-DEXTROSE 2-4 GM/100ML-% IV SOLN
INTRAVENOUS | Status: AC
  Filled 2022-01-23: qty 100

## 2022-01-23 MED ORDER — TRANEXAMIC ACID-NACL 1000-0.7 MG/100ML-% IV SOLN
1000.0000 mg | INTRAVENOUS | Status: AC
  Administered 2022-01-23: 1000 mg via INTRAVENOUS

## 2022-01-23 MED ORDER — GABAPENTIN 300 MG PO CAPS
ORAL_CAPSULE | ORAL | Status: AC
  Filled 2022-01-23: qty 1

## 2022-01-23 MED ORDER — LACTATED RINGERS IV SOLN: INTRAVENOUS | Status: DC

## 2022-01-23 MED ORDER — VANCOMYCIN HCL 1000 MG IV SOLR
INTRAVENOUS | Status: DC | PRN
  Administered 2022-01-23: 1000 mg via TOPICAL

## 2022-01-23 MED ORDER — FENTANYL CITRATE (PF) 100 MCG/2ML IJ SOLN
25.0000 ug | INTRAMUSCULAR | Status: DC | PRN
  Administered 2022-01-23: 50 ug via INTRAVENOUS

## 2022-01-23 MED ORDER — ROPIVACAINE HCL 5 MG/ML IJ SOLN
INTRAMUSCULAR | Status: DC | PRN
  Administered 2022-01-23: 25 mL via PERINEURAL

## 2022-01-23 MED ORDER — CEFAZOLIN SODIUM-DEXTROSE 2-4 GM/100ML-% IV SOLN
2.0000 g | INTRAVENOUS | Status: AC
  Administered 2022-01-23: 2 g via INTRAVENOUS

## 2022-01-23 MED ORDER — MIDAZOLAM HCL 2 MG/2ML IJ SOLN
INTRAMUSCULAR | Status: AC
  Filled 2022-01-23: qty 2

## 2022-01-23 MED ORDER — 0.9 % SODIUM CHLORIDE (POUR BTL) OPTIME
TOPICAL | Status: DC | PRN
  Administered 2022-01-23: 1000 mL

## 2022-01-23 MED ORDER — GABAPENTIN 300 MG PO CAPS
300.0000 mg | ORAL_CAPSULE | Freq: Once | ORAL | Status: AC
  Administered 2022-01-23: 300 mg via ORAL

## 2022-01-23 MED ORDER — VANCOMYCIN HCL 1000 MG IV SOLR
INTRAVENOUS | Status: AC
  Filled 2022-01-23: qty 20

## 2022-01-23 MED ORDER — SODIUM CHLORIDE 0.9 % IR SOLN
Status: DC | PRN
  Administered 2022-01-23: 4500 mL

## 2022-01-23 MED ORDER — MIDAZOLAM HCL 2 MG/2ML IJ SOLN
2.0000 mg | Freq: Once | INTRAMUSCULAR | Status: AC
  Administered 2022-01-23: 2 mg via INTRAVENOUS

## 2022-01-23 MED ORDER — DEXAMETHASONE SODIUM PHOSPHATE 10 MG/ML IJ SOLN
INTRAMUSCULAR | Status: DC | PRN
  Administered 2022-01-23: 5 mg

## 2022-01-23 MED ORDER — EPINEPHRINE PF 1 MG/ML IJ SOLN
INTRAMUSCULAR | Status: AC
  Filled 2022-01-23: qty 4

## 2022-01-23 MED ORDER — FENTANYL CITRATE (PF) 100 MCG/2ML IJ SOLN
INTRAMUSCULAR | Status: DC | PRN
  Administered 2022-01-23 (×4): 25 ug via INTRAVENOUS

## 2022-01-23 MED ORDER — PROPOFOL 500 MG/50ML IV EMUL
INTRAVENOUS | Status: DC | PRN
  Administered 2022-01-23: 150 ug/kg/min via INTRAVENOUS

## 2022-01-23 MED ORDER — BUPIVACAINE-EPINEPHRINE (PF) 0.25% -1:200000 IJ SOLN
INTRAMUSCULAR | Status: AC
  Filled 2022-01-23: qty 30

## 2022-01-23 MED ORDER — BUPIVACAINE HCL (PF) 0.25 % IJ SOLN
INTRAMUSCULAR | Status: AC
  Filled 2022-01-23: qty 30

## 2022-01-23 SURGICAL SUPPLY — 96 items
ANCH SUT FBRTK ROTR CUF STRL (Anchor) ×2 IMPLANT
ANCH SUT SWLK 19.1X4.75 VT (Anchor) ×1 IMPLANT
ANCHOR PEEK 4.75X19.1 SWLK C (Anchor) ×1 IMPLANT
ANCHOR SUT FBRTK 2.6 KNTLS (Anchor) ×2 IMPLANT
APL PRP STRL LF DISP 70% ISPRP (MISCELLANEOUS) ×1
APL SKNCLS STERI-STRIP NONHPOA (GAUZE/BANDAGES/DRESSINGS)
BANDAGE ESMARK 6X9 LF (GAUZE/BANDAGES/DRESSINGS) IMPLANT
BENZOIN TINCTURE PRP APPL 2/3 (GAUZE/BANDAGES/DRESSINGS) IMPLANT
BLADE EXCALIBUR 4.0X13 (MISCELLANEOUS) ×1 IMPLANT
BLADE SURG 15 STRL LF DISP TIS (BLADE) ×1 IMPLANT
BLADE SURG 15 STRL SS (BLADE) ×4
BNDG CMPR 9X6 STRL LF SNTH (GAUZE/BANDAGES/DRESSINGS) ×1
BNDG ELASTIC 4X5.8 VLCR STR LF (GAUZE/BANDAGES/DRESSINGS) ×1 IMPLANT
BNDG ELASTIC 6X5.8 VLCR STR LF (GAUZE/BANDAGES/DRESSINGS) ×2 IMPLANT
BNDG ESMARK 6X9 LF (GAUZE/BANDAGES/DRESSINGS) ×2
CHLORAPREP W/TINT 26 (MISCELLANEOUS) ×2 IMPLANT
COOLER ICEMAN CLASSIC (MISCELLANEOUS) ×2 IMPLANT
CUFF TOURN SGL QUICK 34 (TOURNIQUET CUFF) ×2
CUFF TRNQT CYL 34X4.125X (TOURNIQUET CUFF) ×1 IMPLANT
DISSECTOR  3.8MM X 13CM (MISCELLANEOUS)
DISSECTOR 3.8MM X 13CM (MISCELLANEOUS) ×1 IMPLANT
DRAPE ARTHROSCOPY W/POUCH 90 (DRAPES) ×2 IMPLANT
DRAPE C-ARM 42X72 X-RAY (DRAPES) ×1 IMPLANT
DRAPE C-ARMOR (DRAPES) ×1 IMPLANT
DRAPE IMP U-DRAPE 54X76 (DRAPES) ×1 IMPLANT
DRAPE INCISE IOBAN 66X45 STRL (DRAPES) IMPLANT
DRAPE OEC MINIVIEW 54X84 (DRAPES) ×1 IMPLANT
DRAPE U-SHAPE 47X51 STRL (DRAPES) ×2 IMPLANT
DRSG PAD ABDOMINAL 8X10 ST (GAUZE/BANDAGES/DRESSINGS) ×2 IMPLANT
DRSG TEGADERM 4X4.75 (GAUZE/BANDAGES/DRESSINGS) ×6 IMPLANT
DW OUTFLOW CASSETTE/TUBE SET (MISCELLANEOUS) ×1 IMPLANT
ELECT REM PT RETURN 9FT ADLT (ELECTROSURGICAL) ×2
ELECTRODE REM PT RTRN 9FT ADLT (ELECTROSURGICAL) ×1 IMPLANT
EXCALIBUR 3.8MM X 13CM (MISCELLANEOUS) IMPLANT
GAUZE 4X4 16PLY ~~LOC~~+RFID DBL (SPONGE) IMPLANT
GAUZE SPONGE 4X4 12PLY STRL (GAUZE/BANDAGES/DRESSINGS) ×2 IMPLANT
GAUZE XEROFORM 1X8 LF (GAUZE/BANDAGES/DRESSINGS) ×2 IMPLANT
GLOVE BIO SURGEON STRL SZ 6 (GLOVE) ×2 IMPLANT
GLOVE BIO SURGEON STRL SZ7.5 (GLOVE) ×4 IMPLANT
GLOVE BIOGEL PI IND STRL 6.5 (GLOVE) ×1 IMPLANT
GLOVE BIOGEL PI IND STRL 7.0 (GLOVE) IMPLANT
GLOVE BIOGEL PI IND STRL 7.5 (GLOVE) IMPLANT
GLOVE BIOGEL PI IND STRL 8 (GLOVE) ×1 IMPLANT
GLOVE BIOGEL PI INDICATOR 6.5 (GLOVE) ×1
GLOVE BIOGEL PI INDICATOR 7.0 (GLOVE) ×2
GLOVE BIOGEL PI INDICATOR 7.5 (GLOVE) ×1
GLOVE BIOGEL PI INDICATOR 8 (GLOVE) ×1
GLOVE SURG SS PI 7.0 STRL IVOR (GLOVE) ×2 IMPLANT
GLOVE SURG SS PI 7.5 STRL IVOR (GLOVE) ×1 IMPLANT
GOWN STRL REUS W/ TWL LRG LVL3 (GOWN DISPOSABLE) ×1 IMPLANT
GOWN STRL REUS W/ TWL XL LVL3 (GOWN DISPOSABLE) ×1 IMPLANT
GOWN STRL REUS W/TWL LRG LVL3 (GOWN DISPOSABLE) ×2
GOWN STRL REUS W/TWL XL LVL3 (GOWN DISPOSABLE) ×4
GRAFT TISS 230-320 GRACILIS (Bone Implant) IMPLANT
KIT TRANSTIBIAL (DISPOSABLE) ×2 IMPLANT
MANIFOLD NEPTUNE II (INSTRUMENTS) ×2 IMPLANT
NDL HYPO 18GX1.5 BLUNT FILL (NEEDLE) IMPLANT
NDL SAFETY ECLIPSE 18X1.5 (NEEDLE) IMPLANT
NDL SUT 6 .5 CRC .975X.05 MAYO (NEEDLE) IMPLANT
NEEDLE HYPO 18GX1.5 BLUNT FILL (NEEDLE) ×2 IMPLANT
NEEDLE HYPO 18GX1.5 SHARP (NEEDLE) ×2
NEEDLE MAYO TAPER (NEEDLE)
PACK ARTHROSCOPY DSU (CUSTOM PROCEDURE TRAY) ×2 IMPLANT
PACK BASIN DAY SURGERY FS (CUSTOM PROCEDURE TRAY) ×2 IMPLANT
PAD COLD SHLDR WRAP-ON (PAD) ×2 IMPLANT
PADDING CAST COTTON 6X4 STRL (CAST SUPPLIES) IMPLANT
PENCIL SMOKE EVACUATOR (MISCELLANEOUS) ×2 IMPLANT
PORT APPOLLO RF 90DEGREE MULTI (SURGICAL WAND) ×1 IMPLANT
SET IRRIG Y TYPE TUR BLADDER L (SET/KITS/TRAYS/PACK) ×2 IMPLANT
SLEEVE SCD COMPRESS KNEE MED (STOCKING) ×2 IMPLANT
SPONGE T-LAP 4X18 ~~LOC~~+RFID (SPONGE) ×1 IMPLANT
STRIP CLOSURE SKIN 1/2X4 (GAUZE/BANDAGES/DRESSINGS) ×1 IMPLANT
SUCTION FRAZIER HANDLE 10FR (MISCELLANEOUS) ×1
SUCTION TUBE FRAZIER 10FR DISP (MISCELLANEOUS) IMPLANT
SUT 0 FIBERLOOP 38 BLUE TPR ND (SUTURE)
SUT ETHILON 3 0 PS 1 (SUTURE) ×3 IMPLANT
SUT FIBERWIRE #2 38 T-5 BLUE (SUTURE) ×2
SUT MNCRL AB 3-0 PS2 27 (SUTURE) ×1 IMPLANT
SUT VIC AB 0 CT1 27 (SUTURE) ×2
SUT VIC AB 0 CT1 27XBRD ANBCTR (SUTURE) IMPLANT
SUT VIC AB 2-0 SH 27 (SUTURE) ×2
SUT VIC AB 2-0 SH 27XBRD (SUTURE) ×1 IMPLANT
SUT VIC AB 3-0 FS2 27 (SUTURE) IMPLANT
SUT VIC AB 3-0 SH 27 (SUTURE)
SUT VIC AB 3-0 SH 27X BRD (SUTURE) IMPLANT
SUTURE 0 FIBERLP 38 BLU TPR ND (SUTURE) IMPLANT
SUTURE FIBERWR #2 38 T-5 BLUE (SUTURE) IMPLANT
SUTURE TAPE 1.3 FIBERLOP 20 ST (SUTURE) ×2 IMPLANT
SUTURETAPE 1.3 FIBERLOOP 20 ST (SUTURE) ×2
SYR 5ML LL (SYRINGE) ×2 IMPLANT
SYSTEM IMPLANT FDL 4.75 (Anchor) ×1 IMPLANT
TENDON GRACILIS FROZEN (Bone Implant) ×2 IMPLANT
TENDON GRACILIS FROZEN 230-320 (Bone Implant) ×1 IMPLANT
TOWEL GREEN STERILE FF (TOWEL DISPOSABLE) ×4 IMPLANT
TUBING ARTHROSCOPY IRRIG 16FT (MISCELLANEOUS) ×2 IMPLANT
YANKAUER SUCT BULB TIP NO VENT (SUCTIONS) ×2 IMPLANT

## 2022-01-23 NOTE — Op Note (Signed)
Date of Surgery: 01/23/2022  INDICATIONS: Ms. Hodgkiss is a 46 y.o.-year-old female with right knee recurrent patellar instability.  She has also had significant hemarthrosis in the setting of a synovial lesion which appears to be PVNS.  Given the multitude of recurrences she had ultimately elected for operative repair.  The risk and benefits of the procedure were discussed in detail and documented in the pre-operative evaluation.   PREOPERATIVE DIAGNOSIS: 1.  Right knee recurrent patellar instability 2.  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  PROCEDURE: 1.  Right knee limited synovectomy 2.  Right knee medial patellofemoral ligament reconstruction with gracilis allograft  SURGEON: Yevonne Pax MD  ASSISTANT: Raynelle Fanning, ATC  ANESTHESIA:  general plus peripheral nerve block  IV FLUIDS AND URINE: See anesthesia record.  ANTIBIOTICS: Ancef  ESTIMATED BLOOD LOSS: 25 mL.  IMPLANTS:  Implant Name Type Inv. Item Serial No. Manufacturer Lot No. LRB No. Used Action  TENDON GRACILIS FROZEN - [id number] Bone Implant TENDON GRACILIS FROZEN [id number] [facility] [id number] Right 1 Implanted  SYSTEM IMPLANT FDL 4.75 - [id number] Anchor SYSTEM IMPLANT FDL 4.75  [employer] [id number] Right 1 Implanted  ANCHOR PEEK 4.75X19.1 SWLK C - [id number] Anchor ANCHOR PEEK 4.75X19.1 SWLK C  [employer] [id number] Right 1 Implanted  ANCHOR SUT FBRTK 2.6 KNTLS - [id number] Anchor ANCHOR SUT FBRTK 2.6 KNTLS  [employer] [id number] Right 2 Implanted    DRAINS: None  CULTURES: None  COMPLICATIONS: none  DESCRIPTION OF PROCEDURE:  Examination under anesthesia: A careful examination under anesthesia was performed.  Knee ROM motion was: 0-125 Lachman: Normal Pivot Shift: Normal Posterior drawer: normal.   Varus stability in full extension: normal.   Varus stability in 30 degrees of flexion: normal.  Valgus stability in full extension: normal.   Valgus stability in 30  degrees of flexion: normal.  Posterolateral drawer: normal There were 4 quadrants of lateral patellar motion and to medially.  There is near dislocation with knee extension and lateral motion of the patella   Intra-operative findings: A thorough arthroscopic examination of the knee was performed.  The findings are: 1. Suprapatellar pouch: Significant hemarthrosis and scarring of the pouch 2. Undersurface of median ridge: Normal 3. Medial patellar facet: Normal 4. Lateral patellar facet: Normal 5. Trochlea: There is a large appearing red lesion that had replaced the ligamentum mucosum 6. Lateral gutter/popliteus tendon: Normal 7. Hoffa's fat pad: Significant hemarthrosis and inflammation 8. Medial gutter/plica: Normal 9. ACL: Normal 10. PCL: Normal 11. Medial meniscus: Normal 12. Medial compartment cartilage: Normal 13. Lateral meniscus: Normal 14. Lateral compartment cartilage: Normal   I identified the patient in the pre-operative holding area.  I marked the operative right knee with my initials. I reviewed the risks and benefits of the proposed surgical intervention and the patient (and/or patient's guardian) wished to proceed.  Anesthesia was then performed with regional block.  The patient was transferred to the operative suite and placed in the supine position with all bony prominences padded.     SCDs were placed on the non-operative lower extremity. Appropriate antibiotics was administered within 1 hour before incision. The operative extremity was then prepped and draped in standard fashion. A time out was performed confirming the correct extremity, correct patient and correct procedure. A tourniquet was placed but not inflated   A standard anterolateral portal was made with an 11 blade.  The ideal position for the anteromedial portal was established using a spinal needle.  A full diagnostic arthroscopy  was then performed, as described above.  At this time a 4 arthroscopic shaver  was introduced into the knee and a debridement was performed on the lesion in the notch.  This did appear to be a PVNS.  A portion of this was sampled and sent for pathology.  Electrocautery was used to achieve hemostasis following this  At this point, I directed my attention to medial patellofemoral ligament reconstruction.  On the back table, a gracilis allograft was thawed.  This was ultimately folded in half and the central portion of it was then whipstitched using a #2 fiber loop.  A 3 cm incision over the medial aspect of the patella was created and dissection was carried down to layer 1. Full thickness medial and lateral cutaneous flaps were developed.     At this point, a 15 blade was used to dissect layers 1 and 2 off the medial patella, and I then bluntly developed the interval between layers 2 and 3.  This was taken bluntly all the way to the level of the medial epicondyle.   Next, electrocautery was used to remove soft tissue from the medial aspect of the patella from the proximal pole of the equator.  The medial edge of the patella was debrided with a rongeur to create a bleeding trough of bone to accommodate the graft.     At this point, the medial epicondylar incision was localized with fluoroscopy and created with a 15 blade.  Sharp dissection was carried down to the fascia.  The fascia was then sharply incised and a Kelly clamp was placed between layers 2 and 3, from the level of the patella, and brought out the medial epicondylar incision, guaranteeing a good soft tissue tunnel for passage of the graft.  A passing suture was then passed through these layers.   At this point, a 4.75 mm swivel lock guide was placed at [facility]' point on a perfect lateral radiograph and this position was confirmed by direct palpation in between the medial epicondyle and the adductor tubercle.  The anchor pilot hole was then drilled. The portion of the aperture was then notched in order to allow for  influx of the graft into the tunnel site.  The whipstitch sutures were then brought into the anchor and this was placed.   At this point, the non looped ends of the allograft was placed in the medial patella incision using a passing suture from the previous dissection, and fixed to the medial patella with the two 2.6 mm knotless fibertak anchors.  This was done with the knee and 30 degrees of flexion with pressure on the lateral patella in order to achieve excellent reduction. Excess suture length was removed. With the knee in the same position of flexion, a medial reefing was performed to close layers 1 and 2 in a pants-over-vest fashion.   Repeat EUA showed retention of full symmetric hyperextension, and normal flexion without tension on the graft to 135 degrees.  Patellar tracking was central with 2 lateral quadrants of motion equal to the medial endpoint and with a firm endpoint to lateral translation.   The wounds were copiously irrigated. All the incisions were then closed in layers, 0-vicryl for deep layers, 2-0 vicryl for deep dermis, and a 3-0 nylon. The arthroscopy portals were closed with 3- 0 nylon. A sterile dressing was applied followed by a Iceman device and a hinged knee brace locked in extension.   The patient awoke from anesthesia without difficulty and was transferred  to PACU in stable condition.       POSTOPERATIVE PLAN: She will be weightbearing as tolerated with the right leg in extension.  She will progress according to the patellofemoral protocol.  I will see her back in 2 weeks for wound check and suture removal.  She was placed on aspirin for postop DVT prophylaxis  Yevonne Pax, MD 11:30 AM

## 2022-01-23 NOTE — Transfer of Care (Signed)
Immediate Anesthesia Transfer of Care Note  Patient: Sarah Barber  Procedure(s) Performed: RIGHT KNEE PATELLOFEMORAL LIGAMENT RECONSTRUCTION (Right: Knee) RIGHT KNEE ARTHROSCOPY WITH DEBRIDEMENT (Right: Knee)  Patient Location: PACU  Anesthesia Type:GA combined with regional for post-op pain  Level of Consciousness: drowsy and patient cooperative  Airway & Oxygen Therapy: Patient Spontanous Breathing and Patient connected to face mask oxygen  Post-op Assessment: Report given to RN and Post -op Vital signs reviewed and stable  Post vital signs: Reviewed and stable  Last Vitals:  Vitals Value Taken Time  BP    Temp    Pulse    Resp    SpO2      Last Pain:  Vitals:   01/23/22 0719  TempSrc: Oral  PainSc: 5          Complications: No notable events documented.

## 2022-01-23 NOTE — Anesthesia Postprocedure Evaluation (Signed)
Anesthesia Post Note  Patient: Conservation officer, nature  Procedure(s) Performed: RIGHT KNEE PATELLOFEMORAL LIGAMENT RECONSTRUCTION (Right: Knee) RIGHT KNEE ARTHROSCOPY WITH DEBRIDEMENT (Right: Knee)     Patient location during evaluation: PACU Anesthesia Type: General and Regional Level of consciousness: awake and alert Pain management: pain level controlled Vital Signs Assessment: post-procedure vital signs reviewed and stable Respiratory status: spontaneous breathing, nonlabored ventilation, respiratory function stable and patient connected to nasal cannula oxygen Cardiovascular status: blood pressure returned to baseline and stable Postop Assessment: no apparent nausea or vomiting Anesthetic complications: no   No notable events documented.  Last Vitals:  Vitals:   01/23/22 1245 01/23/22 1300  BP: 111/73 116/79  Pulse: 80 80  Resp: 14 12  Temp:    SpO2: 99% 96%    Last Pain:  Vitals:   01/23/22 1245  TempSrc:   PainSc: 2                  Fern Asmar L Gladis Soley

## 2022-01-23 NOTE — Anesthesia Procedure Notes (Signed)
Procedure Name: LMA Insertion Date/Time: 01/23/2022 9:35 AM  Performed by: Sanja Elizardo, Ernesta Amble, CRNAPre-anesthesia Checklist: Patient identified, Emergency Drugs available, Suction available and Patient being monitored Patient Re-evaluated:Patient Re-evaluated prior to induction Oxygen Delivery Method: Circle system utilized Preoxygenation: Pre-oxygenation with 100% oxygen Induction Type: IV induction Ventilation: Mask ventilation without difficulty LMA: LMA inserted LMA Size: 4.0 Number of attempts: 1 Airway Equipment and Method: Bite block Placement Confirmation: positive ETCO2 Tube secured with: Tape Dental Injury: Teeth and Oropharynx as per pre-operative assessment

## 2022-01-23 NOTE — Anesthesia Procedure Notes (Signed)
Anesthesia Regional Block: Adductor canal block   Pre-Anesthetic Checklist: , timeout performed,  Correct Patient, Correct Site, Correct Laterality,  Correct Procedure, Correct Position, site marked,  Risks and benefits discussed,  Pre-op evaluation,  At surgeon's request and post-op pain management  Laterality: Right  Prep: Maximum Sterile Barrier Precautions used, chloraprep       Needles:  Injection technique: Single-shot  Needle Type: Echogenic Stimulator Needle     Needle Length: 9cm  Needle Gauge: 21     Additional Needles:   Procedures:,,,, ultrasound used (permanent image in chart),,    Narrative:  Start time: 01/23/2022 9:15 AM End time: 01/23/2022 9:19 AM Injection made incrementally with aspirations every 5 mL. Anesthesiologist: Freddrick March, MD

## 2022-01-23 NOTE — Progress Notes (Signed)
Assisted Dr. Lanetta Inch with right, adductor canal, ultrasound guided block. Side rails up, monitors on throughout procedure. See vital signs in flow sheet. Tolerated Procedure well.

## 2022-01-23 NOTE — Brief Op Note (Signed)
   Brief Op Note  Date of Surgery: 01/23/2022  Preoperative Diagnosis: RECURRENT RIGHT PATELLAR INSTABILITY, RIGHT PIGMENTED VILLONODULAR SYNOVITIS  Postoperative Diagnosis: same  Procedure: Procedure(s): RIGHT KNEE PATELLOFEMORAL LIGAMENT RECONSTRUCTION RIGHT KNEE ARTHROSCOPY WITH DEBRIDEMENT  Implants: Implant Name Type Inv. Item Serial No. Manufacturer Lot No. LRB No. Used Action  TENDON GRACILIS FROZEN - [id number] Bone Implant TENDON GRACILIS FROZEN [id number] [employer] [id number] Right 1 Implanted  SYSTEM IMPLANT FDL 4.75 - [id number] Anchor SYSTEM IMPLANT FDL 4.75  [employer] [id number] Right 1 Implanted  ANCHOR PEEK 4.75X19.1 SWLK C - [id number] Anchor ANCHOR PEEK 4.75X19.1 Gertha Calkin  [employer] [id number] Right 1 Implanted  ANCHOR SUT FBRTK 2.6 KNTLS - [id number] Anchor ANCHOR SUT FBRTK 2.6 [employer] [id number] Right 2 Implanted    Surgeons: Surgeon(s): Vanetta Mulders, MD  Anesthesia: General    Estimated Blood Loss: See anesthesia record  Complications: None  Condition to PACU: Stable  Yevonne Pax, MD 01/23/2022 11:30 AM

## 2022-01-23 NOTE — Interval H&P Note (Signed)
History and Physical Interval Note:  01/23/2022 8:45 AM  Sarah Barber  has presented today for surgery, with the diagnosis of RECURRENT RIGHT PATELLAR INSTABILITY, RIGHT PIGMENTED VILLONODULAR SYNOVITIS.  The various methods of treatment have been discussed with the patient and family. After consideration of risks, benefits and other options for treatment, the patient has consented to  Procedure(s): RIGHT KNEE PATELLOFEMORAL LIGAMENT RECONSTRUCTION (Right) RIGHT KNEE ARTHROSCOPY WITH DEBRIDEMENT (Right) as a surgical intervention.  The patient's history has been reviewed, patient examined, no change in status, stable for surgery.  I have reviewed the patient's chart and labs.  Questions were answered to the patient's satisfaction.     Vanetta Mulders

## 2022-01-23 NOTE — Discharge Instructions (Addendum)
Discharge Instructions    Attending Surgeon: Vanetta Mulders, MD Office Phone Number: [phone]   Diagnosis and Procedures:    Surgeries Performed: Right knee debridement with MPFL reconstruction  Discharge Plan:    Diet: Resume usual diet. Begin with light or bland foods.  Drink plenty of fluids.  Activity:  Weight bearing as tolerated on right leg with brace locked straight. You are advised to go home directly from the hospital or surgical center. Restrict your activities.  GENERAL INSTRUCTIONS: 1.  Please apply ice to your wound to help with swelling and inflammation. This will improve your comfort and your overall recovery following surgery.     2. Please call Dr. Eddie Dibbles office at [phone] with questions Monday-Friday during business hours. If no one answers, please leave a message and someone should get back to the patient within 24 hours. For emergencies please call 911 or proceed to the emergency room.   3. Patient to notify surgical team if experiences any of the following: Bowel/Bladder dysfunction, uncontrolled pain, nerve/muscle weakness, incision with increased drainage or redness, nausea/vomiting and Fever greater than 101.0 F.  Be alert for signs of infection including redness, streaking, odor, fever or chills. Be alert for excessive pain or bleeding and notify your surgeon immediately.  WOUND INSTRUCTIONS:   Leave your dressing, cast, or splint in place until your post operative visit.  Keep it clean and dry.  Always keep the incision clean and dry until the staples/sutures are removed. If there is no drainage from the incision you should keep it open to air. If there is drainage from the incision you must keep it covered at all times until the drainage stops  Do not soak in a bath tub, hot tub, pool, lake or other body of water until 21 days after your surgery and your incision is completely dry and healed.  If you have removable sutures (or  staples) they must be removed 10-14 days (unless otherwise instructed) from the day of your surgery.     1)  Elevate the extremity as much as possible.  2)  Keep the dressing clean and dry.  3)  Please call us if the dressing becomes wet or dirty.  4)  If you are experiencing worsening pain or worsening swelling, please call.     MEDICATIONS: Resume all previous home medications at the previous prescribed dose and frequency unless otherwise noted Start taking the  pain medications on an as-needed basis as prescribed  Please taper down pain medication over the next week following surgery.  Ideally you should not require a refill of any narcotic pain medication.  Take pain medication with food to minimize nausea. In addition to the prescribed pain medication, you may take over-the-counter pain relievers such as Tylenol.  Do NOT take additional tylenol if your pain medication already has tylenol in it.  Aspirin '325mg'$  daily for four weeks.      FOLLOWUP INSTRUCTIONS: 1. Follow up at the Physical Therapy Clinic 3-4 days following surgery. This appointment should be scheduled unless other arrangements have been made.The Physical Therapy scheduling number is [phone] if an appointment has not already been arranged.  2. Contact Dr. Eddie Dibbles office during office hours at [phone] or the practice after hours line at [phone] for non-emergencies. For medical emergencies call 911.   Discharge Location: Home    Post Anesthesia Home Care Instructions  Activity: Get plenty of rest for the remainder of the day. A responsible individual must stay  with you for 24 hours following the procedure.  For the next 24 hours, DO NOT: -Drive a car -Paediatric nurse -Drink alcoholic beverages -Take any medication unless instructed by your physician -Make any legal decisions or sign important papers.  Meals: Start with liquid foods such as gelatin or soup. Progress to regular foods as  tolerated. Avoid greasy, spicy, heavy foods. If nausea and/or vomiting occur, drink only clear liquids until the nausea and/or vomiting subsides. Call your physician if vomiting continues.  Special Instructions/Symptoms: Your throat may feel dry or sore from the anesthesia or the breathing tube placed in your throat during surgery. If this causes discomfort, gargle with warm salt water. The discomfort should disappear within 24 hours.  If you had a scopolamine patch placed behind your ear for the management of post- operative nausea and/or vomiting:  1. The medication in the patch is effective for 72 hours, after which it should be removed.  Wrap patch in a tissue and discard in the trash. Wash hands thoroughly with soap and water. 2. You may remove the patch earlier than 72 hours if you experience unpleasant side effects which may include dry mouth, dizziness or visual disturbances. 3. Avoid touching the patch. Wash your hands with soap and water after contact with the patch.      Regional Anesthesia Blocks  1. Numbness or the inability to move the "blocked" extremity may last from 3-48 hours after placement. The length of time depends on the medication injected and your individual response to the medication. If the numbness is not going away after 48 hours, call your surgeon.  2. The extremity that is blocked will need to be protected until the numbness is gone and the  Strength has returned. Because you cannot feel it, you will need to take extra care to avoid injury. Because it may be weak, you may have difficulty moving it or using it. You may not know what position it is in without looking at it while the block is in effect.  3. For blocks in the legs and feet, returning to weight bearing and walking needs to be done carefully. You will need to wait until the numbness is entirely gone and the strength has returned. You should be able to move your leg and foot normally before you try and bear  weight or walk. You will need someone to be with you when you first try to ensure you do not fall and possibly risk injury.  4. Bruising and tenderness at the needle site are common side effects and will resolve in a few days.  5. Persistent numbness or new problems with movement should be communicated to the surgeon or the [place] [phone] [place] [phone]).   Regional Anesthesia Blocks  1. Numbness or the inability to move the "blocked" extremity may last from 3-48 hours after placement. The length of time depends on the medication injected and your individual response to the medication. If the numbness is not going away after 48 hours, call your surgeon.  2. The extremity that is blocked will need to be protected until the numbness is gone and the  Strength has returned. Because you cannot feel it, you will need to take extra care to avoid injury. Because it may be weak, you may have difficulty moving it or using it. You may not know what position it is in without looking at it while the block is in effect.  3. For blocks in  the legs and feet, returning to weight bearing and walking needs to be done carefully. You will need to wait until the numbness is entirely gone and the strength has returned. You should be able to move your leg and foot normally before you try and bear weight or walk. You will need someone to be with you when you first try to ensure you do not fall and possibly risk injury.  4. Bruising and tenderness at the needle site are common side effects and will resolve in a few days.  5. Persistent numbness or new problems with movement should be communicated to the surgeon or the [place] [phone] [place] [phone]).  *May have Tylenol or Ibuprofen at 1:30pm today 01/23/22

## 2022-01-24 ENCOUNTER — Encounter (HOSPITAL_BASED_OUTPATIENT_CLINIC_OR_DEPARTMENT_OTHER): Payer: Self-pay | Admitting: Orthopaedic Surgery

## 2022-01-24 ENCOUNTER — Other Ambulatory Visit (HOSPITAL_BASED_OUTPATIENT_CLINIC_OR_DEPARTMENT_OTHER): Payer: Self-pay | Admitting: Orthopaedic Surgery

## 2022-01-24 DIAGNOSIS — M25361 Other instability, right knee: Secondary | ICD-10-CM

## 2022-01-24 LAB — SURGICAL PATHOLOGY

## 2022-01-24 MED ORDER — OXYCODONE HCL 5 MG PO TABS: 5.0000 mg | ORAL_TABLET | ORAL | Status: AC | PRN

## 2022-01-25 ENCOUNTER — Telehealth: Payer: Self-pay | Admitting: Orthopaedic Surgery

## 2022-01-25 ENCOUNTER — Other Ambulatory Visit (HOSPITAL_BASED_OUTPATIENT_CLINIC_OR_DEPARTMENT_OTHER): Payer: Self-pay | Admitting: Orthopaedic Surgery

## 2022-01-25 MED ORDER — OXYCODONE HCL 5 MG PO TABS: 5.0000 mg | ORAL_TABLET | ORAL | 0 refills | Status: DC | PRN

## 2022-01-25 NOTE — Telephone Encounter (Signed)
Pt called requesting a refill of oxycodone. Please send to pharmacy on file. Pt phone number is [phone].

## 2022-01-26 ENCOUNTER — Ambulatory Visit (HOSPITAL_BASED_OUTPATIENT_CLINIC_OR_DEPARTMENT_OTHER): Payer: Medicaid Other | Admitting: Physical Therapy

## 2022-01-26 ENCOUNTER — Encounter (HOSPITAL_BASED_OUTPATIENT_CLINIC_OR_DEPARTMENT_OTHER): Payer: Self-pay | Admitting: Physical Therapy

## 2022-01-26 ENCOUNTER — Ambulatory Visit (HOSPITAL_BASED_OUTPATIENT_CLINIC_OR_DEPARTMENT_OTHER): Payer: Self-pay | Admitting: Physical Therapy

## 2022-01-26 DIAGNOSIS — M25561 Pain in right knee: Secondary | ICD-10-CM

## 2022-01-26 DIAGNOSIS — M6281 Muscle weakness (generalized): Secondary | ICD-10-CM

## 2022-01-26 DIAGNOSIS — M25661 Stiffness of right knee, not elsewhere classified: Secondary | ICD-10-CM

## 2022-01-26 DIAGNOSIS — R262 Difficulty in walking, not elsewhere classified: Secondary | ICD-10-CM

## 2022-01-26 NOTE — Therapy (Signed)
OUTPATIENT PHYSICAL THERAPY LOWER EXTREMITY RE-EVALUATION & TREATMENT   Patient Name: Sarah Barber MRN: [id number] DOB:[birth year], 46 y.o., female Today's Date: 01/26/2022   PT End of Session - 01/26/22 1200     Visit Number 5    Number of Visits 27    Authorization Type [employer]    PT Start Time 1145    PT Stop Time 1228    PT Time Calculation (min) 43 min    Activity Tolerance Patient tolerated treatment well    Behavior During Therapy WFL for tasks assessed/performed             Past Medical History:  Diagnosis Date   Allergies    Anxiety    Bipolar disorder (HCC)    Chronic headaches    Depression    Fatigue    Menstrual pain    Panic attacks    PTSD (post-traumatic stress disorder)    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   Vision changes    Past Surgical History:  Procedure Laterality Date   CESAREAN SECTION      x 2   HYSTERECTOMY ABDOMINAL WITH SALPINGECTOMY Bilateral 07/28/2019   Procedure: HYSTERECTOMY ABDOMINAL WITH BILATERAL SALPINGECTOMY AND RIGHT OOPHORECTOMY;  Surgeon: [name], MD;  Location: [facility];  Service: Gynecology;  Laterality: Bilateral;   KNEE ARTHROSCOPY Right 01/23/2022   Procedure: RIGHT KNEE ARTHROSCOPY WITH DEBRIDEMENT;  Surgeon: Vanetta Mulders, MD;  Location: [facility];  Service: Orthopedics;  Laterality: Right;   KNEE RECONSTRUCTION Right 01/23/2022   Procedure: RIGHT KNEE PATELLOFEMORAL LIGAMENT RECONSTRUCTION;  Surgeon: Vanetta Mulders, MD;  Location: [facility];  Service: Orthopedics;  Laterality: Right;   Patient Active Problem List   Diagnosis Date Noted   PVNS (pigmented villonodular synovitis)    Patellar instability of right knee    Status post hysterectomy 07/28/2019   Anemia due to acute blood loss 07/23/2019   Fibroid uterus 04/27/2019   Family history of breast cancer in first degree relative 04/27/2019   Abnormal uterine bleeding 04/27/2019     REFERRING PROVIDER: Vanetta Mulders, MD  REFERRING DIAG: M25.361 (ICD-10-CM) - Patellar instability of right knee  THERAPY DIAG:  Acute pain of right knee  Stiffness of right knee, not elsewhere classified  Muscle weakness (generalized)  Difficulty in walking, not elsewhere classified  Rationale for Evaluation and Treatment Rehabilitation  ONSET DATE: 01/23/22  RIGHT KNEE PATELLOFEMORAL LIGAMENT RECONSTRUCTION Right General  RIGHT KNEE ARTHROSCOPY WITH DEBRIDEMENT      SUBJECTIVE:   SUBJECTIVE STATEMENT: It is straighter than it was before the surgery.   PERTINENT HISTORY: H/o c-section  PAIN:  Are you having pain? Yes: NPRS scale: 6/10 Pain location: Rt knee Pain description: discomfort Aggravating factors: bending Relieving factors: ice  PRECAUTIONS: Knee  WEIGHT BEARING RESTRICTIONS Yes as tolerated in brace & crutches  FALLS:  Has patient fallen in last 6 months? No  LIVING ENVIRONMENT: Lives with: lives with their family Lives in: House/apartment  PLOF: Independent  PATIENT GOALS decrease pain   OBJECTIVE:    PATIENT SURVEYS:  LEFS 3  COGNITION:  Overall cognitive status: Within functional limits for tasks assessed     SENSATION: N/T around knee as expected post op  EDEMA:  Increased edema at surgical site as expected    POSTURE: flexed posture with crutches- corrected grips for proper height    LOWER EXTREMITY ROM:  Active ROM Right eval  Knee flexion 40   Knee extension 0   Ankle dorsiflexion    Ankle plantarflexion    Ankle inversion    Ankle eversion     (Blank rows = not tested)  LOWER EXTREMITY MMT:  Able to demo quad activation without ability to maintain   GAIT: Distance walked: pt arrived in [facility], able to demo gait with bil axillary crutches with TTWB     TODAY'S TREATMENT: EVAL: Quad set Ankle pumps Heel slides PT changed bandages, fit brace & crutches Gait training with crutches   PATIENT  EDUCATION:  Education details: Anatomy of condition, POC, HEP, exercise form/rationale  Person educated: Patient Education method: Explanation, Demonstration, Tactile cues, Verbal cues, and Handouts Education comprehension: verbalized understanding, returned demonstration, verbal cues required, tactile cues required, and needs further education   HOME EXERCISE PROGRAM: [id number]  ASSESSMENT:  CLINICAL IMPRESSION: Patient is a 46 y.o. F who was seen today for physical therapy evaluation and treatment for rehab following patellar reconstruction.    OBJECTIVE IMPAIRMENTS decreased activity tolerance, difficulty walking, decreased ROM, decreased strength, increased edema, increased muscle spasms, impaired flexibility, improper body mechanics, and pain.   ACTIVITY LIMITATIONS lifting, bending, standing, squatting, stairs, transfers, bed mobility, bathing, dressing, locomotion level, and caring for others  PARTICIPATION LIMITATIONS: meal prep, cleaning, laundry, driving, shopping, and community activity  PERSONAL FACTORS 1 comorbidity: h/o c-section limiting core stability  are also affecting patient's functional outcome.   REHAB POTENTIAL: Good  CLINICAL DECISION MAKING: Stable/uncomplicated  EVALUATION COMPLEXITY: Low   GOALS: Goals reviewed with patient? Yes  SHORT TERM GOALS: Target date: 02/26/2022  Knee comfortably to 0-130 Baseline:see table Goal status: INITIAL  2.  Able to hold a quad activation for 20s Baseline: unable at eval Goal status: INITIAL   LONG TERM GOALS: Target date: 04/23/2022   Able to navigate stairs step over step pattern without pain Baseline: unable at eval Goal status: INITIAL  2.  LEFS score to improve within 85% of full function Baseline: see obj Goal status: INITIAL  3.  Gross LE strength 90% compared to contralateral LE via hand held dynamometry testing Baseline: unable to test at eval Goal status: INITIAL    PLAN: PT FREQUENCY:  1-2x/week  PT DURATION: 12 weeks  PLANNED INTERVENTIONS: Therapeutic exercises, Therapeutic activity, Neuromuscular re-education, Balance training, Gait training, Patient/Family education, Self Care, Joint mobilization, Stair training, Aquatic Therapy, Dry Needling, Electrical stimulation, Cryotherapy, Moist heat, Taping, Vasopneumatic device, Ionotophoresis '4mg'$ /ml Dexamethasone, Manual therapy, and Re-evaluation  PLAN FOR NEXT SESSION: continue to progress strength, ROM and WB as tol   Gina Leblond C. Maclin Guerrette PT, DPT 01/29/22 9:26 PM

## 2022-01-30 NOTE — Therapy (Signed)
OUTPATIENT PHYSICAL THERAPY TREATMENT   Patient Name: Sarah Barber MRN: [id number] DOB:Sep 08, 1975, 46 y.o., female Today's Date: 02/01/2022   PT End of Session - 01/31/22 0826     Visit Number 6    Number of Visits 27    Date for PT Re-Evaluation 04/21/22    Authorization Type [employer] blue    PT Start Time 0815   Pt arrived late to Rx due to transportation issues   PT Stop Time 0850    PT Time Calculation (min) 35 min    Equipment Utilized During Treatment --   bilat crutches   Activity Tolerance Patient tolerated treatment well    Behavior During Therapy WFL for tasks assessed/performed              Past Medical History:  Diagnosis Date   Allergies    Anxiety    Bipolar disorder (HCC)    Chronic headaches    Depression    Fatigue    Menstrual pain    Panic attacks    PTSD (post-traumatic stress disorder)    Seizure ([place])    as a child - last one at age 61- petite mal   Vision changes    Past Surgical History:  Procedure Laterality Date   CESAREAN SECTION      x 2   HYSTERECTOMY ABDOMINAL WITH SALPINGECTOMY Bilateral 07/28/2019   Procedure: HYSTERECTOMY ABDOMINAL WITH BILATERAL SALPINGECTOMY AND RIGHT OOPHORECTOMY;  Surgeon: [name], MD;  Location: [facility];  Service: Gynecology;  Laterality: Bilateral;   KNEE ARTHROSCOPY Right 01/23/2022   Procedure: RIGHT KNEE ARTHROSCOPY WITH DEBRIDEMENT;  Surgeon: Vanetta Mulders, MD;  Location: [facility];  Service: Orthopedics;  Laterality: Right;   KNEE RECONSTRUCTION Right 01/23/2022   Procedure: RIGHT KNEE PATELLOFEMORAL LIGAMENT RECONSTRUCTION;  Surgeon: Vanetta Mulders, MD;  Location: [facility];  Service: Orthopedics;  Laterality: Right;   Patient Active Problem List   Diagnosis Date Noted   PVNS (pigmented villonodular synovitis)    Patellar instability of right knee    Status post hysterectomy 07/28/2019   Anemia due to acute blood loss 07/23/2019   Fibroid uterus  04/27/2019   Family history of breast cancer in first degree relative 04/27/2019   Abnormal uterine bleeding 04/27/2019    REFERRING PROVIDER: Vanetta Mulders, MD  REFERRING DIAG: M25.361 (ICD-10-CM) - Patellar instability of right knee  THERAPY DIAG:  Acute pain of right knee  Stiffness of right knee, not elsewhere classified  Muscle weakness (generalized)  Difficulty in walking, not elsewhere classified  Rationale for Evaluation and Treatment Rehabilitation  ONSET DATE: 01/23/22  RIGHT KNEE PATELLOFEMORAL LIGAMENT RECONSTRUCTION Right General  RIGHT KNEE ARTHROSCOPY WITH DEBRIDEMENT      SUBJECTIVE:   SUBJECTIVE STATEMENT: Pt is 8 days s/p Right knee medial patellofemoral ligament reconstruction with gracilis allograft and limited synovectomy.  Post op orders indicated WBAT with the right leg in extension.  Pt states her knee is swollen.  She states she has been in a lot of pain this week ranging from 7-9/10.  Pt reports she had increased pain after prior Rx.  Pt reports she has been performing AP's and quad sets though not knee flexion AAROM. Pt states she thinks she may have put too much pressure on R LE when getting into/out of shower and her knee has had increased swelling since.  Pt has difficulty sleeping.  Pt states MD removed the lesion from her knee and it was benign.    PERTINENT HISTORY:  H/o c-section Hx of patellar instability  PAIN:  Are you having pain? Yes: NPRS scale: 6-7/10 Pain location: Rt knee Pain description: discomfort Aggravating factors: bending Relieving factors: ice  PRECAUTIONS: Knee  WEIGHT BEARING RESTRICTIONS Yes as tolerated in brace & crutches  FALLS:  Has patient fallen in last 6 months? No  LIVING ENVIRONMENT: Lives with: lives with their family Lives in: House/apartment  PLOF: Independent  PATIENT GOALS decrease pain   OBJECTIVE:     EDEMA:  Increased edema at surgical site as expected    TODAY'S  TREATMENT: Therapeutic Exercise: Reviewed HEP Pt performed: Quad set x 10 reps with 5 sec hold Ankle pumps x 20 reps Pt performed R knee flexion PROM with hands under thigh x 10 reps  PT changed bandages.  Removed tegaderm and gauze and applied new gauze and tegaderm over incisions and portals   Gait training:  Educated pt concerning [place] status.  Instructed and demonstrated ambulating with bilat crutches.  Pt ambulated with brace locked at 0 deg with bilat crutches with cuing and instruction for sequencing and step placement.    PATIENT EDUCATION:  Education details: Anatomy of condition, POC, HEP, exercise form/rationale, Gait training, Wb'ing status.  PT educated pt with ROM limits and instructed her to not perform into a painful or tight range.  PT instructed pt to not perform into tension.   Person educated: Patient Education method: Explanation, Demonstration, Tactile cues, Verbal cues, and Handouts Education comprehension: verbalized understanding, returned demonstration, verbal cues required, tactile cues required, and needs further education   HOME EXERCISE PROGRAM: [id number]  ASSESSMENT:  CLINICAL IMPRESSION: PT educated pt with Wb'ing precautions and instructed pt in proper gait training.  Pt able to perform step to gait with bilat crutches with brace locked at 0 deg without increased pain.  Pt has deficits with quad activation though has improved with quad contraction since prehab.  PT reviewed HEP and pt demonstrates good understanding.  PT changed dressings and she has no signs of infection or excessive drainage.  Pt responded well to Rx having no increased pain after Rx.  Pt should benefit from cont skilled PT services per protocol to address impairments and goals and to assist with restoring desired level of function.    OBJECTIVE IMPAIRMENTS decreased activity tolerance, difficulty walking, decreased ROM, decreased strength, increased edema, increased muscle spasms,  impaired flexibility, improper body mechanics, and pain.   ACTIVITY LIMITATIONS lifting, bending, standing, squatting, stairs, transfers, bed mobility, bathing, dressing, locomotion level, and caring for others  PARTICIPATION LIMITATIONS: meal prep, cleaning, laundry, driving, shopping, and community activity  PERSONAL FACTORS 1 comorbidity: h/o c-section limiting core stability  are also affecting patient's functional outcome.   REHAB POTENTIAL: Good  CLINICAL DECISION MAKING: Stable/uncomplicated  EVALUATION COMPLEXITY: Low   GOALS: Goals reviewed with patient? Yes  SHORT TERM GOALS: Target date: 02/26/2022  Knee comfortably to 0-130 Baseline:see table Goal status: INITIAL  2.  Able to hold a quad activation for 20s Baseline: unable at eval Goal status: INITIAL   LONG TERM GOALS: Target date: 04/23/2022   Able to navigate stairs step over step pattern without pain Baseline: unable at eval Goal status: INITIAL  2.  LEFS score to improve within 85% of full function Baseline: see obj Goal status: INITIAL  3.  Gross LE strength 90% compared to contralateral LE via hand held dynamometry testing Baseline: unable to test at eval Goal status: INITIAL    PLAN: PT FREQUENCY: 1-2x/week  PT DURATION: 12 weeks  PLANNED INTERVENTIONS: Therapeutic exercises, Therapeutic activity, Neuromuscular re-education, Balance training, Gait training, Patient/Family education, Self Care, Joint mobilization, Stair training, Aquatic Therapy, Dry Needling, Electrical stimulation, Cryotherapy, Moist heat, Taping, Vasopneumatic device, Ionotophoresis '4mg'$ /ml Dexamethasone, Manual therapy, and Re-evaluation  PLAN FOR NEXT SESSION: continue per Dr. Eddie Dibbles MPFL reconstruction protocol.  WB as tol in brace locked at 0 deg.    Selinda Michaels III PT, DPT 02/01/22 8:33 AM

## 2022-01-31 ENCOUNTER — Ambulatory Visit (HOSPITAL_BASED_OUTPATIENT_CLINIC_OR_DEPARTMENT_OTHER): Payer: Medicaid Other | Admitting: Physical Therapy

## 2022-01-31 ENCOUNTER — Encounter (HOSPITAL_BASED_OUTPATIENT_CLINIC_OR_DEPARTMENT_OTHER): Payer: Self-pay | Admitting: Physical Therapy

## 2022-01-31 DIAGNOSIS — R262 Difficulty in walking, not elsewhere classified: Secondary | ICD-10-CM

## 2022-01-31 DIAGNOSIS — M6281 Muscle weakness (generalized): Secondary | ICD-10-CM

## 2022-01-31 DIAGNOSIS — M25561 Pain in right knee: Secondary | ICD-10-CM

## 2022-01-31 DIAGNOSIS — M25661 Stiffness of right knee, not elsewhere classified: Secondary | ICD-10-CM

## 2022-02-05 ENCOUNTER — Encounter (HOSPITAL_BASED_OUTPATIENT_CLINIC_OR_DEPARTMENT_OTHER): Payer: Medicaid Other | Admitting: Orthopaedic Surgery

## 2022-02-05 ENCOUNTER — Encounter: Payer: Self-pay | Admitting: Psychiatry

## 2022-02-05 ENCOUNTER — Ambulatory Visit: Payer: Medicaid Other | Admitting: Psychiatry

## 2022-02-05 NOTE — Progress Notes (Deleted)
   CC:  headaches  Follow-up Visit  Last visit: 09/25/21  Brief HPI: 46 year old female with a history of fibroids who follows in clinic for migraines following a head trauma in 2015.  At her last visit Topamax was increase to 100 mg QHS and she was started on Zomig for rescue.  Interval History: ***   Headache days per month: *** Headache free days per month: *** Headache severity: ***  Current Headache Regimen: Preventative: *** Abortive: ***  # of doses of abortive medications per month: ***  Prior Therapies                                  Maxalt 10 mg PRN - drowsiness Topamax 25 mg QHS  Physical Exam:   Vital Signs: LMP 07/09/2019 (Exact Date)  GENERAL:  well appearing, in no acute distress, alert  SKIN:  Color, texture, turgor normal. No rashes or lesions HEAD:  Normocephalic/atraumatic. RESP: normal respiratory effort MSK:  No gross joint deformities.   NEUROLOGICAL: Mental Status: Alert, oriented to person, place and time, Follows commands, and Speech fluent and appropriate. Cranial Nerves: PERRL, face symmetric, no dysarthria, hearing grossly intact Motor: moves all extremities equally Gait: normal-based.  IMPRESSION: ***  PLAN: ***   Follow-up: ***  I spent a total of *** minutes on the date of the service. Headache education was done. Discussed lifestyle modification including increased oral hydration, decreased caffeine, exercise and stress management. Discussed treatment options including preventive and acute medications, natural supplements, and infusion therapy. Discussed medication overuse headache and to limit use of acute treatments to no more than 2 days/week or 10 days/month. Discussed medication side effects, adverse reactions and drug interactions. Written educational materials and patient instructions outlining all of the above were given.  Genia Harold, MD

## 2022-02-07 ENCOUNTER — Ambulatory Visit (INDEPENDENT_AMBULATORY_CARE_PROVIDER_SITE_OTHER): Payer: Medicaid Other | Admitting: Orthopaedic Surgery

## 2022-02-07 ENCOUNTER — Encounter (HOSPITAL_BASED_OUTPATIENT_CLINIC_OR_DEPARTMENT_OTHER): Payer: Self-pay | Admitting: Physical Therapy

## 2022-02-07 DIAGNOSIS — M25361 Other instability, right knee: Secondary | ICD-10-CM

## 2022-02-07 MED ORDER — IBUPROFEN 800 MG PO TABS: 800.0000 mg | ORAL_TABLET | Freq: Three times a day (TID) | ORAL | 0 refills | Status: AC

## 2022-02-07 NOTE — Progress Notes (Signed)
Post Operative Evaluation    Procedure/Date of Surgery: Right knee MPFL reconstruction 7/18  Interval History:    Presents today 2 weeks status post right knee MPFL reconstruction.  She has been working in physical therapy twice weekly.  She has been working with tray.  She has been working on progressing her weightbearing at this time.  She is still using 2 crutches.  She has been in her hinged knee brace.  She is taking anticoagulation as prescribed.   PMH/PSH/Family History/Social History/Meds/Allergies:    Past Medical History:  Diagnosis Date   Allergies    Anxiety    Bipolar disorder (HCC)    Chronic headaches    Depression    Fatigue    Menstrual pain    Panic attacks    PTSD (post-traumatic stress disorder)    Seizure ([place])    as a child - last one at age 52- petite mal   Vision changes    Past Surgical History:  Procedure Laterality Date   CESAREAN SECTION      x 2   HYSTERECTOMY ABDOMINAL WITH SALPINGECTOMY Bilateral 07/28/2019   Procedure: HYSTERECTOMY ABDOMINAL WITH BILATERAL SALPINGECTOMY AND RIGHT OOPHORECTOMY;  Surgeon: [name], MD;  Location: [facility];  Service: Gynecology;  Laterality: Bilateral;   KNEE ARTHROSCOPY Right 01/23/2022   Procedure: RIGHT KNEE ARTHROSCOPY WITH DEBRIDEMENT;  Surgeon: Vanetta Mulders, MD;  Location: [facility];  Service: Orthopedics;  Laterality: Right;   KNEE RECONSTRUCTION Right 01/23/2022   Procedure: RIGHT KNEE PATELLOFEMORAL LIGAMENT RECONSTRUCTION;  Surgeon: Vanetta Mulders, MD;  Location: [place] [facility];  Service: Orthopedics;  Laterality: Right;   Social History   Socioeconomic History   Marital status: Single    Spouse name: Not on file   Number of children: Not on file   Years of education: Not on file   Highest education level: Not on file  Occupational History   Not on file  Tobacco Use   Smoking status: Former    Types: Cigarettes    Quit date:  09/2015    Years since quitting: 6.4   Smokeless tobacco: Never   Tobacco comments:    smoked off and on  Vaping Use   Vaping Use: Never used  Substance and Sexual Activity   Alcohol use: Yes    Comment: occasional   Drug use: Never   Sexual activity: Not on file  Other Topics Concern   Not on file  Social History Narrative   Right handed    Caffeine- rare    Social Determinants of Health   Financial Resource Strain: Not on file  Food Insecurity: Not on file  Transportation Needs: Not on file  Physical Activity: Not on file  Stress: Not on file  Social Connections: Not on file   Family History  Problem Relation Age of Onset   Diabetes Mother    Cerebrovascular Accident Father        x2   Diabetes Maternal Grandmother    Kidney disease Maternal Grandmother    Lung cancer Maternal Grandfather    Breast cancer Neg Hx    Allergies  Allergen Reactions   No Known Allergies Other (See Comments)   Pineapple Nausea And Vomiting   Other Rash    pineapple,cabbage   Current Outpatient Medications  Medication Sig  Dispense Refill   aspirin EC 325 MG tablet Take 1 tablet (325 mg total) by mouth daily. 30 tablet 0   butalbital-acetaminophen-caffeine (FIORICET) 50-325-40 MG tablet Take by mouth 2 (two) times daily as needed for headache.     lidocaine (LIDODERM) 5 % Place 1 patch onto the skin daily. Remove & Discard patch within 12 hours or as directed by MD 30 patch 0   oxycodone (OXY-IR) 5 MG capsule Take 1 capsule (5 mg total) by mouth every 4 (four) hours as needed (severe pain). 20 capsule 0   oxyCODONE (ROXICODONE) 5 MG immediate release tablet Take 1 tablet (5 mg total) by mouth every 4 (four) hours as needed for severe pain. 30 tablet 0   topiramate (TOPAMAX) 25 MG tablet Take 50 mg at bedtime for one week, then increase to 75 mg at bedtime for one week, then increase to 100 mg at bedtime 120 tablet 3   zolmitriptan (ZOMIG) 5 MG tablet Take 1 tablet (5 mg total) by mouth  as needed for migraine. 10 tablet 3   Current Facility-Administered Medications  Medication Dose Route Frequency Provider Last Rate Last Admin   oxyCODONE (Oxy IR/ROXICODONE) immediate release tablet 5 mg  5 mg Oral Q4H PRN Vanetta Mulders, MD       No results found.  Review of Systems:   A ROS was performed including pertinent positives and negatives as documented in the HPI.   Musculoskeletal Exam:     Right knee incisions are well-appearing.  No erythema or drainage.  Range of motion is from 2 degrees to approximately 90 degrees.  Brace is in place.  Patella mobility deferred today.  Distal neurosensory exam is intact  Imaging:    None  I personally reviewed and interpreted the radiographs.   Assessment:   46 year old female who is 2 weeks status post MPFL reconstruction overall doing very well.  I would like her to continue to advance according to the MPFL reconstruction protocol.  She will continue to advance her weightbearing as tolerated.  All restrictions and activity modifications were discussed.  I will plan to see her back in 4 weeks  Plan :    -Return to clinic 4 weeks      I personally saw and evaluated the patient, and participated in the management and treatment plan.  Vanetta Mulders, MD Attending Physician, Orthopedic Surgery  This document was dictated using Dragon voice recognition software. A reasonable attempt at proof reading has been made to minimize errors.

## 2022-02-08 ENCOUNTER — Ambulatory Visit (HOSPITAL_BASED_OUTPATIENT_CLINIC_OR_DEPARTMENT_OTHER): Payer: Medicaid Other | Admitting: Physical Therapy

## 2022-02-14 ENCOUNTER — Ambulatory Visit (HOSPITAL_BASED_OUTPATIENT_CLINIC_OR_DEPARTMENT_OTHER): Payer: Medicaid Other | Attending: Orthopaedic Surgery | Admitting: Physical Therapy

## 2022-02-14 DIAGNOSIS — R262 Difficulty in walking, not elsewhere classified: Secondary | ICD-10-CM | POA: Insufficient documentation

## 2022-02-14 DIAGNOSIS — M6281 Muscle weakness (generalized): Secondary | ICD-10-CM | POA: Insufficient documentation

## 2022-02-14 DIAGNOSIS — M25561 Pain in right knee: Secondary | ICD-10-CM | POA: Diagnosis present

## 2022-02-14 DIAGNOSIS — M25661 Stiffness of right knee, not elsewhere classified: Secondary | ICD-10-CM | POA: Diagnosis present

## 2022-02-14 NOTE — Therapy (Signed)
OUTPATIENT PHYSICAL THERAPY TREATMENT   Patient Name: Sarah Barber MRN: [id number] DOB:12/18/1975, 46 y.o., female Today's Date: 02/14/2022      Past Medical History:  Diagnosis Date   Allergies    Anxiety    Bipolar disorder (HCC)    Chronic headaches    Depression    Fatigue    Menstrual pain    Panic attacks    PTSD (post-traumatic stress disorder)    Seizure ([place])    as a child - last one at age 19- petite mal   Vision changes    Past Surgical History:  Procedure Laterality Date   CESAREAN SECTION      x 2   HYSTERECTOMY ABDOMINAL WITH SALPINGECTOMY Bilateral 07/28/2019   Procedure: HYSTERECTOMY ABDOMINAL WITH BILATERAL SALPINGECTOMY AND RIGHT OOPHORECTOMY;  Surgeon: [name], MD;  Location: [facility];  Service: Gynecology;  Laterality: Bilateral;   KNEE ARTHROSCOPY Right 01/23/2022   Procedure: RIGHT KNEE ARTHROSCOPY WITH DEBRIDEMENT;  Surgeon: Vanetta Mulders, MD;  Location: [facility];  Service: Orthopedics;  Laterality: Right;   KNEE RECONSTRUCTION Right 01/23/2022   Procedure: RIGHT KNEE PATELLOFEMORAL LIGAMENT RECONSTRUCTION;  Surgeon: Vanetta Mulders, MD;  Location: [facility];  Service: Orthopedics;  Laterality: Right;   Patient Active Problem List   Diagnosis Date Noted   PVNS (pigmented villonodular synovitis)    Patellar instability of right knee    Status post hysterectomy 07/28/2019   Anemia due to acute blood loss 07/23/2019   Fibroid uterus 04/27/2019   Family history of breast cancer in first degree relative 04/27/2019   Abnormal uterine bleeding 04/27/2019    REFERRING PROVIDER: Vanetta Mulders, MD  REFERRING DIAG: M25.361 (ICD-10-CM) - Patellar instability of right knee  THERAPY DIAG:  No diagnosis found.  Rationale for Evaluation and Treatment Rehabilitation  ONSET DATE: 01/23/22  RIGHT KNEE PATELLOFEMORAL LIGAMENT RECONSTRUCTION Right General  RIGHT KNEE ARTHROSCOPY WITH DEBRIDEMENT      SUBJECTIVE:    SUBJECTIVE STATEMENT: Pt is 3 weeks and 1 day s/p Right knee medial patellofemoral ligament reconstruction with gracilis allograft and limited synovectomy.  Pt saw MD last week and note indicted to continue to advance according to the MPFL reconstruction protocol and to advance her weightbearing as tolerated.  Pt states MD opened her brace to 60 deg last week.  She has been locking and unlocking her brace.   Pt states her knee is swollen.  She is icing her knee though her swelling is not improving.   Pt states she thinks she may have put too much pressure on R LE when getting into/out of shower a week out of surgery and her knee has had increased swelling since.     Pt reports compliance with HEP.    Pt has difficulty sleeping and is unable to lie on her sides.  Pt is limited with her normal functional mobility skills including ambulation and transfers.  Pt is walking more inside her home.  Pt walked with her crutches in [employer].  She states she was very tired and had increased pain later.  Pt reports her knee was hurting a lot this AM and is still hurting a lot now.  Pt has shooting pains in knee.  Pt reports she was sore and achy after prior Rx.  PERTINENT HISTORY: H/o c-section Hx of patellar instability  PAIN:  Are you having pain? Yes: NPRS scale: 8/10 Pain location: Rt knee Pain description: discomfort Aggravating factors: bending Relieving factors: ice  PRECAUTIONS:  Knee  WEIGHT BEARING RESTRICTIONS Yes as tolerated in brace & crutches  FALLS:  Has patient fallen in last 6 months? No  LIVING ENVIRONMENT: Lives with: lives with their family Lives in: House/apartment  PLOF: Independent  PATIENT GOALS decrease pain   OBJECTIVE:     EDEMA:  Increased edema at surgical site as expected    TODAY'S TREATMENT: Therapeutic Exercise: Reviewed response to prior Rx, current function, HEP compliance, and pain levels.   Pt received R knee flexion and  extension PROM in supine. Assessed ROM. Pt performed: Longsitting gastroc stretch 3x20 sec PF with GTB 2x10-15 reps Seated knee extension stretch with heel propped on stool x 3 mins Quad set x 10 reps with 5 sec hold  Pt performed R knee flexion PROM with hands under thigh x 10 reps  R knee extension AROM:  lacking 21 deg R knee flexion PROM:  55 deg R knee extension AROM:  (after exercises)  lacking 13 deg       Gait training:  Educated pt concerning [place] status.  Instructed and demonstrated ambulating with bilat crutches.  Pt ambulated with brace and with bilat crutches with cuing and instruction for TKE and heel strike.    PATIENT EDUCATION:  Education details: Anatomy of condition, POC, HEP, exercise form/rationale, Gait training, Wb'ing status.  PT educated pt with ROM limits and instructed her to not perform into a painful or tight range.  PT instructed pt to not perform into tension.   Person educated: Patient Education method: Explanation, Demonstration, Tactile cues, Verbal cues, and Handouts Education comprehension: verbalized understanding, returned demonstration, verbal cues required, tactile cues required, and needs further education   HOME EXERCISE PROGRAM: [id number]  ASSESSMENT:  CLINICAL IMPRESSION: PT educated pt with Wb'ing precautions and instructed pt in proper gait training.  Pt able to perform step to gait with bilat crutches with brace locked at 0 deg without increased pain.  Pt has deficits with quad activation though has improved with quad contraction since prehab.  PT reviewed HEP and pt demonstrates good understanding.  PT changed dressings and she has no signs of infection or excessive drainage.  Pt responded well to Rx having no increased pain after Rx.  Pt should benefit from cont skilled PT services per protocol to address impairments and goals and to assist with restoring desired level of function.   Limited TKE with gait, decreased Wb'ing but  improved overall  Guarded with PROM and required cuing to relax R LE with PROM.    OBJECTIVE IMPAIRMENTS decreased activity tolerance, difficulty walking, decreased ROM, decreased strength, increased edema, increased muscle spasms, impaired flexibility, improper body mechanics, and pain.   ACTIVITY LIMITATIONS lifting, bending, standing, squatting, stairs, transfers, bed mobility, bathing, dressing, locomotion level, and caring for others  PARTICIPATION LIMITATIONS: meal prep, cleaning, laundry, driving, shopping, and community activity  PERSONAL FACTORS 1 comorbidity: h/o c-section limiting core stability  are also affecting patient's functional outcome.   REHAB POTENTIAL: Good  CLINICAL DECISION MAKING: Stable/uncomplicated  EVALUATION COMPLEXITY: Low   GOALS: Goals reviewed with patient? Yes  SHORT TERM GOALS: Target date: 02/26/2022  Knee comfortably to 0-130 Baseline:see table Goal status: INITIAL  2.  Able to hold a quad activation for 20s Baseline: unable at eval Goal status: INITIAL   LONG TERM GOALS: Target date: 04/23/2022   Able to navigate stairs step over step pattern without pain Baseline: unable at eval Goal status: INITIAL  2.  LEFS score to improve within 85% of full function  Baseline: see obj Goal status: INITIAL  3.  Gross LE strength 90% compared to contralateral LE via hand held dynamometry testing Baseline: unable to test at eval Goal status: INITIAL    PLAN: PT FREQUENCY: 1-2x/week  PT DURATION: 12 weeks  PLANNED INTERVENTIONS: Therapeutic exercises, Therapeutic activity, Neuromuscular re-education, Balance training, Gait training, Patient/Family education, Self Care, Joint mobilization, Stair training, Aquatic Therapy, Dry Needling, Electrical stimulation, Cryotherapy, Moist heat, Taping, Vasopneumatic device, Ionotophoresis '4mg'$ /ml Dexamethasone, Manual therapy, and Re-evaluation  PLAN FOR NEXT SESSION: continue per Dr. Eddie Dibbles MPFL  reconstruction protocol.  WB as tol in brace locked at 0 deg.   Pt to return to MD in 3 weeks.   Selinda Michaels III PT, DPT 02/14/22 6:46 AM

## 2022-02-15 ENCOUNTER — Encounter (HOSPITAL_BASED_OUTPATIENT_CLINIC_OR_DEPARTMENT_OTHER): Payer: Self-pay | Admitting: Physical Therapy

## 2022-02-16 ENCOUNTER — Encounter (HOSPITAL_BASED_OUTPATIENT_CLINIC_OR_DEPARTMENT_OTHER): Payer: Self-pay | Admitting: Physical Therapy

## 2022-02-16 ENCOUNTER — Ambulatory Visit (HOSPITAL_BASED_OUTPATIENT_CLINIC_OR_DEPARTMENT_OTHER): Payer: Medicaid Other | Admitting: Physical Therapy

## 2022-02-16 DIAGNOSIS — M6281 Muscle weakness (generalized): Secondary | ICD-10-CM

## 2022-02-16 DIAGNOSIS — M25561 Pain in right knee: Secondary | ICD-10-CM | POA: Diagnosis not present

## 2022-02-16 DIAGNOSIS — R262 Difficulty in walking, not elsewhere classified: Secondary | ICD-10-CM

## 2022-02-16 DIAGNOSIS — M25661 Stiffness of right knee, not elsewhere classified: Secondary | ICD-10-CM

## 2022-02-16 NOTE — Therapy (Signed)
OUTPATIENT PHYSICAL THERAPY TREATMENT   Patient Name: Sarah Barber MRN: [id number] DOB:[birth year], 46 y.o., female Today's Date: 02/16/2022   PT End of Session - 02/16/22 1158     Visit Number 8    Number of Visits 27    Date for PT Re-Evaluation 04/21/22    Authorization Type [employer]    PT Start Time 1102    PT Stop Time 1143    PT Time Calculation (min) 41 min    Activity Tolerance Patient tolerated treatment well;Patient limited by pain    Behavior During Therapy WFL for tasks assessed/performed                Past Medical History:  Diagnosis Date   Allergies    Anxiety    Bipolar disorder (HCC)    Chronic headaches    Depression    Fatigue    Menstrual pain    Panic attacks    PTSD (post-traumatic stress disorder)    Seizure ([place])    as a child - last one at age 59- petite mal   Vision changes    Past Surgical History:  Procedure Laterality Date   CESAREAN SECTION      x 2   HYSTERECTOMY ABDOMINAL WITH SALPINGECTOMY Bilateral 07/28/2019   Procedure: HYSTERECTOMY ABDOMINAL WITH BILATERAL SALPINGECTOMY AND RIGHT OOPHORECTOMY;  Surgeon: [name], MD;  Location: [facility];  Service: Gynecology;  Laterality: Bilateral;   KNEE ARTHROSCOPY Right 01/23/2022   Procedure: RIGHT KNEE ARTHROSCOPY WITH DEBRIDEMENT;  Surgeon: Vanetta Mulders, MD;  Location: [facility];  Service: Orthopedics;  Laterality: Right;   KNEE RECONSTRUCTION Right 01/23/2022   Procedure: RIGHT KNEE PATELLOFEMORAL LIGAMENT RECONSTRUCTION;  Surgeon: Vanetta Mulders, MD;  Location: [facility];  Service: Orthopedics;  Laterality: Right;   Patient Active Problem List   Diagnosis Date Noted   PVNS (pigmented villonodular synovitis)    Patellar instability of right knee    Status post hysterectomy 07/28/2019   Anemia due to acute blood loss 07/23/2019   Fibroid uterus 04/27/2019   Family history of breast cancer in first degree relative 04/27/2019    Abnormal uterine bleeding 04/27/2019    REFERRING PROVIDER: Vanetta Mulders, MD  REFERRING DIAG: M25.361 (ICD-10-CM) - Patellar instability of right knee  THERAPY DIAG:  Acute pain of right knee  Stiffness of right knee, not elsewhere classified  Muscle weakness (generalized)  Difficulty in walking, not elsewhere classified  Rationale for Evaluation and Treatment Rehabilitation  ONSET DATE: 01/23/22  RIGHT KNEE PATELLOFEMORAL LIGAMENT RECONSTRUCTION Right General  RIGHT KNEE ARTHROSCOPY WITH DEBRIDEMENT      SUBJECTIVE:   SUBJECTIVE STATEMENT:  Having a lot of discomfort especially when I'm trying to sleep, walking is getting better. I think one of my sutures is infected, its opened up and there's some yellow drainage coming out. ROM is still hard for me but I've been working on my HEP.   PERTINENT HISTORY: H/o c-section Hx of patellar instability  PAIN:  Are you having pain? Yes: NPRS scale: 4/10 Pain location: Rt knee Pain description: discomfort but sometimes pain like a grinding feeling, sometimes shooting pains  Aggravating factors: bending Relieving factors: ice  PRECAUTIONS: Knee  WEIGHT BEARING RESTRICTIONS Yes as tolerated in brace & crutches  FALLS:  Has patient fallen in last 6 months? No  LIVING ENVIRONMENT: Lives with: lives with their family Lives in: House/apartment  PLOF: Independent  PATIENT GOALS decrease pain   OBJECTIVE:   TODAY'S TREATMENT:  8/11  Knee still swollen- a bit warm but no erythema noted around incision of concern (although it is a bit open at proximal end), unable to get any drainage; advised her to take picture for timestamp and contact Dr Sammuel Hines with concerns especially if she notes increased drainage or redness around edges of her steristrips   Quad sets 1x15 5 second holds  Overpressure stretch into knee extension 3x30 seconds  Knee flexion stretches and PROM in supine and sitting edge of mat table- had to move  very gradually but able to reach 70 degrees passively in sitting (but lacking 15 degrees extension today) Gastroc stretches 3x30 seconds  Hamstring stretches in sitting 3x30 seconds   Supine hip abduction x10 MinA from therapist  Gentle grade I-II mobs of patella lateral, superior/inferior as tolerated- very tight in superior/inferior direction especially    8/9  Therapeutic Exercise: Reviewed response to prior Rx, current function, HEP compliance, and pain levels.   Pt received R knee flexion and extension PROM in supine. Assessed ROM. Pt performed: Longsitting gastroc stretch 3x20 sec PF with GTB 2x10-15 reps Seated knee extension stretch with heel propped on stool x 3 mins Quad set x 10 reps with 5 sec hold Pt performed R knee flexion PROM with hands under thigh x 10 reps  R knee extension AROM:  lacking 21 deg R knee flexion PROM:  55 deg R knee extension AROM:  (after exercises)  lacking 13 deg   Updated Hep and gave pt a HEP handout.  Educated pt in correct form and appropriate frequency.    Gait training:  Educated pt concerning [place] status.  Pt ambulated with brace and with bilat crutches with cuing and instruction for TKE and heel strike.    PATIENT EDUCATION:  Education details: Anatomy of condition, POC, HEP, exercise form/rationale, Gait training, Wb'ing status.  PT educated pt with ROM limits and instructed her to not perform into a painful or tight range.  PT instructed pt to not perform into tension.  Importance of regaining extension.  Person educated: Patient Education method: Explanation, Demonstration, Tactile cues, Verbal cues, and Handouts Education comprehension: verbalized understanding, returned demonstration, verbal cues required, tactile cues required, and needs further education   HOME EXERCISE PROGRAM: [id number]  Updated HEP: - Long Sitting Calf Stretch with Strap  - 2 x daily - 7 x weekly - 2 reps - 20-30 seconds hold - Long Sitting Ankle  Plantar Flexion with Resistance  - 1 x daily - 7 x weekly - 2-3 sets - 10 reps - Seated Passive Knee Extension  stretch - 2-3 x daily - 7 x weekly - 1-2 reps - 2 - 3 minutes hold   ASSESSMENT:  CLINICAL IMPRESSION:  Arlesia arrives doing OK today, still having a lot of pain and has some concern about one of her incisions- we will monitor this closely. Focused on improving knee ROM today as well as on quad activation and hip strengthening as tolerated. Generally very guarded and fearful of knee ROM- "it feels like the same thing will mess up again" even when working inside parameters of protocol.    OBJECTIVE IMPAIRMENTS decreased activity tolerance, difficulty walking, decreased ROM, decreased strength, increased edema, increased muscle spasms, impaired flexibility, improper body mechanics, and pain.   ACTIVITY LIMITATIONS lifting, bending, standing, squatting, stairs, transfers, bed mobility, bathing, dressing, locomotion level, and caring for others  PARTICIPATION LIMITATIONS: meal prep, cleaning, laundry, driving, shopping, and community activity  PERSONAL FACTORS 1 comorbidity: h/o c-section limiting  core stability  are also affecting patient's functional outcome.   REHAB POTENTIAL: Good  CLINICAL DECISION MAKING: Stable/uncomplicated  EVALUATION COMPLEXITY: Low   GOALS: Goals reviewed with patient? Yes  SHORT TERM GOALS: Target date: 02/26/2022  Knee comfortably to 0-130 Baseline:see table Goal status: INITIAL  2.  Able to hold a quad activation for 20s Baseline: unable at eval Goal status: INITIAL   LONG TERM GOALS: Target date: 04/23/2022   Able to navigate stairs step over step pattern without pain Baseline: unable at eval Goal status: INITIAL  2.  LEFS score to improve within 85% of full function Baseline: see obj Goal status: INITIAL  3.  Gross LE strength 90% compared to contralateral LE via hand held dynamometry testing Baseline: unable to test at  eval Goal status: INITIAL    PLAN: PT FREQUENCY: 1-2x/week  PT DURATION: 12 weeks  PLANNED INTERVENTIONS: Therapeutic exercises, Therapeutic activity, Neuromuscular re-education, Balance training, Gait training, Patient/Family education, Self Care, Joint mobilization, Stair training, Aquatic Therapy, Dry Needling, Electrical stimulation, Cryotherapy, Moist heat, Taping, Vasopneumatic device, Ionotophoresis '4mg'$ /ml Dexamethasone, Manual therapy, and Re-evaluation  PLAN FOR NEXT SESSION: continue per Dr. Eddie Dibbles MPFL reconstruction protocol.  [place] e-stim to improve quad activation.  Work on knee ROM.  Pt to return to MD in 3 weeks. Monitor incisions for infection    Kaydon Husby U PT DPT PN2  02/16/2022, 11:59 AM

## 2022-02-20 ENCOUNTER — Ambulatory Visit (HOSPITAL_BASED_OUTPATIENT_CLINIC_OR_DEPARTMENT_OTHER): Payer: Medicaid Other | Admitting: Physical Therapy

## 2022-02-20 ENCOUNTER — Encounter (HOSPITAL_BASED_OUTPATIENT_CLINIC_OR_DEPARTMENT_OTHER): Payer: Self-pay | Admitting: Physical Therapy

## 2022-02-20 DIAGNOSIS — M25661 Stiffness of right knee, not elsewhere classified: Secondary | ICD-10-CM

## 2022-02-20 DIAGNOSIS — M25561 Pain in right knee: Secondary | ICD-10-CM | POA: Diagnosis not present

## 2022-02-20 DIAGNOSIS — M6281 Muscle weakness (generalized): Secondary | ICD-10-CM

## 2022-02-20 DIAGNOSIS — R262 Difficulty in walking, not elsewhere classified: Secondary | ICD-10-CM

## 2022-02-20 NOTE — Therapy (Signed)
OUTPATIENT PHYSICAL THERAPY TREATMENT   Patient Name: Sarah Barber MRN: [id number] DOB:08-12-1975, 46 y.o., female Today's Date: 02/21/2022   PT End of Session - 02/20/22 1336     Visit Number 9    Number of Visits 27    Date for PT Re-Evaluation 04/21/22    Authorization Type MCD healthy blue    PT Start Time 1315   pt arrived late to Rx   PT Stop Time 1349    PT Time Calculation (min) 34 min    Activity Tolerance Patient tolerated treatment well    Behavior During Therapy WFL for tasks assessed/performed                Past Medical History:  Diagnosis Date   Allergies    Anxiety    Bipolar disorder (HCC)    Chronic headaches    Depression    Fatigue    Menstrual pain    Panic attacks    PTSD (post-traumatic stress disorder)    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[place])    as a child - last one at age 69- petite mal   Vision changes    Past Surgical History:  Procedure Laterality Date   CESAREAN SECTION      x 2   HYSTERECTOMY ABDOMINAL WITH SALPINGECTOMY Bilateral 07/28/2019   Procedure: HYSTERECTOMY ABDOMINAL WITH BILATERAL SALPINGECTOMY AND RIGHT OOPHORECTOMY;  Surgeon: [name], MD;  Location: [facility];  Service: Gynecology;  Laterality: Bilateral;   KNEE ARTHROSCOPY Right 01/23/2022   Procedure: RIGHT KNEE ARTHROSCOPY WITH DEBRIDEMENT;  Surgeon: Vanetta Mulders, MD;  Location: [facility];  Service: Orthopedics;  Laterality: Right;   KNEE RECONSTRUCTION Right 01/23/2022   Procedure: RIGHT KNEE PATELLOFEMORAL LIGAMENT RECONSTRUCTION;  Surgeon: Vanetta Mulders, MD;  Location: [facility];  Service: Orthopedics;  Laterality: Right;   Patient Active Problem List   Diagnosis Date Noted   PVNS (pigmented villonodular synovitis)    Patellar instability of right knee    Status post hysterectomy 07/28/2019   Anemia due to acute blood loss 07/23/2019   Fibroid uterus 04/27/2019   Family history of breast cancer in first degree relative 04/27/2019    Abnormal uterine bleeding 04/27/2019    REFERRING PROVIDER: Vanetta Mulders, MD  REFERRING DIAG: M25.361 (ICD-10-CM) - Patellar instability of right knee  THERAPY DIAG:  Acute pain of right knee  Stiffness of right knee, not elsewhere classified  Muscle weakness (generalized)  Difficulty in walking, not elsewhere classified  Rationale for Evaluation and Treatment Rehabilitation  ONSET DATE: 01/23/22  RIGHT KNEE PATELLOFEMORAL LIGAMENT RECONSTRUCTION Right General  RIGHT KNEE ARTHROSCOPY WITH DEBRIDEMENT      SUBJECTIVE:   SUBJECTIVE STATEMENT: Pt is 4 weeks s/p Right knee medial patellofemoral ligament reconstruction with gracilis allograft and limited synovectomy.  Pt saw MD and note indicted to continue to advance according to the MPFL reconstruction protocol and to advance her weightbearing as tolerated.  Pt states MD opened her brace to 60 deg last week.  She has been locking and unlocking her brace.   Pt states her knee is swollen.  She is icing her knee though her swelling is not improving.   Pt states she thinks she may have put too much pressure on R LE when getting into/out of shower a week out of surgery and her knee has had increased swelling since.  Pt has difficulty sleeping and is unable to lie on her sides.  Pt is limited with her normal functional mobility skills including ambulation and  transfers.     Pt states she was very sore after prior Rx.  Pt states she had a lot of pain over the weekend.  Pt reports she used ice, heat, Ibuprofen, and pain meds over the weekend.  Pt states she has certain areas of her knee that gets colder with the ice than others. Pt states she has increased pain down her anterior shin.  She denies pain currently and reports compliance with HEP.  Pt saw MD in the hallway after prior Rx.  He was able to look at knee and pt informed him about swelling and the drainage at the incision.  MD wanted pt to make an appt and she is seeing him this Thursday.    PERTINENT HISTORY: H/o c-section Hx of patellar instability  PAIN:  Are you having pain? Yes: NPRS scale: 0/10 Pain location: Rt knee Pain description: discomfort Aggravating factors: bending Relieving factors: ice  PRECAUTIONS: Knee  WEIGHT BEARING RESTRICTIONS Yes as tolerated in brace & crutches  FALLS:  Has patient fallen in last 6 months? No  LIVING ENVIRONMENT: Lives with: lives with their family Lives in: House/apartment  PLOF: Independent  PATIENT GOALS decrease pain   OBJECTIVE:   TODAY'S TREATMENT:  OBSERVATION:  Pt has steri strips over incisions.  Incisions were dry and had no drainage.  There was a tiny spot of dried blood on medial incision.   Therapeutic Exercise: Reviewed response to prior Rx, current function, HEP compliance, and pain levels.   Pt received R knee flexion PROM seated at EOT. Pt received manual knee extension stretch with heel propped in therapist's hand. Assessed ROM. Pt performed: Longsitting gastroc stretch 3x20 sec Supine SLR in brace locked with mod assistance 2x10 Supine heel slides with strap AAROM x10 reps Quad set x 10 reps with 5 sec hold   R knee extension AROM/PROM:  lacking 15/10 deg R knee flexion PROM/AAROM:  73/72 deg     PATIENT EDUCATION:  Education details: Anatomy of condition, POC, HEP, and exercise form/rationale.  PT educated pt with ROM limits and instructed her to not perform into a painful or tight range.  PT instructed pt to not perform into tension.  Importance of regaining extension.  Person educated: Patient Education method: Explanation, Demonstration, Tactile cues, Verbal cues, and Handouts Education comprehension: verbalized understanding, returned demonstration, verbal cues required, tactile cues required, and needs further education   HOME EXERCISE PROGRAM: [id number]  Updated HEP: - Long Sitting Calf Stretch with Strap  - 2 x daily - 7 x weekly - 2 reps - 20-30 seconds hold - Long Sitting  Ankle Plantar Flexion with Resistance  - 1 x daily - 7 x weekly - 2-3 sets - 10 reps - Seated Passive Knee Extension  stretch - 2-3 x daily - 7 x weekly - 1-2 reps - 2 - 3 minutes hold   ASSESSMENT:  CLINICAL IMPRESSION:  Rx time limited today due pt arriving late.  Pt is limited with knee flexion and extension ROM, especially extension ROM.  Pt was guarded with PROM and required cuing to relax R LE with PROM.  Pt c/o's of R hip pain with manual knee extension stretch and when testing knee extension.  Pt continues to have significant deficits in quad activation.  She requires assistance from PT with supine SLR while in brace.  Pt responded well to Rx having no c/o's after Rx.  pt should benefit from cont skilled PT services per protocol to address impairments and goals and  to assist with restoring desired level of function.    OBJECTIVE IMPAIRMENTS decreased activity tolerance, difficulty walking, decreased ROM, decreased strength, increased edema, increased muscle spasms, impaired flexibility, improper body mechanics, and pain.   ACTIVITY LIMITATIONS lifting, bending, standing, squatting, stairs, transfers, bed mobility, bathing, dressing, locomotion level, and caring for others  PARTICIPATION LIMITATIONS: meal prep, cleaning, laundry, driving, shopping, and community activity  PERSONAL FACTORS 1 comorbidity: h/o c-section limiting core stability  are also affecting patient's functional outcome.   REHAB POTENTIAL: Good  CLINICAL DECISION MAKING: Stable/uncomplicated  EVALUATION COMPLEXITY: Low   GOALS: Goals reviewed with patient? Yes  SHORT TERM GOALS: Target date: 02/26/2022  Knee comfortably to 0-130 Baseline:see table Goal status: INITIAL  2.  Able to hold a quad activation for 20s Baseline: unable at eval Goal status: INITIAL   LONG TERM GOALS: Target date: 04/23/2022   Able to navigate stairs step over step pattern without pain Baseline: unable at eval Goal status:  INITIAL  2.  LEFS score to improve within 85% of full function Baseline: see obj Goal status: INITIAL  3.  Gross LE strength 90% compared to contralateral LE via hand held dynamometry testing Baseline: unable to test at eval Goal status: INITIAL    PLAN: PT FREQUENCY: 1-2x/week  PT DURATION: 12 weeks  PLANNED INTERVENTIONS: Therapeutic exercises, Therapeutic activity, Neuromuscular re-education, Balance training, Gait training, Patient/Family education, Self Care, Joint mobilization, Stair training, Aquatic Therapy, Dry Needling, Electrical stimulation, Cryotherapy, Moist heat, Taping, Vasopneumatic device, Ionotophoresis '4mg'$ /ml Dexamethasone, Manual therapy, and Re-evaluation  PLAN FOR NEXT SESSION: continue per Dr. Eddie Dibbles MPFL reconstruction protocol.  [place] e-stim to improve quad activation.  Work on knee ROM.  Pt to return to MD in 3 weeks.   Selinda Michaels III PT, DPT 02/21/22 6:46 PM

## 2022-02-21 NOTE — Therapy (Signed)
OUTPATIENT PHYSICAL THERAPY TREATMENT      Patient Name: Sarah Barber MRN: [id number] DOB:1975-11-18, 46 y.o., female Today's Date: 02/23/2022   PT End of Session - 02/22/22 1354     Visit Number 10    Number of Visits 27    Date for PT Re-Evaluation 04/21/22    Authorization Type MCD healthy blue    Progress Note Due on Visit 15    PT Start Time 1350    PT Stop Time 1437    PT Time Calculation (min) 47 min    Activity Tolerance Patient tolerated treatment well    Behavior During Therapy WFL for tasks assessed/performed                 Past Medical History:  Diagnosis Date   Allergies    Anxiety    Bipolar disorder (HCC)    Chronic headaches    Depression    Fatigue    Menstrual pain    Panic attacks    PTSD (post-traumatic stress disorder)    Seizure ([place])    as a child - last one at age 71- petite mal   Vision changes    Past Surgical History:  Procedure Laterality Date   CESAREAN SECTION      x 2   HYSTERECTOMY ABDOMINAL WITH SALPINGECTOMY Bilateral 07/28/2019   Procedure: HYSTERECTOMY ABDOMINAL WITH BILATERAL SALPINGECTOMY AND RIGHT OOPHORECTOMY;  Surgeon: [name], MD;  Location: [facility];  Service: Gynecology;  Laterality: Bilateral;   KNEE ARTHROSCOPY Right 01/23/2022   Procedure: RIGHT KNEE ARTHROSCOPY WITH DEBRIDEMENT;  Surgeon: Vanetta Mulders, MD;  Location: [facility];  Service: Orthopedics;  Laterality: Right;   KNEE RECONSTRUCTION Right 01/23/2022   Procedure: RIGHT KNEE PATELLOFEMORAL LIGAMENT RECONSTRUCTION;  Surgeon: Vanetta Mulders, MD;  Location: [facility];  Service: Orthopedics;  Laterality: Right;   Patient Active Problem List   Diagnosis Date Noted   PVNS (pigmented villonodular synovitis)    Patellar instability of right knee    Status post hysterectomy 07/28/2019   Anemia due to acute blood loss 07/23/2019   Fibroid uterus 04/27/2019   Family history of breast cancer in first degree relative  04/27/2019   Abnormal uterine bleeding 04/27/2019    REFERRING PROVIDER: Vanetta Mulders, MD  REFERRING DIAG: M25.361 (ICD-10-CM) - Patellar instability of right knee  THERAPY DIAG:  Acute pain of right knee  Stiffness of right knee, not elsewhere classified  Muscle weakness (generalized)  Difficulty in walking, not elsewhere classified  Rationale for Evaluation and Treatment Rehabilitation  ONSET DATE: 01/23/22  RIGHT KNEE PATELLOFEMORAL LIGAMENT RECONSTRUCTION Right General  RIGHT KNEE ARTHROSCOPY WITH DEBRIDEMENT      SUBJECTIVE:   SUBJECTIVE STATEMENT: Pt is 4 weeks and 2 days s/p Right knee medial patellofemoral ligament reconstruction with gracilis allograft and limited synovectomy. Pt states her knee is swollen.  Pt saw MD this AM to try to remove some fluid.  MD tried to aspirate her knee though didn't remove any fluid.  She states she had significant increased pain.  She thought she was going to pass out during the procedure and still feels dizzy now.  Pt states she felt good after prior Rx.   Pt is limited with her normal functional mobility skills including ambulation and transfers.      PERTINENT HISTORY: H/o c-section Hx of patellar instability  PAIN:  Are you having pain? Yes: NPRS scale: 9/10 Pain location: Rt knee Pain description: discomfort Aggravating factors: bending Relieving factors:  ice  PRECAUTIONS: Knee  WEIGHT BEARING RESTRICTIONS Yes as tolerated in brace & crutches  FALLS:  Has patient fallen in last 6 months? No  LIVING ENVIRONMENT: Lives with: lives with their family Lives in: House/apartment  PLOF: Independent  PATIENT GOALS decrease pain   OBJECTIVE:   TODAY'S TREATMENT:  OBSERVATION:  Pt has steri strips over incisions.  Incisions were dry and had no drainage.  There was a tiny spot of dried blood on medial incision.  Pt has a large band aid on anterolateral portion of knee which is from seeing MD earlier.  Therapeutic  Exercise: Reviewed response to prior Rx, current function, HEP compliance, and pain levels.   Pt received R knee flexion PROM seated at EOT and in supine. Pt received manual knee extension stretch with heel propped in therapist's hand. Assessed ROM. Pt performed: Quad sets with heel prop with 5 sec hold x 15 reps Supine SLR in brace locked with mod assistance 2x10 Supine heel slides with strap AAROM x15 reps Supine heel slides x 10 reps    R knee extension AROM/PROM:  lacking 15/10 deg R knee extension AROM:  8 deg after e-stim R knee flexion PROM/AAROM:  67/70 deg    Pt received [place] e-stim 10 sec on/10 sec off with active quad set facilitate improved quad activation and strength.  10 mins     PATIENT EDUCATION:  Education details: Anatomy of condition, POC, HEP, and exercise form/rationale.  PT educated pt with ROM limits and instructed her to not perform into a painful or tight range.  PT instructed pt to not perform into tension.  Importance of regaining extension.  Person educated: Patient Education method: Explanation, Demonstration, Tactile cues, Verbal cues, and Handouts Education comprehension: verbalized understanding, returned demonstration, verbal cues required, tactile cues required, and needs further education   HOME EXERCISE PROGRAM: [id number]  Updated HEP: - Long Sitting Calf Stretch with Strap  - 2 x daily - 7 x weekly - 2 reps - 20-30 seconds hold - Long Sitting Ankle Plantar Flexion with Resistance  - 1 x daily - 7 x weekly - 2-3 sets - 10 reps - Seated Passive Knee Extension  stretch - 2-3 x daily - 7 x weekly - 1-2 reps - 2 - 3 minutes hold   ASSESSMENT:  CLINICAL IMPRESSION:  Pt presents to Rx reporting increased pain and has had increased pain since having knee aspirated earlier today.  Pt continues to be limited with knee flexion and extension ROM.  Pt has pain with knee flexion and extension PROM and is guarding with ROM.  She had worse knee flexion  ROM today.  Pt continues to have significant deficits in quad activation.  She requires assistance from PT with supine SLR while in brace.  Pt demonstrates improved quad contraction and improved knee extension with [place] e-stim.  Pt should benefit from cont skilled PT services per protocol to address impairments and goals and to assist with restoring desired level of function.       OBJECTIVE IMPAIRMENTS decreased activity tolerance, difficulty walking, decreased ROM, decreased strength, increased edema, increased muscle spasms, impaired flexibility, improper body mechanics, and pain.   ACTIVITY LIMITATIONS lifting, bending, standing, squatting, stairs, transfers, bed mobility, bathing, dressing, locomotion level, and caring for others  PARTICIPATION LIMITATIONS: meal prep, cleaning, laundry, driving, shopping, and community activity  PERSONAL FACTORS 1 comorbidity: h/o c-section limiting core stability  are also affecting patient's functional outcome.   REHAB POTENTIAL: Good  CLINICAL DECISION MAKING:  Stable/uncomplicated  EVALUATION COMPLEXITY: Low   GOALS: Goals reviewed with patient? Yes  SHORT TERM GOALS: Target date: 02/26/2022  Knee comfortably to 0-130 Baseline:see table Goal status: INITIAL  2.  Able to hold a quad activation for 20s Baseline: unable at eval Goal status: INITIAL   LONG TERM GOALS: Target date: 04/23/2022   Able to navigate stairs step over step pattern without pain Baseline: unable at eval Goal status: INITIAL  2.  LEFS score to improve within 85% of full function Baseline: see obj Goal status: INITIAL  3.  Gross LE strength 90% compared to contralateral LE via hand held dynamometry testing Baseline: unable to test at eval Goal status: INITIAL    PLAN: PT FREQUENCY: 1-2x/week  PT DURATION: 12 weeks  PLANNED INTERVENTIONS: Therapeutic exercises, Therapeutic activity, Neuromuscular re-education, Balance training, Gait training,  Patient/Family education, Self Care, Joint mobilization, Stair training, Aquatic Therapy, Dry Needling, Electrical stimulation, Cryotherapy, Moist heat, Taping, Vasopneumatic device, Ionotophoresis '4mg'$ /ml Dexamethasone, Manual therapy, and Re-evaluation  PLAN FOR NEXT SESSION: continue per Dr. Eddie Dibbles MPFL reconstruction protocol.  [place] e-stim to improve quad activation.  Work on knee ROM.  Pt to return to MD in 3 weeks.   Selinda Michaels III PT, DPT 02/23/22 1:18 PM

## 2022-02-22 ENCOUNTER — Ambulatory Visit (HOSPITAL_BASED_OUTPATIENT_CLINIC_OR_DEPARTMENT_OTHER): Payer: Medicaid Other | Admitting: Physical Therapy

## 2022-02-22 ENCOUNTER — Encounter (HOSPITAL_BASED_OUTPATIENT_CLINIC_OR_DEPARTMENT_OTHER): Payer: Self-pay | Admitting: Physical Therapy

## 2022-02-22 ENCOUNTER — Telehealth: Payer: Self-pay | Admitting: Orthopaedic Surgery

## 2022-02-22 ENCOUNTER — Ambulatory Visit (INDEPENDENT_AMBULATORY_CARE_PROVIDER_SITE_OTHER): Payer: Medicaid Other | Admitting: Orthopaedic Surgery

## 2022-02-22 DIAGNOSIS — M25561 Pain in right knee: Secondary | ICD-10-CM

## 2022-02-22 DIAGNOSIS — M25661 Stiffness of right knee, not elsewhere classified: Secondary | ICD-10-CM

## 2022-02-22 DIAGNOSIS — R262 Difficulty in walking, not elsewhere classified: Secondary | ICD-10-CM

## 2022-02-22 DIAGNOSIS — M25361 Other instability, right knee: Secondary | ICD-10-CM

## 2022-02-22 DIAGNOSIS — M6281 Muscle weakness (generalized): Secondary | ICD-10-CM

## 2022-02-22 NOTE — Progress Notes (Signed)
Post Operative Evaluation    Procedure/Date of Surgery: Right knee MPFL reconstruction 7/18  Interval History:    Presents today for early follow-up visit as she is persistently having swelling in the right knee which is making it hard for her to achieve range of motion.  I have asked her to follow-up with me so I can aspirate potentially hemarthrosis of the knee to allow her to perform earlier mobilization.   PMH/PSH/Family History/Social History/Meds/Allergies:    Past Medical History:  Diagnosis Date   Allergies    Anxiety    Bipolar disorder (HCC)    Chronic headaches    Depression    Fatigue    Menstrual pain    Panic attacks    PTSD (post-traumatic stress disorder)    Seizure ([place])    as a child - last one at age 28- petite mal   Vision changes    Past Surgical History:  Procedure Laterality Date   CESAREAN SECTION      x 2   HYSTERECTOMY ABDOMINAL WITH SALPINGECTOMY Bilateral 07/28/2019   Procedure: HYSTERECTOMY ABDOMINAL WITH BILATERAL SALPINGECTOMY AND RIGHT OOPHORECTOMY;  Surgeon: [name], MD;  Location: [facility];  Service: Gynecology;  Laterality: Bilateral;   KNEE ARTHROSCOPY Right 01/23/2022   Procedure: RIGHT KNEE ARTHROSCOPY WITH DEBRIDEMENT;  Surgeon: Vanetta Mulders, MD;  Location: [facility];  Service: Orthopedics;  Laterality: Right;   KNEE RECONSTRUCTION Right 01/23/2022   Procedure: RIGHT KNEE PATELLOFEMORAL LIGAMENT RECONSTRUCTION;  Surgeon: Vanetta Mulders, MD;  Location: [facility];  Service: Orthopedics;  Laterality: Right;   Social History   Socioeconomic History   Marital status: Single    Spouse name: Not on file   Number of children: Not on file   Years of education: Not on file   Highest education level: Not on file  Occupational History   Not on file  Tobacco Use   Smoking status: Former    Types: Cigarettes    Quit date: 09/2015    Years since quitting: 6.4    Smokeless tobacco: Never   Tobacco comments:    smoked off and on  Vaping Use   Vaping Use: Never used  Substance and Sexual Activity   Alcohol use: Yes    Comment: occasional   Drug use: Never   Sexual activity: Not on file  Other Topics Concern   Not on file  Social History Narrative   Right handed    Caffeine- rare    Social Determinants of Health   Financial Resource Strain: Not on file  Food Insecurity: Not on file  Transportation Needs: Not on file  Physical Activity: Not on file  Stress: Not on file  Social Connections: Not on file   Family History  Problem Relation Age of Onset   Diabetes Mother    Cerebrovascular Accident Father        x2   Diabetes Maternal Grandmother    Kidney disease Maternal Grandmother    Lung cancer Maternal Grandfather    Breast cancer Neg Hx    Allergies  Allergen Reactions   No Known Allergies Other (See Comments)   Pineapple Nausea And Vomiting   Other Rash    pineapple,cabbage   Current Outpatient Medications  Medication Sig Dispense Refill   aspirin EC 325 MG tablet  Take 1 tablet (325 mg total) by mouth daily. 30 tablet 0   butalbital-acetaminophen-caffeine (FIORICET) 50-325-40 MG tablet Take by mouth 2 (two) times daily as needed for headache.     lidocaine (LIDODERM) 5 % Place 1 patch onto the skin daily. Remove & Discard patch within 12 hours or as directed by MD 30 patch 0   oxycodone (OXY-IR) 5 MG capsule Take 1 capsule (5 mg total) by mouth every 4 (four) hours as needed (severe pain). 20 capsule 0   oxyCODONE (ROXICODONE) 5 MG immediate release tablet Take 1 tablet (5 mg total) by mouth every 4 (four) hours as needed for severe pain. 30 tablet 0   topiramate (TOPAMAX) 25 MG tablet Take 50 mg at bedtime for one week, then increase to 75 mg at bedtime for one week, then increase to 100 mg at bedtime 120 tablet 3   zolmitriptan (ZOMIG) 5 MG tablet Take 1 tablet (5 mg total) by mouth as needed for migraine. 10 tablet 3    Current Facility-Administered Medications  Medication Dose Route Frequency Provider Last Rate Last Admin   oxyCODONE (Oxy IR/ROXICODONE) immediate release tablet 5 mg  5 mg Oral Q4H PRN Vanetta Mulders, MD       No results found.  Review of Systems:   A ROS was performed including pertinent positives and negatives as documented in the HPI.   Musculoskeletal Exam:     Right knee incisions are well-appearing.  No erythema or drainage.  There is fullness with a small effusion in the knee.  Range of motion is from 2 degrees to approximately 100 degrees.  Brace is in place.  Negative apprehension with lateral patellar force..  Distal neurosensory exam is intact  Imaging:    None  I personally reviewed and interpreted the radiographs.   Assessment:   46 year old female who is 4 weeks status post MPFL reconstruction.  At today's visit I did appreciate an effusion in the knee.  After verbal consent was attained I did make an attempt to aspirate the right knee.  There is no fluid that could be aspirated.  At this time I would like her to continue to work on aggressive physical therapy for range of motion about the knee.  Plan :    -Return to clinic in 2 weeks for assessment      I personally saw and evaluated the patient, and participated in the management and treatment plan.  Vanetta Mulders, MD Attending Physician, Orthopedic Surgery  This document was dictated using Dragon voice recognition software. A reasonable attempt at proof reading has been made to minimize errors.

## 2022-02-22 NOTE — Telephone Encounter (Signed)
[place] forms received. To [place]. ?

## 2022-02-23 ENCOUNTER — Telehealth: Payer: Self-pay | Admitting: Orthopaedic Surgery

## 2022-02-23 NOTE — Telephone Encounter (Signed)
Pt called requesting  a call back about a physician statement that was sent to her employer for return to work need to be changed. Please call pt about this matter at [phone].

## 2022-02-26 ENCOUNTER — Telehealth: Payer: Self-pay | Admitting: Orthopaedic Surgery

## 2022-02-26 NOTE — Telephone Encounter (Signed)
Attempted CB x2

## 2022-02-26 NOTE — Telephone Encounter (Signed)
Patient called advised her S/T disability paperwork is in correct. Patient said she was written out from 12/13/2021 to 04/17/2022. The second paperwork is incorrect showing she was to be out from 12/13/2021 to 03/07/19/23. Patient said the paperwork is incorrect and need to show the first date.  The number to contact patient is [phone]

## 2022-02-26 NOTE — Telephone Encounter (Signed)
Attempted to CB, VM full

## 2022-02-27 ENCOUNTER — Ambulatory Visit (HOSPITAL_BASED_OUTPATIENT_CLINIC_OR_DEPARTMENT_OTHER): Payer: Medicaid Other | Admitting: Physical Therapy

## 2022-03-01 NOTE — Therapy (Signed)
OUTPATIENT PHYSICAL THERAPY TREATMENT      Patient Name: Sarah Barber MRN: [id number] DOB:07/29/1975, 46 y.o., female Today's Date: 03/02/2022   PT End of Session - 03/02/22 1323     Visit Number 11    Number of Visits 27    Date for PT Re-Evaluation 04/21/22    Authorization Type [employer]    Progress Note Due on Visit 15    PT Start Time 1318    PT Stop Time 1408    PT Time Calculation (min) 50 min    Activity Tolerance Patient tolerated treatment well    Behavior During Therapy WFL for tasks assessed/performed                  Past Medical History:  Diagnosis Date   Allergies    Anxiety    Bipolar disorder (HCC)    Chronic headaches    Depression    Fatigue    Menstrual pain    Panic attacks    PTSD (post-traumatic stress disorder)    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 36- petite mal   Vision changes    Past Surgical History:  Procedure Laterality Date   CESAREAN SECTION      x 2   HYSTERECTOMY ABDOMINAL WITH SALPINGECTOMY Bilateral 07/28/2019   Procedure: HYSTERECTOMY ABDOMINAL WITH BILATERAL SALPINGECTOMY AND RIGHT OOPHORECTOMY;  Surgeon: [name], MD;  Location: [facility];  Service: Gynecology;  Laterality: Bilateral;   KNEE ARTHROSCOPY Right 01/23/2022   Procedure: RIGHT KNEE ARTHROSCOPY WITH DEBRIDEMENT;  Surgeon: Vanetta Mulders, MD;  Location: [facility];  Service: Orthopedics;  Laterality: Right;   KNEE RECONSTRUCTION Right 01/23/2022   Procedure: RIGHT KNEE PATELLOFEMORAL LIGAMENT RECONSTRUCTION;  Surgeon: Vanetta Mulders, MD;  Location: [facility];  Service: Orthopedics;  Laterality: Right;   Patient Active Problem List   Diagnosis Date Noted   PVNS (pigmented villonodular synovitis)    Patellar instability of right knee    Status post hysterectomy 07/28/2019   Anemia due to acute blood loss 07/23/2019   Fibroid uterus 04/27/2019   Family history of breast cancer in first degree relative  04/27/2019   Abnormal uterine bleeding 04/27/2019    REFERRING PROVIDER: Vanetta Mulders, MD  REFERRING DIAG: M25.361 (ICD-10-CM) - Patellar instability of right knee  THERAPY DIAG:  Acute pain of right knee  Stiffness of right knee, not elsewhere classified  Muscle weakness (generalized)  Difficulty in walking, not elsewhere classified  Rationale for Evaluation and Treatment Rehabilitation  ONSET DATE: 01/23/22  RIGHT KNEE PATELLOFEMORAL LIGAMENT RECONSTRUCTION Right General  RIGHT KNEE ARTHROSCOPY WITH DEBRIDEMENT      SUBJECTIVE:   SUBJECTIVE STATEMENT: Pt is 5 weeks and 3 days s/p Right knee medial patellofemoral ligament reconstruction with gracilis allograft and limited synovectomy. Pt states she had to cancel her prior PT appt due to increased pain because of granddaughter hitting her leg with a tablet.  Pt states she also hit her leg with a tablet last night.  Pt states she walked increased distance going to open house and had increased pain.  Pt reports compliance with HEP.  Pt denies any increased pain after Rx.  Pt states her swelling has improving and she is able to straighten knee better.  Pt is limited with her functional mobility skills including ambulation and transfers.  She is limited with lifting leg.   PERTINENT HISTORY: H/o c-section Hx of patellar instability  PAIN:  Are  you having pain? Yes: NPRS scale: 7/10 Pain location: Rt knee Pain description: discomfort Aggravating factors: bending Relieving factors: ice  PRECAUTIONS: Knee  WEIGHT BEARING RESTRICTIONS Yes as tolerated in brace & crutches  FALLS:  Has patient fallen in last 6 months? No  LIVING ENVIRONMENT: Lives with: lives with their family Lives in: House/apartment  PLOF: Independent  PATIENT GOALS decrease pain   OBJECTIVE:   TODAY'S TREATMENT:  Therapeutic Exercise: Reviewed response to prior Rx, current function, HEP compliance, and pain levels.   Pt received R knee  flexion and extension PROM in supine. Pt received manual knee extension stretch with heel propped in therapist's hand. Assessed ROM. Pt performed: Supine SLR in brace locked with mod assistance x10 Supine heel slides with strap AAROM x15 reps Seated knee flexion stretch with contralateral LE with 5 sec hold Longsitting gastroc stretch with strap 3x30 sec Standing R SLR flexion in brace locked 2x10  See below for pt education  At beginning of Rx: R knee extension AROM/PROM:  lacking 17/11 deg R knee flexion PROM/AAROM:  71/67 deg  After exercises and stretching: R knee extension AROM:  13 deg  R knee flexion AAROM:  80 deg   E-stim: Pt received [place] e-stim 10 sec on/10 sec off with active quad set facilitate improved quad activation and strength.  10 mins     PATIENT EDUCATION:  Education details:  Educated pt concerning her deficits with ROM and quad strength.  Educated pt in appropriate ROM per protocol and instructed pt on importance of regaining motion.  Instructed pt to cont with HEP and focus on ROM.  Pt to add standing R SLR flexion in standing with brace locked.  Pt instructed in exercise form and HEP.   Person educated: Patient Education method: Explanation, Demonstration, Tactile cues, Verbal cues, and Handouts Education comprehension: verbalized understanding, returned demonstration, verbal cues required, tactile cues required, and needs further education   HOME EXERCISE PROGRAM: [id number]  Updated HEP: - Long Sitting Calf Stretch with Strap  - 2 x daily - 7 x weekly - 2 reps - 20-30 seconds hold - Long Sitting Ankle Plantar Flexion with Resistance  - 1 x daily - 7 x weekly - 2-3 sets - 10 reps - Seated Passive Knee Extension  stretch - 2-3 x daily - 7 x weekly - 1-2 reps - 2 - 3 minutes hold   ASSESSMENT:  CLINICAL IMPRESSION: Pt continues to be very limited with knee ROM, strength, and mobility.  She presents to Rx with slightly better flexion AAROM though  worse extension and worse flexion PROM.  She is tight and limited with ROM being behind in protocol with flexion and extension ROM.  Pt is very guarded with knee PROM and requires much cuing to relax R LE with PROM.  PT instructed in home exercises to improve ROM.  Pt demonstrates improved flexion AAROM and extension ROM after exercises and stretching.  Pt is fearful of bending her knee.  It took pt some time to relax while sitting on the edge of the table with R LE hanging off to = her weight on her hips.  She continued to lean toward L hip and required cuing to even weight on her hips.  Pt does not move her R LE much on the table or when performing bed mobility.  She uses her hands to move her R LE.  She has significant weakness in R LE including her quad.  Pt requires assistance from PT to perform  supine SLR.  PT had pt perform in standing with brace locked and she was able to perform SLR better.  Pt agreed to perform at home.  PT used [place] e-stim to improve quad activation.  Pt reports increased pain from 7/10 before Rx to 8/10 after Rx.  Pt should benefit from cont skilled PT services per protocol to address impairments and goals and to assist with restoring desired level of function.   OBJECTIVE IMPAIRMENTS decreased activity tolerance, difficulty walking, decreased ROM, decreased strength, increased edema, increased muscle spasms, impaired flexibility, improper body mechanics, and pain.   ACTIVITY LIMITATIONS lifting, bending, standing, squatting, stairs, transfers, bed mobility, bathing, dressing, locomotion level, and caring for others  PARTICIPATION LIMITATIONS: meal prep, cleaning, laundry, driving, shopping, and community activity  PERSONAL FACTORS 1 comorbidity: h/o c-section limiting core stability  are also affecting patient's functional outcome.   REHAB POTENTIAL: Good  CLINICAL DECISION MAKING: Stable/uncomplicated  EVALUATION COMPLEXITY: Low   GOALS: Goals reviewed with  patient? Yes  SHORT TERM GOALS: Target date: 02/26/2022  Knee comfortably to 0-130 Baseline:see table Goal status: INITIAL  2.  Able to hold a quad activation for 20s Baseline: unable at eval Goal status: INITIAL   LONG TERM GOALS: Target date: 04/23/2022   Able to navigate stairs step over step pattern without pain Baseline: unable at eval Goal status: INITIAL  2.  LEFS score to improve within 85% of full function Baseline: see obj Goal status: INITIAL  3.  Gross LE strength 90% compared to contralateral LE via hand held dynamometry testing Baseline: unable to test at eval Goal status: INITIAL    PLAN: PT FREQUENCY: 1-2x/week  PT DURATION: 12 weeks  PLANNED INTERVENTIONS: Therapeutic exercises, Therapeutic activity, Neuromuscular re-education, Balance training, Gait training, Patient/Family education, Self Care, Joint mobilization, Stair training, Aquatic Therapy, Dry Needling, Electrical stimulation, Cryotherapy, Moist heat, Taping, Vasopneumatic device, Ionotophoresis '4mg'$ /ml Dexamethasone, Manual therapy, and Re-evaluation  PLAN FOR NEXT SESSION: continue per Dr. Eddie Dibbles MPFL reconstruction protocol.  [place] e-stim to improve quad activation.  Work on knee ROM.     Selinda Michaels III PT, DPT 03/02/22 3:17 PM

## 2022-03-02 ENCOUNTER — Ambulatory Visit (HOSPITAL_BASED_OUTPATIENT_CLINIC_OR_DEPARTMENT_OTHER): Payer: Medicaid Other | Admitting: Physical Therapy

## 2022-03-02 ENCOUNTER — Encounter (HOSPITAL_BASED_OUTPATIENT_CLINIC_OR_DEPARTMENT_OTHER): Payer: Self-pay | Admitting: Physical Therapy

## 2022-03-02 DIAGNOSIS — M25561 Pain in right knee: Secondary | ICD-10-CM | POA: Diagnosis not present

## 2022-03-02 DIAGNOSIS — M6281 Muscle weakness (generalized): Secondary | ICD-10-CM

## 2022-03-02 DIAGNOSIS — M25661 Stiffness of right knee, not elsewhere classified: Secondary | ICD-10-CM

## 2022-03-02 DIAGNOSIS — R262 Difficulty in walking, not elsewhere classified: Secondary | ICD-10-CM

## 2022-03-06 ENCOUNTER — Encounter (HOSPITAL_BASED_OUTPATIENT_CLINIC_OR_DEPARTMENT_OTHER): Payer: Self-pay | Admitting: Physical Therapy

## 2022-03-06 ENCOUNTER — Ambulatory Visit (HOSPITAL_BASED_OUTPATIENT_CLINIC_OR_DEPARTMENT_OTHER): Payer: Medicaid Other | Admitting: Physical Therapy

## 2022-03-06 DIAGNOSIS — R262 Difficulty in walking, not elsewhere classified: Secondary | ICD-10-CM

## 2022-03-06 DIAGNOSIS — M25561 Pain in right knee: Secondary | ICD-10-CM

## 2022-03-06 DIAGNOSIS — M25661 Stiffness of right knee, not elsewhere classified: Secondary | ICD-10-CM

## 2022-03-06 DIAGNOSIS — M6281 Muscle weakness (generalized): Secondary | ICD-10-CM

## 2022-03-06 NOTE — Therapy (Signed)
OUTPATIENT PHYSICAL THERAPY TREATMENT      Patient Name: Sarah Barber MRN: [id number] DOB:Jul 01, 1976, 46 y.o., female Today's Date: 03/07/2022   PT End of Session - 03/06/22 1324     Visit Number 12    Number of Visits 27    Date for PT Re-Evaluation 04/21/22    Authorization Type [employer]    PT Start Time 1301    PT Stop Time 1350    PT Time Calculation (min) 49 min    Activity Tolerance Patient tolerated treatment well    Behavior During Therapy WFL for tasks assessed/performed              Past Medical History:  Diagnosis Date   Allergies    Anxiety    Bipolar disorder (HCC)    Chronic headaches    Depression    Fatigue    Menstrual pain    Panic attacks    PTSD (post-traumatic stress disorder)    Seizure ([place])    as a child - last one at age 21- petite mal   Vision changes    Past Surgical History:  Procedure Laterality Date   CESAREAN SECTION      x 2   HYSTERECTOMY ABDOMINAL WITH SALPINGECTOMY Bilateral 07/28/2019   Procedure: HYSTERECTOMY ABDOMINAL WITH BILATERAL SALPINGECTOMY AND RIGHT OOPHORECTOMY;  Surgeon: [name], MD;  Location: [facility];  Service: Gynecology;  Laterality: Bilateral;   KNEE ARTHROSCOPY Right 01/23/2022   Procedure: RIGHT KNEE ARTHROSCOPY WITH DEBRIDEMENT;  Surgeon: Vanetta Mulders, MD;  Location: [facility];  Service: Orthopedics;  Laterality: Right;   KNEE RECONSTRUCTION Right 01/23/2022   Procedure: RIGHT KNEE PATELLOFEMORAL LIGAMENT RECONSTRUCTION;  Surgeon: Vanetta Mulders, MD;  Location: [facility];  Service: Orthopedics;  Laterality: Right;   Patient Active Problem List   Diagnosis Date Noted   PVNS (pigmented villonodular synovitis)    Patellar instability of right knee    Status post hysterectomy 07/28/2019   Anemia due to acute blood loss 07/23/2019   Fibroid uterus 04/27/2019   Family history of breast cancer in first degree relative 04/27/2019   Abnormal uterine bleeding  04/27/2019    REFERRING PROVIDER: Vanetta Mulders, MD  REFERRING DIAG: M25.361 (ICD-10-CM) - Patellar instability of right knee  THERAPY DIAG:  Acute pain of right knee  Stiffness of right knee, not elsewhere classified  Muscle weakness (generalized)  Difficulty in walking, not elsewhere classified  Rationale for Evaluation and Treatment Rehabilitation  ONSET DATE: 01/23/22  RIGHT KNEE PATELLOFEMORAL LIGAMENT RECONSTRUCTION Right General  RIGHT KNEE ARTHROSCOPY WITH DEBRIDEMENT      SUBJECTIVE:   SUBJECTIVE STATEMENT: Pt is 6 weeks s/p Right knee medial patellofemoral ligament reconstruction with gracilis allograft and limited synovectomy.  Pt reports having increased pain and soreness after her prior Rx.  She also went to a restaurant after prior Rx and had increased pain after walking in/out of restaurant.  Pt performed increased walking in the mall on Sunday and had significant increased pain.  Pt reports improved bending with her knee.  She was able to swing her leg back in standing while in brace when her dog came running at her.  Pt states the brace slid down on her leg and peeled the steri strip back causing some bleeding.  She states she is hurting.  Pt states the nerves in her leg bothers her.   PERTINENT HISTORY: H/o c-section Hx of patellar instability  PAIN:  Are you having pain? Yes: NPRS scale:  9/10 Pain location: Rt knee Pain description: discomfort Aggravating factors: bending Relieving factors: ice  PRECAUTIONS: Knee  WEIGHT BEARING RESTRICTIONS Yes as tolerated in brace & crutches  FALLS:  Has patient fallen in last 6 months? No  LIVING ENVIRONMENT: Lives with: lives with their family Lives in: House/apartment  PLOF: Independent  PATIENT GOALS decrease pain   OBJECTIVE:   TODAY'S TREATMENT:  Therapeutic Exercise: Reviewed response to prior Rx, current function, HEP compliance, and pain levels.   Pt received R knee flexion and extension PROM  in supine. Pt received manual knee extension stretch with heel propped in therapist's hand. Assessed ROM. Pt performed: Supine heel slides with strap AAROM x15 reps Seated knee flexion stretch with contralateral LE 5 reps with 5 sec hold Longsitting gastroc stretch with strap 3x30 sec Standing R SLR flexion in brace locked 2x10 Supine hip abduction 2x10 reps Quad set with heel prop x 10 reps with 5 sec hold   Pt unable to perform S/L hip abduction.  See below for pt education  R knee flexion AAROM:  85 deg  Updated HEP and gave pt a HEP handout.  Educated pt in correct form and appropriate frequency.       Gait Training:  pt ambulated with 1 crutch with brace locked at 0-60 deg.  She was hesitant with gait with decreased Wb'ing thru R LE and significantly lacked flexion and extension ROM.  PT instructed pt in improving extension and flexion ROM with gait, heel to toe gait, and increasing Wb'ing thru R LE.  PT demonstrates heel to toe gait pattern.    PATIENT EDUCATION:  Education details:  Educated pt concerning her deficits with ROM and quad strength.  Educated pt in appropriate ROM per protocol and instructed pt on importance of regaining motion.  Instructed pt to cont with HEP and focus on ROM.  Updated HEP and gave pt a handout. Person educated: Patient Education method: Explanation, Demonstration, Tactile cues, Verbal cues, and Handouts Education comprehension: verbalized understanding, returned demonstration, verbal cues required, tactile cues required, and needs further education   HOME EXERCISE PROGRAM: [id number]  Updated HEP: - Standing Hip Flexion AROM  - 1 x daily - 7 x weekly - 2 sets - 10 reps in brace locked - Seated Knee Flexion AAROM  - 2 x daily - 7 x weekly - 5 reps - 5-10 seconds hold   ASSESSMENT:  CLINICAL IMPRESSION: Pt presents to Rx reporting improved flexion ROM.  Pt has increased pain with community ambulation.  PT educated pt with heel to toe gait  training and encouraged increased [place] thru R LE.  Overall pt is improving with increased Wb'ing thru R LE with gait though demonstrated much improved quality of gait with cuing and instruction today.  Pt continues to be very limited with knee ROM, strength, and mobility.  Pt continues to be guarded with knee flexion ROM though did have better tolerance today.  She demonstrated improved flexion AAROM today.  She continues to be very limited with extension.  Pt has significant strength deficits in R knee and is limited with R LE mobility.  She was unable to perform S/L hip abduction.  PT updated HEP to improve quad strength and knee ROM.  Pt responded well to Rx and should benefit from cont skilled PT services per protocol to address impairments and goals and to assist with restoring desired level of function.   OBJECTIVE IMPAIRMENTS decreased activity tolerance, difficulty walking, decreased ROM, decreased strength, increased edema,  increased muscle spasms, impaired flexibility, improper body mechanics, and pain.   ACTIVITY LIMITATIONS lifting, bending, standing, squatting, stairs, transfers, bed mobility, bathing, dressing, locomotion level, and caring for others  PARTICIPATION LIMITATIONS: meal prep, cleaning, laundry, driving, shopping, and community activity  PERSONAL FACTORS 1 comorbidity: h/o c-section limiting core stability  are also affecting patient's functional outcome.   REHAB POTENTIAL: Good  CLINICAL DECISION MAKING: Stable/uncomplicated  EVALUATION COMPLEXITY: Low   GOALS: Goals reviewed with patient? Yes  SHORT TERM GOALS: Target date: 02/26/2022  Knee comfortably to 0-130 Baseline:see table Goal status: INITIAL  2.  Able to hold a quad activation for 20s Baseline: unable at eval Goal status: INITIAL   LONG TERM GOALS: Target date: 04/23/2022   Able to navigate stairs step over step pattern without pain Baseline: unable at eval Goal status: INITIAL  2.  LEFS  score to improve within 85% of full function Baseline: see obj Goal status: INITIAL  3.  Gross LE strength 90% compared to contralateral LE via hand held dynamometry testing Baseline: unable to test at eval Goal status: INITIAL    PLAN: PT FREQUENCY: 1-2x/week  PT DURATION: 12 weeks  PLANNED INTERVENTIONS: Therapeutic exercises, Therapeutic activity, Neuromuscular re-education, Balance training, Gait training, Patient/Family education, Self Care, Joint mobilization, Stair training, Aquatic Therapy, Dry Needling, Electrical stimulation, Cryotherapy, Moist heat, Taping, Vasopneumatic device, Ionotophoresis '4mg'$ /ml Dexamethasone, Manual therapy, and Re-evaluation  PLAN FOR NEXT SESSION: continue per Dr. Eddie Dibbles MPFL reconstruction protocol.  [place] e-stim to improve quad activation.  Work on knee ROM.     Selinda Michaels III PT, DPT 03/07/22 9:38 PM

## 2022-03-09 ENCOUNTER — Encounter (HOSPITAL_BASED_OUTPATIENT_CLINIC_OR_DEPARTMENT_OTHER): Payer: Self-pay | Admitting: Physical Therapy

## 2022-03-09 ENCOUNTER — Ambulatory Visit (INDEPENDENT_AMBULATORY_CARE_PROVIDER_SITE_OTHER): Payer: Medicaid Other | Admitting: Orthopaedic Surgery

## 2022-03-09 ENCOUNTER — Ambulatory Visit (HOSPITAL_BASED_OUTPATIENT_CLINIC_OR_DEPARTMENT_OTHER): Payer: Medicaid Other | Attending: Orthopaedic Surgery | Admitting: Physical Therapy

## 2022-03-09 DIAGNOSIS — M25561 Pain in right knee: Secondary | ICD-10-CM

## 2022-03-09 DIAGNOSIS — M25661 Stiffness of right knee, not elsewhere classified: Secondary | ICD-10-CM | POA: Diagnosis present

## 2022-03-09 DIAGNOSIS — M25361 Other instability, right knee: Secondary | ICD-10-CM

## 2022-03-09 DIAGNOSIS — M6281 Muscle weakness (generalized): Secondary | ICD-10-CM | POA: Diagnosis present

## 2022-03-09 DIAGNOSIS — R262 Difficulty in walking, not elsewhere classified: Secondary | ICD-10-CM

## 2022-03-09 NOTE — Therapy (Signed)
OUTPATIENT PHYSICAL THERAPY TREATMENT      Patient Name: Sarah Barber MRN: [id number] DOB:Oct 10, 1975, 46 y.o., female Today's Date: 03/09/2022   PT End of Session - 03/09/22 1243     Visit Number 13    Number of Visits 27    Date for PT Re-Evaluation 04/21/22    Authorization Type [employer] blue    PT Start Time 1236    PT Stop Time 1330    PT Time Calculation (min) 54 min    Activity Tolerance Patient tolerated treatment well    Behavior During Therapy WFL for tasks assessed/performed               Past Medical History:  Diagnosis Date   Allergies    Anxiety    Bipolar disorder (HCC)    Chronic headaches    Depression    Fatigue    Menstrual pain    Panic attacks    PTSD (post-traumatic stress disorder)    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   Vision changes    Past Surgical History:  Procedure Laterality Date   CESAREAN SECTION      x 2   HYSTERECTOMY ABDOMINAL WITH SALPINGECTOMY Bilateral 07/28/2019   Procedure: HYSTERECTOMY ABDOMINAL WITH BILATERAL SALPINGECTOMY AND RIGHT OOPHORECTOMY;  Surgeon: [name], MD;  Location: [place];  Service: Gynecology;  Laterality: Bilateral;   KNEE ARTHROSCOPY Right 01/23/2022   Procedure: RIGHT KNEE ARTHROSCOPY WITH DEBRIDEMENT;  Surgeon: Vanetta Mulders, MD;  Location: [place];  Service: Orthopedics;  Laterality: Right;   KNEE RECONSTRUCTION Right 01/23/2022   Procedure: RIGHT KNEE PATELLOFEMORAL LIGAMENT RECONSTRUCTION;  Surgeon: Vanetta Mulders, MD;  Location: [place];  Service: Orthopedics;  Laterality: Right;   Patient Active Problem List   Diagnosis Date Noted   PVNS (pigmented villonodular synovitis)    Patellar instability of right knee    Status post hysterectomy 07/28/2019   Anemia due to acute blood loss 07/23/2019   Fibroid uterus 04/27/2019   Family history of breast cancer in first degree relative 04/27/2019   Abnormal uterine bleeding  04/27/2019    REFERRING PROVIDER: Vanetta Mulders, MD  REFERRING DIAG: M25.361 (ICD-10-CM) - Patellar instability of right knee  THERAPY DIAG:  Acute pain of right knee  Stiffness of right knee, not elsewhere classified  Muscle weakness (generalized)  Difficulty in walking, not elsewhere classified  Rationale for Evaluation and Treatment Rehabilitation  ONSET DATE: 01/23/22  RIGHT KNEE PATELLOFEMORAL LIGAMENT RECONSTRUCTION Right General  RIGHT KNEE ARTHROSCOPY WITH DEBRIDEMENT      SUBJECTIVE:   SUBJECTIVE STATEMENT: Pt is 6 weeks and 3 days s/p Right knee medial patellofemoral ligament reconstruction with gracilis allograft and limited synovectomy.   "My nerves are bad today".  Pt reports the nerves in her leg are bothering her and she has a burning sensation in her knee.  Pt states she was in a lot of pain this AM though feeling better now that she took her pain meds.  Pt states she had increased post knee pain after prior Rx.  Pt reports compliance with HEP.  Pt reports improved bending of knee.  Pt reports increased pain with prolonged ambulation.  Pt is fearful of putting weight thru R LE.    PERTINENT HISTORY: H/o c-section Hx of patellar instability  PAIN:  Are you having pain? Yes: NPRS scale: 6-7/10 Pain location: Rt knee Pain description: discomfort Aggravating factors: bending Relieving factors:  ice  PRECAUTIONS: Knee  WEIGHT BEARING RESTRICTIONS Yes as tolerated in brace & crutches  FALLS:  Has patient fallen in last 6 months? No  LIVING ENVIRONMENT: Lives with: lives with their family Lives in: House/apartment  PLOF: Independent  PATIENT GOALS decrease pain   OBJECTIVE:   TODAY'S TREATMENT:  Therapeutic Exercise: Reviewed response to prior Rx, current function, HEP compliance, and pain levels.   Pt received R knee flexion and extension PROM in supine. Pt received manual knee extension stretch with heel propped on half roll. Assessed ROM. Pt  performed: Supine heel slides with strap AAROM x15 reps Seated knee flexion stretch with contralateral LE 5 reps with 5 sec hold Longsitting gastroc stretch with strap 3x30 sec Supine HS stretch with strap 2x30 sec Seated HS stretch 2x30 sec Standing R SLR flexion in brace locked 2x10 Longsittinghip abduction 2x10 reps   See below for pt education  R knee flexion A/AAROM:  82/86 deg R knee extension AROM/PROM:  16/12 deg  Updated HEP and gave pt a HEP handout.  Educated pt in correct form and appropriate frequency.      Gait Training:  pt ambulated with 1 crutch with brace locked at 0-60 deg.  She was hesitant with Wb'ing thru R LE. She still lacks flexion and extension ROM though demonstrates improved heel strike.  PT instructed pt in improving extension and flexion ROM with gait, heel to toe gait, and increasing Wb'ing thru R LE.  PT demonstrates improved heel to toe gait pattern.   Standing weight shifts onto R LE with brace and bilat UE support.  PATIENT EDUCATION:  Education details:  Educated pt concerning her deficits with ROM and quad strength.  Educated pt in appropriate ROM per protocol and instructed pt on importance of regaining motion.  Instructed pt to cont with HEP and focus on ROM.  Updated HEP and gave pt a handout. Person educated: Patient Education method: Explanation, Demonstration, Tactile cues, Verbal cues, and Handouts Education comprehension: verbalized understanding, returned demonstration, verbal cues required, tactile cues required, and needs further education   HOME EXERCISE PROGRAM: [id number]  Updated HEP: - Seated HS stretch 2x/day, 2-3 reps, 20-30 sec hold   ASSESSMENT:  CLINICAL IMPRESSION: Pt is behind in protocol.  She is fearful of Wb'ing thru R LE though is improving with gait.  She is significantly weak in R LE especially quad.  Pt is performing standing SLR with brace though requires cuing for quad contraction before lifting LE.  Pt uses her  hands to move her R LE with mobility on the table.  Pt requires cuing to not use her hands to move R LE, but to use the muscles in her R LE to move her leg.  Pt c/o's of posterior knee pain which interferes with knee flexion ROM.  PT had pt perform calf stretches and HS stretches and pt reports improving her post knee sx's some.  Updated HEP with seated HS stretch.  Pt continues to be very limited with ROM. She has improved overall with flexion though is not improving with extension.  Pt is guarded with knee PROM requiring cuing to relax LE during PROM.  Pt reports no increased pain after Rx.  Pt should benefit from cont skilled PT services per protocol to address impairments and goals and to assist with restoring desired level of function.    OBJECTIVE IMPAIRMENTS decreased activity tolerance, difficulty walking, decreased ROM, decreased strength, increased edema, increased muscle spasms, impaired flexibility, improper body mechanics, and  pain.   ACTIVITY LIMITATIONS lifting, bending, standing, squatting, stairs, transfers, bed mobility, bathing, dressing, locomotion level, and caring for others  PARTICIPATION LIMITATIONS: meal prep, cleaning, laundry, driving, shopping, and community activity  PERSONAL FACTORS 1 comorbidity: h/o c-section limiting core stability  are also affecting patient's functional outcome.   REHAB POTENTIAL: Good  CLINICAL DECISION MAKING: Stable/uncomplicated  EVALUATION COMPLEXITY: Low   GOALS: Goals reviewed with patient? Yes  SHORT TERM GOALS: Target date: 02/26/2022  Knee comfortably to 0-130 Baseline:see table Goal status: INITIAL  2.  Able to hold a quad activation for 20s Baseline: unable at eval Goal status: INITIAL   LONG TERM GOALS: Target date: 04/23/2022   Able to navigate stairs step over step pattern without pain Baseline: unable at eval Goal status: INITIAL  2.  LEFS score to improve within 85% of full function Baseline: see obj Goal  status: INITIAL  3.  Gross LE strength 90% compared to contralateral LE via hand held dynamometry testing Baseline: unable to test at eval Goal status: INITIAL    PLAN: PT FREQUENCY: 1-2x/week  PT DURATION: 12 weeks  PLANNED INTERVENTIONS: Therapeutic exercises, Therapeutic activity, Neuromuscular re-education, Balance training, Gait training, Patient/Family education, Self Care, Joint mobilization, Stair training, Aquatic Therapy, Dry Needling, Electrical stimulation, Cryotherapy, Moist heat, Taping, Vasopneumatic device, Ionotophoresis '4mg'$ /ml Dexamethasone, Manual therapy, and Re-evaluation  PLAN FOR NEXT SESSION: continue per Dr. Eddie Dibbles MPFL reconstruction protocol.  Add bike for ROM next visit.  [place] e-stim to improve quad activation.  Work on knee ROM.     Selinda Michaels III PT, DPT 03/09/22 1:52 PM

## 2022-03-09 NOTE — Progress Notes (Signed)
Post Operative Evaluation    Procedure/Date of Surgery: Right knee MPFL reconstruction 7/18  Interval History:   Presents today 6 weeks status post the above procedure.  Overall she is doing well.  She is working on regaining her range of motion.  She is having some quad weakness with limited extension at this time.  PMH/PSH/Family History/Social History/Meds/Allergies:    Past Medical History:  Diagnosis Date   Allergies    Anxiety    Bipolar disorder (HCC)    Chronic headaches    Depression    Fatigue    Menstrual pain    Panic attacks    PTSD (post-traumatic stress disorder)    Seizure ([place])    as a child - last one at age 89- petite mal   Vision changes    Past Surgical History:  Procedure Laterality Date   CESAREAN SECTION      x 2   HYSTERECTOMY ABDOMINAL WITH SALPINGECTOMY Bilateral 07/28/2019   Procedure: HYSTERECTOMY ABDOMINAL WITH BILATERAL SALPINGECTOMY AND RIGHT OOPHORECTOMY;  Surgeon: [name], MD;  Location: [place];  Service: Gynecology;  Laterality: Bilateral;   KNEE ARTHROSCOPY Right 01/23/2022   Procedure: RIGHT KNEE ARTHROSCOPY WITH DEBRIDEMENT;  Surgeon: Vanetta Mulders, MD;  Location: [place];  Service: Orthopedics;  Laterality: Right;   KNEE RECONSTRUCTION Right 01/23/2022   Procedure: RIGHT KNEE PATELLOFEMORAL LIGAMENT RECONSTRUCTION;  Surgeon: Vanetta Mulders, MD;  Location: [place];  Service: Orthopedics;  Laterality: Right;   Social History   Socioeconomic History   Marital status: Single    Spouse name: Not on file   Number of children: Not on file   Years of education: Not on file   Highest education level: Not on file  Occupational History   Not on file  Tobacco Use   Smoking status: Former    Types: Cigarettes    Quit date: 09/2015    Years since quitting: 6.5   Smokeless tobacco: Never   Tobacco comments:    smoked off and on  Vaping Use   Vaping Use: Never  used  Substance and Sexual Activity   Alcohol use: Yes    Comment: occasional   Drug use: Never   Sexual activity: Not on file  Other Topics Concern   Not on file  Social History Narrative   Right handed    Caffeine- rare    Social Determinants of Health   Financial Resource Strain: Not on file  Food Insecurity: Not on file  Transportation Needs: Not on file  Physical Activity: Not on file  Stress: Not on file  Social Connections: Not on file   Family History  Problem Relation Age of Onset   Diabetes Mother    Cerebrovascular Accident Father        x2   Diabetes Maternal Grandmother    Kidney disease Maternal Grandmother    Lung cancer Maternal Grandfather    Breast cancer Neg Hx    Allergies  Allergen Reactions   No Known Allergies Other (See Comments)   Pineapple Nausea And Vomiting   Other Rash    pineapple,cabbage   Current Outpatient Medications  Medication Sig Dispense Refill   aspirin EC 325 MG tablet Take 1 tablet (325 mg total) by mouth daily. 30 tablet 0   butalbital-acetaminophen-caffeine (FIORICET) 50-325-40  MG tablet Take by mouth 2 (two) times daily as needed for headache.     lidocaine (LIDODERM) 5 % Place 1 patch onto the skin daily. Remove & Discard patch within 12 hours or as directed by MD 30 patch 0   oxycodone (OXY-IR) 5 MG capsule Take 1 capsule (5 mg total) by mouth every 4 (four) hours as needed (severe pain). 20 capsule 0   oxyCODONE (ROXICODONE) 5 MG immediate release tablet Take 1 tablet (5 mg total) by mouth every 4 (four) hours as needed for severe pain. 30 tablet 0   topiramate (TOPAMAX) 25 MG tablet Take 50 mg at bedtime for one week, then increase to 75 mg at bedtime for one week, then increase to 100 mg at bedtime 120 tablet 3   zolmitriptan (ZOMIG) 5 MG tablet Take 1 tablet (5 mg total) by mouth as needed for migraine. 10 tablet 3   Current Facility-Administered Medications  Medication Dose Route Frequency Provider Last Rate Last  Admin   oxyCODONE (Oxy IR/ROXICODONE) immediate release tablet 5 mg  5 mg Oral Q4H PRN Vanetta Mulders, MD       No results found.  Review of Systems:   A ROS was performed including pertinent positives and negatives as documented in the HPI.   Musculoskeletal Exam:     Right knee incisions are well-appearing.  No erythema or drainage.  There is fullness with a small effusion in the knee.  Range of motion is from 2 degrees to approximately 100 degrees.  Brace is in place.  Negative apprehension with lateral patellar force.  There is quadriceps atrophy with decreased tone.  Distal neurosensory exam is intact  Imaging:    None  I personally reviewed and interpreted the radiographs.   Assessment:   46 year old female who is 6 weeks status post MPFL reconstruction.  She is continuing to make improvement with range of motion although this has been somewhat limited as her quadriceps muscle is essentially shut down at this time.  At this time I would like her to continue to work through physical therapy for stimulation of the quadriceps tendon muscle.  I will plan to see her back in 1 month for reassessment Plan :    -Return to clinic in 4 weeks for assessment      I personally saw and evaluated the patient, and participated in the management and treatment plan.  Vanetta Mulders, MD Attending Physician, Orthopedic Surgery  This document was dictated using Dragon voice recognition software. A reasonable attempt at proof reading has been made to minimize errors.

## 2022-03-12 ENCOUNTER — Encounter (HOSPITAL_BASED_OUTPATIENT_CLINIC_OR_DEPARTMENT_OTHER): Payer: Self-pay | Admitting: Orthopaedic Surgery

## 2022-03-12 ENCOUNTER — Other Ambulatory Visit (HOSPITAL_BASED_OUTPATIENT_CLINIC_OR_DEPARTMENT_OTHER): Payer: Self-pay | Admitting: Orthopaedic Surgery

## 2022-03-13 ENCOUNTER — Ambulatory Visit (HOSPITAL_BASED_OUTPATIENT_CLINIC_OR_DEPARTMENT_OTHER): Payer: Medicaid Other | Admitting: Physical Therapy

## 2022-03-14 ENCOUNTER — Telehealth: Payer: Self-pay | Admitting: Orthopaedic Surgery

## 2022-03-14 ENCOUNTER — Other Ambulatory Visit (HOSPITAL_BASED_OUTPATIENT_CLINIC_OR_DEPARTMENT_OTHER): Payer: Self-pay | Admitting: Orthopaedic Surgery

## 2022-03-14 MED ORDER — TRAMADOL HCL 50 MG PO TABS: 50.0000 mg | ORAL_TABLET | Freq: Four times a day (QID) | ORAL | 0 refills | Status: DC | PRN

## 2022-03-14 NOTE — Telephone Encounter (Signed)
Pt called stating Dr Sammuel Hines was to send in pain med. Please call pt about this matter. Pt phone number is [phone].

## 2022-03-15 NOTE — Therapy (Signed)
OUTPATIENT PHYSICAL THERAPY TREATMENT      Patient Name: Sarah Barber MRN: [id number] DOB:Mar 12, 1976, 46 y.o., female Today's Date: 03/16/2022   PT End of Session - 03/16/22 1206     Visit Number 14    Number of Visits 27    Date for PT Re-Evaluation 04/21/22    Authorization Type [employer] healthy blue    PT Start Time 1155    PT Stop Time 1245    PT Time Calculation (min) 50 min    Activity Tolerance Patient tolerated treatment well    Behavior During Therapy WFL for tasks assessed/performed                Past Medical History:  Diagnosis Date   Allergies    Anxiety    Bipolar disorder (HCC)    Chronic headaches    Depression    Fatigue    Menstrual pain    Panic attacks    PTSD (post-traumatic stress disorder)    Seizure ([place])    as a child - last one at age 89- petite mal   Vision changes    Past Surgical History:  Procedure Laterality Date   CESAREAN SECTION      x 2   HYSTERECTOMY ABDOMINAL WITH SALPINGECTOMY Bilateral 07/28/2019   Procedure: HYSTERECTOMY ABDOMINAL WITH BILATERAL SALPINGECTOMY AND RIGHT OOPHORECTOMY;  Surgeon: [name], MD;  Location: [place];  Service: Gynecology;  Laterality: Bilateral;   KNEE ARTHROSCOPY Right 01/23/2022   Procedure: RIGHT KNEE ARTHROSCOPY WITH DEBRIDEMENT;  Surgeon: Vanetta Mulders, MD;  Location: [place];  Service: Orthopedics;  Laterality: Right;   KNEE RECONSTRUCTION Right 01/23/2022   Procedure: RIGHT KNEE PATELLOFEMORAL LIGAMENT RECONSTRUCTION;  Surgeon: Vanetta Mulders, MD;  Location: [place];  Service: Orthopedics;  Laterality: Right;   Patient Active Problem List   Diagnosis Date Noted   PVNS (pigmented villonodular synovitis)    Patellar instability of right knee    Status post hysterectomy 07/28/2019   Anemia due to acute blood loss 07/23/2019   Fibroid uterus 04/27/2019   Family history of breast cancer in first degree relative 04/27/2019   Abnormal uterine bleeding  04/27/2019    REFERRING PROVIDER: Vanetta Mulders, MD  REFERRING DIAG: M25.361 (ICD-10-CM) - Patellar instability of right knee  THERAPY DIAG:  Acute pain of right knee  Stiffness of right knee, not elsewhere classified  Muscle weakness (generalized)  Difficulty in walking, not elsewhere classified  Rationale for Evaluation and Treatment Rehabilitation  ONSET DATE: 01/23/22  RIGHT KNEE PATELLOFEMORAL LIGAMENT RECONSTRUCTION Right General  RIGHT KNEE ARTHROSCOPY WITH DEBRIDEMENT      SUBJECTIVE:   SUBJECTIVE STATEMENT: Pt is 7 weeks and 3 days s/p Right knee medial patellofemoral ligament reconstruction with gracilis allograft and limited synovectomy.   Pt reports compliance with HEP.  Pt thinks she has better ROM.  Pt reports she had increased pain after prior Rx and used motrin which didn't really help.  Pt states MD ordered her tramadol which she has now.  Pt states her leg swung back in a bent position when sitting on couch.  She had increased pain and felt a pop.  She then felt better afterwards and states the post knee pain she had is better.  Pt still c/o's of swelling.  Pt is limited with Wb'ing thru R LE, standing duration, and ambulation.   PERTINENT HISTORY: H/o c-section Hx of patellar instability  PAIN:  Are you having pain? Yes: NPRS scale: 5-6/10 Pain location: Rt  knee Pain description: discomfort Aggravating factors: bending Relieving factors: ice  PRECAUTIONS: Knee  WEIGHT BEARING RESTRICTIONS Yes as tolerated in brace & crutches  FALLS:  Has patient fallen in last 6 months? No  LIVING ENVIRONMENT: Lives with: lives with their family Lives in: House/apartment  PLOF: Independent  PATIENT GOALS decrease pain   OBJECTIVE:   TODAY'S TREATMENT:  Therapeutic Exercise: -Reviewed response to prior Rx, current function, HEP compliance, and pain levels.    -Pt received R knee flexion and extension PROM in supine. -Pt received manual knee extension  stretch with heel propped on half roll. -Assessed ROM.   -Pt performed: Recumbent bike rocking back and forth x 5 mins Prone knee extension hang 2x2-2.5 mins     Supine heel slides with strap AAROM x10 reps     Longsitting hip abduction 2x10 reps Standing heel raises in brace 2x10 with UE support Standing toe raises in brace 2x10 with UE support  -R knee flexion AAROM:  91 deg  R knee extension AROM: 16 deg at beginning of Rx                                           12 deg after prone knee ext hang and stretching  R knee extension PROM:  9 deg   [place] E-stim:  applied to VMO and central quad with active quad set 10 sec on/10 sec off in longsitting for improved quad activation and strength.     PATIENT EDUCATION:  Education details:  Educated pt concerning her deficits with ROM.  Instructed pt to cont with HEP and focus on ROM.  Exercise form and POC.  Instructed pt to not use hands to move R LE but to use her leg to move it.  Person educated: Patient Education method:  Explanation, Demonstration, Tactile cues, Verbal cues Education comprehension: verbalized understanding, returned demonstration, verbal cues required, tactile cues required, and needs further education   HOME EXERCISE PROGRAM: [id number]    ASSESSMENT:  CLINICAL IMPRESSION: Pt is behind in protocol.  Her incisions are healing well and are closed today.  Pt continues to use her hands to move her R LE having decreased activation of R LE mm with mobility and transfers.  PT instructed pt multiple times to decrease assistance with hands and use R LE to move R LE with transfers and mobility.  She continues to be significantly weak in R LE especially her quad.  PT used [place] e-stim today to work on activating her quad.ore lifting LE.  Pt didn't have c/o's of posterior knee pain with Rx today.  She continues to have significant deficits in knee extension ROM.  She presented to Rx with no improvement in extension though  did demonstrate a 4 deg improvement after stretching and prone knee hang.  Pt did demonstrate improved R knee flexion ROM.  Pt has limited [place] in R LE and is slowly improving with gait.  Pt had no significant change in pain after Rx though did have pain with manual knee extension stretch.  Pt initially reported a 5/10 pain after Rx though states it's a 6/10 before she walked out of the clinic. Pt should benefit from cont skilled PT services per protocol to address impairments and goals and to assist with restoring desired level of function.    OBJECTIVE IMPAIRMENTS decreased activity tolerance, difficulty walking, decreased ROM, decreased strength, increased  edema, increased muscle spasms, impaired flexibility, improper body mechanics, and pain.   ACTIVITY LIMITATIONS lifting, bending, standing, squatting, stairs, transfers, bed mobility, bathing, dressing, locomotion level, and caring for others  PARTICIPATION LIMITATIONS: meal prep, cleaning, laundry, driving, shopping, and community activity  PERSONAL FACTORS 1 comorbidity: h/o c-section limiting core stability  are also affecting patient's functional outcome.   REHAB POTENTIAL: Good  CLINICAL DECISION MAKING: Stable/uncomplicated  EVALUATION COMPLEXITY: Low   GOALS: Goals reviewed with patient? Yes  SHORT TERM GOALS: Target date: 02/26/2022  Knee comfortably to 0-130 Baseline:see table Goal status: INITIAL  2.  Able to hold a quad activation for 20s Baseline: unable at eval Goal status: INITIAL   LONG TERM GOALS: Target date: 04/23/2022   Able to navigate stairs step over step pattern without pain Baseline: unable at eval Goal status: INITIAL  2.  LEFS score to improve within 85% of full function Baseline: see obj Goal status: INITIAL  3.  Gross LE strength 90% compared to contralateral LE via hand held dynamometry testing Baseline: unable to test at eval Goal status: INITIAL    PLAN: PT FREQUENCY:  1-2x/week  PT DURATION: 12 weeks  PLANNED INTERVENTIONS: Therapeutic exercises, Therapeutic activity, Neuromuscular re-education, Balance training, Gait training, Patient/Family education, Self Care, Joint mobilization, Stair training, Aquatic Therapy, Dry Needling, Electrical stimulation, Cryotherapy, Moist heat, Taping, Vasopneumatic device, Ionotophoresis '4mg'$ /ml Dexamethasone, Manual therapy, and Re-evaluation  PLAN FOR NEXT SESSION: continue per Dr. Eddie Dibbles MPFL reconstruction protocol.  [place] e-stim to improve quad activation.  Work on knee ROM.   PT sent a message to MD about pt's limited progress and deficits and MD responded to pt about aquatic therapy.    Selinda Michaels III PT, DPT 03/16/22 3:14 PM

## 2022-03-16 ENCOUNTER — Encounter (HOSPITAL_BASED_OUTPATIENT_CLINIC_OR_DEPARTMENT_OTHER): Payer: Self-pay | Admitting: Physical Therapy

## 2022-03-16 ENCOUNTER — Ambulatory Visit (HOSPITAL_BASED_OUTPATIENT_CLINIC_OR_DEPARTMENT_OTHER): Payer: Medicaid Other | Admitting: Physical Therapy

## 2022-03-16 DIAGNOSIS — R262 Difficulty in walking, not elsewhere classified: Secondary | ICD-10-CM

## 2022-03-16 DIAGNOSIS — M25661 Stiffness of right knee, not elsewhere classified: Secondary | ICD-10-CM

## 2022-03-16 DIAGNOSIS — M25561 Pain in right knee: Secondary | ICD-10-CM | POA: Diagnosis not present

## 2022-03-16 DIAGNOSIS — M6281 Muscle weakness (generalized): Secondary | ICD-10-CM

## 2022-03-19 ENCOUNTER — Telehealth: Payer: Self-pay | Admitting: Orthopaedic Surgery

## 2022-03-19 NOTE — Telephone Encounter (Signed)
Pt called asking for a call from [facility]. Please call pt at [phone]. She states some forms for Dr Sammuel Hines to sign for work accomodation need to be faxed back for pt to get her benefit checks. Please call this pt as soon as possible.

## 2022-03-20 ENCOUNTER — Ambulatory Visit (HOSPITAL_BASED_OUTPATIENT_CLINIC_OR_DEPARTMENT_OTHER): Payer: Medicaid Other | Admitting: Physical Therapy

## 2022-03-20 ENCOUNTER — Encounter (HOSPITAL_BASED_OUTPATIENT_CLINIC_OR_DEPARTMENT_OTHER): Payer: Self-pay | Admitting: Physical Therapy

## 2022-03-20 DIAGNOSIS — R262 Difficulty in walking, not elsewhere classified: Secondary | ICD-10-CM

## 2022-03-20 DIAGNOSIS — M25561 Pain in right knee: Secondary | ICD-10-CM

## 2022-03-20 DIAGNOSIS — M6281 Muscle weakness (generalized): Secondary | ICD-10-CM

## 2022-03-20 DIAGNOSIS — M25661 Stiffness of right knee, not elsewhere classified: Secondary | ICD-10-CM

## 2022-03-20 NOTE — Telephone Encounter (Signed)
Responded to patient through [place]

## 2022-03-20 NOTE — Telephone Encounter (Signed)
Spoke to patient directly and explained why she needs to contact [facility] as Dr. Sammuel Hines did not receive any communication from [facility]

## 2022-03-20 NOTE — Therapy (Signed)
OUTPATIENT PHYSICAL THERAPY TREATMENT      Patient Name: Sarah Barber MRN: [id number] DOB:06/17/1976, 46 y.o., female Today's Date: 03/20/2022   PT End of Session - 03/20/22 1120     Visit Number 15    Number of Visits 27    Date for PT Re-Evaluation 04/21/22    Authorization Type MCD healthy blue    Authorization Time Period 8/25-11/22    Authorization - Visit Number 5    Authorization - Number of Visits 11    PT Start Time 1118    PT Stop Time 1200    PT Time Calculation (min) 42 min                Past Medical History:  Diagnosis Date   Allergies    Anxiety    Bipolar disorder (HCC)    Chronic headaches    Depression    Fatigue    Menstrual pain    Panic attacks    PTSD (post-traumatic stress disorder)    Seizure ([place])    as a child - last one at age 17- petite mal   Vision changes    Past Surgical History:  Procedure Laterality Date   CESAREAN SECTION      x 2   HYSTERECTOMY ABDOMINAL WITH SALPINGECTOMY Bilateral 07/28/2019   Procedure: HYSTERECTOMY ABDOMINAL WITH BILATERAL SALPINGECTOMY AND RIGHT OOPHORECTOMY;  Surgeon: [name], MD;  Location: [place];  Service: Gynecology;  Laterality: Bilateral;   KNEE ARTHROSCOPY Right 01/23/2022   Procedure: RIGHT KNEE ARTHROSCOPY WITH DEBRIDEMENT;  Surgeon: Vanetta Mulders, MD;  Location: [place];  Service: Orthopedics;  Laterality: Right;   KNEE RECONSTRUCTION Right 01/23/2022   Procedure: RIGHT KNEE PATELLOFEMORAL LIGAMENT RECONSTRUCTION;  Surgeon: Vanetta Mulders, MD;  Location: [place];  Service: Orthopedics;  Laterality: Right;   Patient Active Problem List   Diagnosis Date Noted   PVNS (pigmented villonodular synovitis)    Patellar instability of right knee    Status post hysterectomy 07/28/2019   Anemia due to acute blood loss 07/23/2019   Fibroid uterus 04/27/2019   Family history of breast cancer in first degree relative 04/27/2019   Abnormal uterine bleeding  04/27/2019    REFERRING PROVIDER: Vanetta Mulders, MD  REFERRING DIAG: M25.361 (ICD-10-CM) - Patellar instability of right knee  THERAPY DIAG:  Acute pain of right knee  Stiffness of right knee, not elsewhere classified  Muscle weakness (generalized)  Difficulty in walking, not elsewhere classified  Rationale for Evaluation and Treatment Rehabilitation  ONSET DATE: 01/23/22  RIGHT KNEE PATELLOFEMORAL LIGAMENT RECONSTRUCTION Right General  RIGHT KNEE ARTHROSCOPY WITH DEBRIDEMENT      SUBJECTIVE:   SUBJECTIVE STATEMENT: Pt reports she was "so, so, so sore.  A lot of pain, but no swelling".  after last session.  She has been trying to use her arms less to move her RLE.  She reports she has a lot of fear regarding putting weight on her RLE and isn't comfortable touching/massaging her knee.   PERTINENT HISTORY: H/o c-section Hx of patellar instability  PAIN:  Are you having pain? Yes: NPRS scale: 4/10 Pain location: Rt knee Pain description: discomfort Aggravating factors: bending Relieving factors: ice  PRECAUTIONS: Knee  WEIGHT BEARING RESTRICTIONS Yes as tolerated in brace & crutches  FALLS:  Has patient fallen in last 6 months? No  LIVING ENVIRONMENT: Lives with: lives with their family Lives in: House/apartment  PLOF: Independent  PATIENT GOALS decrease pain   OBJECTIVE:   TODAY'S  TREATMENT: Pt seen for aquatic therapy today.  Treatment took place in water 3.25-4 ft in depth at the [facility] pool. Temp of water was 91.  Pt entered/exited the pool with supervision, bilat rail with step-to pattern.   Therapist on deck giving direction to pt in water.    At stairs, UE on rails:  * Rt toe taps for hip activation * Rt hamstring stretch with overpressure; repeated after squats  * Rt forward lunge to/from hamstring stretch for knee ROM Holding wall: * weight shifts R/L; squats; R hip abdct/ add; Rt hip ext   * weight shifts forward/backward in  staggered stance (heavy cues) * forward/ backward gait with unilateral UE on wall, cues for heel/toe pattern * SLS on R x 20sec x 2  * forward gait with cues for step length L, and heel strike R * L forward step ups (out of pool) x 6   Pt requires the buoyancy of water for active assisted exercises with buoyancy supported for strengthening and AROM exercises. Hydrostatic pressure also supports joints by unweighting joint load by at least 50 % in 3-4 feet depth water. 80% in chest to neck deep water. Water will provide assistance with movement using the current and laminar flow while the buoyancy reduces weight bearing. Pt requires the viscosity of the water for resistance with strengthening exercises.     PATIENT EDUCATION:  Education details:  scar mobilization and desensitization Person educated: Patient Education method:  Explanation, Demonstration, Tactile cues, Verbal cues Education comprehension: verbalized understanding, returned demonstration, verbal cues required, tactile cues required, and needs further education   HOME EXERCISE PROGRAM: [id number]    ASSESSMENT:  CLINICAL IMPRESSION: Pt is 8 wkd s/p Right knee medial patellofemoral ligament reconstruction with gracilis allograft and limited synovectomy. Pt given encouragement throughout session to decrease her fears of pain and knee buckling with WB into RLE.  Cues given throughout to activate muscles in RLE, otherwise leg wanted to float up towards surface.  Pt able to complete Rt SLS with UE on wall for up to 20 sec at 44f depth of water.  Pain reported to decrease by 1 point during session.  Gait quality improved with UE on wall and repetition of cues. Pt should benefit from cont skilled PT services per protocol to address impairments and goals and to assist with restoring desired level of function.    OBJECTIVE IMPAIRMENTS decreased activity tolerance, difficulty walking, decreased ROM, decreased strength, increased edema,  increased muscle spasms, impaired flexibility, improper body mechanics, and pain.   ACTIVITY LIMITATIONS lifting, bending, standing, squatting, stairs, transfers, bed mobility, bathing, dressing, locomotion level, and caring for others  PARTICIPATION LIMITATIONS: meal prep, cleaning, laundry, driving, shopping, and community activity  PERSONAL FACTORS 1 comorbidity: h/o c-section limiting core stability  are also affecting patient's functional outcome.   REHAB POTENTIAL: Good  CLINICAL DECISION MAKING: Stable/uncomplicated  EVALUATION COMPLEXITY: Low   GOALS: Goals reviewed with patient? Yes  SHORT TERM GOALS: Target date: 02/26/2022  Knee comfortably to 0-130 Baseline:see table Goal status: INITIAL  2.  Able to hold a quad activation for 20s Baseline: unable at eval Goal status: INITIAL   LONG TERM GOALS: Target date: 04/23/2022   Able to navigate stairs step over step pattern without pain Baseline: unable at eval Goal status: INITIAL  2.  LEFS score to improve within 85% of full function Baseline: see obj Goal status: INITIAL  3.  Gross LE strength 90% compared to contralateral LE via hand held dynamometry  testing Baseline: unable to test at eval Goal status: INITIAL    PLAN: PT FREQUENCY: 1-2x/week  PT DURATION: 12 weeks  PLANNED INTERVENTIONS: Therapeutic exercises, Therapeutic activity, Neuromuscular re-education, Balance training, Gait training, Patient/Family education, Self Care, Joint mobilization, Stair training, Aquatic Therapy, Dry Needling, Electrical stimulation, Cryotherapy, Moist heat, Taping, Vasopneumatic device, Ionotophoresis '4mg'$ /ml Dexamethasone, Manual therapy, and Re-evaluation  PLAN FOR NEXT SESSION: continue per Dr. Eddie Dibbles MPFL reconstruction protocol.  [place] e-stim to improve quad activation.  Work on knee ROM, weight bearing, gait.   Kerin Perna, PTA 03/20/22 12:22 PM [facility] [address] [place], [place], [address] Phone: [phone]   Fax:  [phone]

## 2022-03-23 ENCOUNTER — Ambulatory Visit (HOSPITAL_BASED_OUTPATIENT_CLINIC_OR_DEPARTMENT_OTHER): Payer: Medicaid Other | Admitting: Physical Therapy

## 2022-03-23 NOTE — Therapy (Deleted)
OUTPATIENT PHYSICAL THERAPY TREATMENT      Patient Name: Sarah Barber MRN: [id number] DOB:[birth year], 46 y.o., female Today's Date: 03/23/2022        Past Medical History:  Diagnosis Date   Allergies    Anxiety    Bipolar disorder (HCC)    Chronic headaches    Depression    Fatigue    Menstrual pain    Panic attacks    PTSD (post-traumatic stress disorder)    Seizure ([place])    as a child - last one at age 69- petite mal   Vision changes    Past Surgical History:  Procedure Laterality Date   CESAREAN SECTION      x 2   HYSTERECTOMY ABDOMINAL WITH SALPINGECTOMY Bilateral 07/28/2019   Procedure: HYSTERECTOMY ABDOMINAL WITH BILATERAL SALPINGECTOMY AND RIGHT OOPHORECTOMY;  Surgeon: [name], MD;  Location: [place];  Service: Gynecology;  Laterality: Bilateral;   KNEE ARTHROSCOPY Right 01/23/2022   Procedure: RIGHT KNEE ARTHROSCOPY WITH DEBRIDEMENT;  Surgeon: Vanetta Mulders, MD;  Location: [place];  Service: Orthopedics;  Laterality: Right;   KNEE RECONSTRUCTION Right 01/23/2022   Procedure: RIGHT KNEE PATELLOFEMORAL LIGAMENT RECONSTRUCTION;  Surgeon: Vanetta Mulders, MD;  Location: [place];  Service: Orthopedics;  Laterality: Right;   Patient Active Problem List   Diagnosis Date Noted   PVNS (pigmented villonodular synovitis)    Patellar instability of right knee    Status post hysterectomy 07/28/2019   Anemia due to acute blood loss 07/23/2019   Fibroid uterus 04/27/2019   Family history of breast cancer in first degree relative 04/27/2019   Abnormal uterine bleeding 04/27/2019    REFERRING PROVIDER: Vanetta Mulders, MD  REFERRING DIAG: M25.361 (ICD-10-CM) - Patellar instability of right knee  THERAPY DIAG:  No diagnosis found.  Rationale for Evaluation and Treatment Rehabilitation  ONSET DATE: 01/23/22  RIGHT KNEE PATELLOFEMORAL LIGAMENT RECONSTRUCTION Right General  RIGHT KNEE ARTHROSCOPY WITH DEBRIDEMENT       SUBJECTIVE:   SUBJECTIVE STATEMENT: Pt reports she was "so, so, so sore.  A lot of pain, but no swelling".  after last session.  She has been trying to use her arms less to move her RLE.  She reports she has a lot of fear regarding putting weight on her RLE and isn't comfortable touching/massaging her knee.   PERTINENT HISTORY: H/o c-section Hx of patellar instability  PAIN:  Are you having pain? Yes: NPRS scale: 4/10 Pain location: Rt knee Pain description: discomfort Aggravating factors: bending Relieving factors: ice  PRECAUTIONS: Knee  WEIGHT BEARING RESTRICTIONS Yes as tolerated in brace & crutches  FALLS:  Has patient fallen in last 6 months? No  LIVING ENVIRONMENT: Lives with: lives with their family Lives in: House/apartment  PLOF: Independent  PATIENT GOALS decrease pain   OBJECTIVE:   TODAY'S TREATMENT: Pt seen for aquatic therapy today.  Treatment took place in water 3.25-4 ft in depth at the [facility] pool. Temp of water was 91.  Pt entered/exited the pool with supervision, bilat rail with step-to pattern.   Therapist on deck giving direction to pt in water.    At stairs, UE on rails:  * Rt toe taps for hip activation * Rt hamstring stretch with overpressure; repeated after squats  * Rt forward lunge to/from hamstring stretch for knee ROM Holding wall: * weight shifts R/L; squats; R hip abdct/ add; Rt hip ext   * weight shifts forward/backward in staggered stance (heavy cues) * forward/ backward gait  with unilateral UE on wall, cues for heel/toe pattern * SLS on R x 20sec x 2  * forward gait with cues for step length L, and heel strike R * L forward step ups (out of pool) x 6   Pt requires the buoyancy of water for active assisted exercises with buoyancy supported for strengthening and AROM exercises. Hydrostatic pressure also supports joints by unweighting joint load by at least 50 % in 3-4 feet depth water. 80% in chest to neck deep  water. Water will provide assistance with movement using the current and laminar flow while the buoyancy reduces weight bearing. Pt requires the viscosity of the water for resistance with strengthening exercises.     PATIENT EDUCATION:  Education details:  scar mobilization and desensitization Person educated: Patient Education method:  Explanation, Demonstration, Tactile cues, Verbal cues Education comprehension: verbalized understanding, returned demonstration, verbal cues required, tactile cues required, and needs further education   HOME EXERCISE PROGRAM: [id number]    ASSESSMENT:  CLINICAL IMPRESSION: Pt is 8 wkd s/p Right knee medial patellofemoral ligament reconstruction with gracilis allograft and limited synovectomy. Pt given encouragement throughout session to decrease her fears of pain and knee buckling with WB into RLE.  Cues given throughout to activate muscles in RLE, otherwise leg wanted to float up towards surface.  Pt able to complete Rt SLS with UE on wall for up to 20 sec at 44f depth of water.  Pain reported to decrease by 1 point during session.  Gait quality improved with UE on wall and repetition of cues. Pt should benefit from cont skilled PT services per protocol to address impairments and goals and to assist with restoring desired level of function.    OBJECTIVE IMPAIRMENTS decreased activity tolerance, difficulty walking, decreased ROM, decreased strength, increased edema, increased muscle spasms, impaired flexibility, improper body mechanics, and pain.   ACTIVITY LIMITATIONS lifting, bending, standing, squatting, stairs, transfers, bed mobility, bathing, dressing, locomotion level, and caring for others  PARTICIPATION LIMITATIONS: meal prep, cleaning, laundry, driving, shopping, and community activity  PERSONAL FACTORS 1 comorbidity: h/o c-section limiting core stability  are also affecting patient's functional outcome.   REHAB POTENTIAL: Good  CLINICAL  DECISION MAKING: Stable/uncomplicated  EVALUATION COMPLEXITY: Low   GOALS: Goals reviewed with patient? Yes  SHORT TERM GOALS: Target date: 02/26/2022  Knee comfortably to 0-130 Baseline:see table Goal status: INITIAL  2.  Able to hold a quad activation for 20s Baseline: unable at eval Goal status: INITIAL   LONG TERM GOALS: Target date: 04/23/2022   Able to navigate stairs step over step pattern without pain Baseline: unable at eval Goal status: INITIAL  2.  LEFS score to improve within 85% of full function Baseline: see obj Goal status: INITIAL  3.  Gross LE strength 90% compared to contralateral LE via hand held dynamometry testing Baseline: unable to test at eval Goal status: INITIAL    PLAN: PT FREQUENCY: 1-2x/week  PT DURATION: 12 weeks  PLANNED INTERVENTIONS: Therapeutic exercises, Therapeutic activity, Neuromuscular re-education, Balance training, Gait training, Patient/Family education, Self Care, Joint mobilization, Stair training, Aquatic Therapy, Dry Needling, Electrical stimulation, Cryotherapy, Moist heat, Taping, Vasopneumatic device, Ionotophoresis '4mg'$ /ml Dexamethasone, Manual therapy, and Re-evaluation  PLAN FOR NEXT SESSION: continue per Dr. BEddie DibblesMPFL reconstruction protocol.  [place]-stim to improve quad activation.  Work on knee ROM, weight bearing, gait.   [id number] Annison Birchard MPT 03/23/22 7:22 AM [facility] [address] [address]Phone: [phone]  Fax:  [phone]

## 2022-03-27 ENCOUNTER — Ambulatory Visit (HOSPITAL_BASED_OUTPATIENT_CLINIC_OR_DEPARTMENT_OTHER): Payer: Medicaid Other | Admitting: Physical Therapy

## 2022-03-27 ENCOUNTER — Encounter (HOSPITAL_BASED_OUTPATIENT_CLINIC_OR_DEPARTMENT_OTHER): Payer: Self-pay | Admitting: Physical Therapy

## 2022-03-27 DIAGNOSIS — M25561 Pain in right knee: Secondary | ICD-10-CM

## 2022-03-27 DIAGNOSIS — M6281 Muscle weakness (generalized): Secondary | ICD-10-CM

## 2022-03-27 DIAGNOSIS — M25661 Stiffness of right knee, not elsewhere classified: Secondary | ICD-10-CM

## 2022-03-27 DIAGNOSIS — R262 Difficulty in walking, not elsewhere classified: Secondary | ICD-10-CM

## 2022-03-27 NOTE — Therapy (Signed)
OUTPATIENT PHYSICAL THERAPY TREATMENT      Patient Name: Sarah Barber MRN: [id number] DOB:[birth year], 46 y.o., female Today's Date: 03/27/2022   PT End of Session - 03/27/22 1215     Visit Number 16    Number of Visits 27    Date for PT Re-Evaluation 04/21/22    Authorization Type MCD healthy blue    Authorization Time Period 8/25-11/22    PT Start Time 1201    PT Stop Time 1245    PT Time Calculation (min) 44 min    Activity Tolerance Patient tolerated treatment well;Patient limited by pain    Behavior During Therapy WFL for tasks assessed/performed                 Past Medical History:  Diagnosis Date   Allergies    Anxiety    Bipolar disorder (HCC)    Chronic headaches    Depression    Fatigue    Menstrual pain    Panic attacks    PTSD (post-traumatic stress disorder)    Seizure ([place])    as a child - last one at age 30- petite mal   Vision changes    Past Surgical History:  Procedure Laterality Date   CESAREAN SECTION      x 2   HYSTERECTOMY ABDOMINAL WITH SALPINGECTOMY Bilateral 07/28/2019   Procedure: HYSTERECTOMY ABDOMINAL WITH BILATERAL SALPINGECTOMY AND RIGHT OOPHORECTOMY;  Surgeon: [name], MD;  Location: [place];  Service: Gynecology;  Laterality: Bilateral;   KNEE ARTHROSCOPY Right 01/23/2022   Procedure: RIGHT KNEE ARTHROSCOPY WITH DEBRIDEMENT;  Surgeon: Vanetta Mulders, MD;  Location: [place];  Service: Orthopedics;  Laterality: Right;   KNEE RECONSTRUCTION Right 01/23/2022   Procedure: RIGHT KNEE PATELLOFEMORAL LIGAMENT RECONSTRUCTION;  Surgeon: Vanetta Mulders, MD;  Location: [place];  Service: Orthopedics;  Laterality: Right;   Patient Active Problem List   Diagnosis Date Noted   PVNS (pigmented villonodular synovitis)    Patellar instability of right knee    Status post hysterectomy 07/28/2019   Anemia due to acute blood loss 07/23/2019   Fibroid uterus 04/27/2019   Family history of breast  cancer in first degree relative 04/27/2019   Abnormal uterine bleeding 04/27/2019    REFERRING PROVIDER: Vanetta Mulders, MD  REFERRING DIAG: M25.361 (ICD-10-CM) - Patellar instability of right knee  THERAPY DIAG:  Acute pain of right knee  Stiffness of right knee, not elsewhere classified  Muscle weakness (generalized)  Difficulty in walking, not elsewhere classified  Rationale for Evaluation and Treatment Rehabilitation  ONSET DATE: 01/23/22  RIGHT KNEE PATELLOFEMORAL LIGAMENT RECONSTRUCTION Right General  RIGHT KNEE ARTHROSCOPY WITH DEBRIDEMENT      SUBJECTIVE:   SUBJECTIVE STATEMENT: Pt reports compliance with HEP.  No change in pain, increase in knee pain after each therapy session   PERTINENT HISTORY: H/o c-section Hx of patellar instability  PAIN:  Are you having pain? Yes: NPRS scale: 4/10 Pain location: Rt knee Pain description: discomfort Aggravating factors: bending Relieving factors: ice  PRECAUTIONS: Knee  WEIGHT BEARING RESTRICTIONS Yes as tolerated in brace & crutches  FALLS:  Has patient fallen in last 6 months? No  LIVING ENVIRONMENT: Lives with: lives with their family Lives in: House/apartment  PLOF: Independent  PATIENT GOALS decrease pain   OBJECTIVE:   TODAY'S TREATMENT: Pt seen for aquatic therapy today.  Treatment took place in water 3.25-4 ft in depth at the [facility] pool. Temp of water was 91.  Pt entered/exited the  pool with supervision, bilat rail with step-to pattern.   Therapist on deck giving direction to pt in water.    Holding wall: * weight shifts R/L ; squats; R&L hip abdct/ add;  hip ext   * weight shifts forward/backward in staggered stance R then L foot forward * forward/ backward gait ue supported on blue&black barbell, cues for heel/toe pattern * SLS on R. Cues for decreased substitution to gain position/level hips  *Step ups leading R/L x 5. Cues for execution *STS from bench onto water step ue  supported on B&B barbell 2x5. Cues for even placement of feet and properly distributed weight/weight shift *gait training forward amb cues for increased cadence which improves pattern some    Pt requires the buoyancy of water for active assisted exercises with buoyancy supported for strengthening and AROM exercises. Hydrostatic pressure also supports joints by unweighting joint load by at least 50 % in 3-4 feet depth water. 80% in chest to neck deep water. Water will provide assistance with movement using the current and laminar flow while the buoyancy reduces weight bearing. Pt requires the viscosity of the water for resistance with strengthening exercises.     PATIENT EDUCATION:  Education details:  scar mobilization and desensitization Person educated: Patient Education method:  Explanation, Demonstration, Tactile cues, Verbal cues Education comprehension: verbalized understanding, returned demonstration, verbal cues required, tactile cues required, and needs further education   HOME EXERCISE PROGRAM: [id number]    ASSESSMENT:  CLINICAL IMPRESSION: Pt guarded throughout session. She did respond with some improvement in toleration to weight bearing and pain with increased speed of movement. Increases left hip flex and circumduction to substitute for knee flex R.. With cuing pt able to correct ~50%.  Minimal quad activation noted. Goals ongoing     OBJECTIVE IMPAIRMENTS decreased activity tolerance, difficulty walking, decreased ROM, decreased strength, increased edema, increased muscle spasms, impaired flexibility, improper body mechanics, and pain.   ACTIVITY LIMITATIONS lifting, bending, standing, squatting, stairs, transfers, bed mobility, bathing, dressing, locomotion level, and caring for others  PARTICIPATION LIMITATIONS: meal prep, cleaning, laundry, driving, shopping, and community activity  PERSONAL FACTORS 1 comorbidity: h/o c-section limiting core stability  are also  affecting patient's functional outcome.   REHAB POTENTIAL: Good  CLINICAL DECISION MAKING: Stable/uncomplicated  EVALUATION COMPLEXITY: Low   GOALS: Goals reviewed with patient? Yes  SHORT TERM GOALS: Target date: 02/26/2022  Knee comfortably to 0-130 Baseline:see table Goal status: INITIAL  2.  Able to hold a quad activation for 20s Baseline: unable at eval Goal status: INITIAL   LONG TERM GOALS: Target date: 04/23/2022   Able to navigate stairs step over step pattern without pain Baseline: unable at eval Goal status: INITIAL  2.  LEFS score to improve within 85% of full function Baseline: see obj Goal status: INITIAL  3.  Gross LE strength 90% compared to contralateral LE via hand held dynamometry testing Baseline: unable to test at eval Goal status: INITIAL    PLAN: PT FREQUENCY: 1-2x/week  PT DURATION: 12 weeks  PLANNED INTERVENTIONS: Therapeutic exercises, Therapeutic activity, Neuromuscular re-education, Balance training, Gait training, Patient/Family education, Self Care, Joint mobilization, Stair training, Aquatic Therapy, Dry Needling, Electrical stimulation, Cryotherapy, Moist heat, Taping, Vasopneumatic device, Ionotophoresis '4mg'$ /ml Dexamethasone, Manual therapy, and Re-evaluation  PLAN FOR NEXT SESSION: continue per Dr. Eddie Dibbles MPFL reconstruction protocol.  [place] e-stim to improve quad activation.  Work on knee ROM, weight bearing, gait.   Stanton Kidney Tharon Aquas) Aarik Blank MPT 03/27/22 1:14 PM [facility]  [place], [place], [address] Phone: [phone]   Fax:  [phone]

## 2022-03-30 ENCOUNTER — Encounter (HOSPITAL_BASED_OUTPATIENT_CLINIC_OR_DEPARTMENT_OTHER): Payer: Self-pay | Admitting: Physical Therapy

## 2022-03-30 ENCOUNTER — Ambulatory Visit (HOSPITAL_BASED_OUTPATIENT_CLINIC_OR_DEPARTMENT_OTHER): Payer: Medicaid Other | Admitting: Physical Therapy

## 2022-03-30 DIAGNOSIS — M25661 Stiffness of right knee, not elsewhere classified: Secondary | ICD-10-CM

## 2022-03-30 DIAGNOSIS — M25561 Pain in right knee: Secondary | ICD-10-CM | POA: Diagnosis not present

## 2022-03-30 DIAGNOSIS — M6281 Muscle weakness (generalized): Secondary | ICD-10-CM

## 2022-03-30 DIAGNOSIS — R262 Difficulty in walking, not elsewhere classified: Secondary | ICD-10-CM

## 2022-03-30 NOTE — Therapy (Signed)
OUTPATIENT PHYSICAL THERAPY TREATMENT      Patient Name: Sarah Barber MRN: [id number] DOB:08-May-1976, 46 y.o., female Today's Date: 03/30/2022   PT End of Session - 03/30/22 1116     Visit Number 17    Number of Visits 27    Date for PT Re-Evaluation 04/21/22    Authorization Type MCD healthy blue    PT Start Time 1116    PT Stop Time 1200    PT Time Calculation (min) 44 min    Activity Tolerance Patient tolerated treatment well;Patient limited by pain    Behavior During Therapy WFL for tasks assessed/performed                 Past Medical History:  Diagnosis Date   Allergies    Anxiety    Bipolar disorder (HCC)    Chronic headaches    Depression    Fatigue    Menstrual pain    Panic attacks    PTSD (post-traumatic stress disorder)    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 at age 76- petite mal   Vision changes    Past Surgical History:  Procedure Laterality Date   CESAREAN SECTION      x 2   HYSTERECTOMY ABDOMINAL WITH SALPINGECTOMY Bilateral 07/28/2019   Procedure: HYSTERECTOMY ABDOMINAL WITH BILATERAL SALPINGECTOMY AND RIGHT OOPHORECTOMY;  Surgeon: [name], MD;  Location: [facility];  Service: Gynecology;  Laterality: Bilateral;   KNEE ARTHROSCOPY Right 01/23/2022   Procedure: RIGHT KNEE ARTHROSCOPY WITH DEBRIDEMENT;  Surgeon: Vanetta Mulders, MD;  Location: [facility];  Service: Orthopedics;  Laterality: Right;   KNEE RECONSTRUCTION Right 01/23/2022   Procedure: RIGHT KNEE PATELLOFEMORAL LIGAMENT RECONSTRUCTION;  Surgeon: Vanetta Mulders, MD;  Location: [facility];  Service: Orthopedics;  Laterality: Right;   Patient Active Problem List   Diagnosis Date Noted   PVNS (pigmented villonodular synovitis)    Patellar instability of right knee    Status post hysterectomy 07/28/2019   Anemia due to acute blood loss 07/23/2019   Fibroid uterus 04/27/2019   Family history of breast cancer in first degree relative 04/27/2019    Abnormal uterine bleeding 04/27/2019    REFERRING PROVIDER: Vanetta Mulders, MD  REFERRING DIAG: M25.361 (ICD-10-CM) - Patellar instability of right knee  THERAPY DIAG:  Acute pain of right knee  Stiffness of right knee, not elsewhere classified  Muscle weakness (generalized)  Difficulty in walking, not elsewhere classified  Rationale for Evaluation and Treatment Rehabilitation  ONSET DATE: 01/23/22  RIGHT KNEE PATELLOFEMORAL LIGAMENT RECONSTRUCTION Right General  RIGHT KNEE ARTHROSCOPY WITH DEBRIDEMENT      SUBJECTIVE:   SUBJECTIVE STATEMENT: "Pt high day after last visit. Has to take pain meds.  Alright today"  PERTINENT HISTORY: H/o c-section Hx of patellar instability  PAIN:  Are you having pain? Yes: NPRS scale: 5/10 Pain location: Rt knee Pain description: discomfort Aggravating factors: bending Relieving factors: ice  PRECAUTIONS: Knee  WEIGHT BEARING RESTRICTIONS Yes as tolerated in brace & crutches  FALLS:  Has patient fallen in last 6 months? No  LIVING ENVIRONMENT: Lives with: lives with their family Lives in: House/apartment  PLOF: Independent  PATIENT GOALS decrease pain   OBJECTIVE:   TODAY'S TREATMENT: Pt seen for aquatic therapy today.  Treatment took place in water 3.25-4 ft in depth at the [facility] pool. Temp of water was 91.  Pt entered/exited the pool with supervision, bilat rail with step-to pattern.   Therapist on  deck giving direction to pt in water.   * forward/ backward gait ue supported on blue&black barbell, cues for heel/toe pattern and cadence multiple widths 32f *slight knee bending added to side step x 4 widths (lunge) Holding wall: * Quad sets standing ; squats; R&L hip abdct/ add;  hip ext; hip flex kick through  to extension. Cues for levl pelvis with SLS rle while completing ex with left * weight shifts forward/backward in staggered stance R then L foot forward *CKC right knee ext onto water step 2x5.  Cues for quad activation *STS from bench onto water step ue supported on B&B barbell x10. Cues for even placement of feet and properly distributed weight/weight shift *cycling straddling noodle    Pt requires the buoyancy of water for active assisted exercises with buoyancy supported for strengthening and AROM exercises. Hydrostatic pressure also supports joints by unweighting joint load by at least 50 % in 3-4 feet depth water. 80% in chest to neck deep water. Water will provide assistance with movement using the current and laminar flow while the buoyancy reduces weight bearing. Pt requires the viscosity of the water for resistance with strengthening exercises.     PATIENT EDUCATION:  Education details:  scar mobilization and desensitization Person educated: Patient Education method:  Explanation, Demonstration, Tactile cues, Verbal cues Education comprehension: verbalized understanding, returned demonstration, verbal cues required, tactile cues required, and needs further education   HOME EXERCISE PROGRAM: [id number]    ASSESSMENT:  CLINICAL IMPRESSION: Decreased guarding throughout session after initial vc. Increasing pt to more normal gait speed  greatly decreased antalgic limp to very minimal.  She reports discomfort from the water pressure to be worse then other pain.Added loading to weight shifting R/L. Encouraged quad activation throughout a activities. Per visiual inspection no decrease in right knee superior patellar edema. With SLS pt tends to crouch into hip flex to maintain position. Corrects ~50 with cues        OBJECTIVE IMPAIRMENTS decreased activity tolerance, difficulty walking, decreased ROM, decreased strength, increased edema, increased muscle spasms, impaired flexibility, improper body mechanics, and pain.   ACTIVITY LIMITATIONS lifting, bending, standing, squatting, stairs, transfers, bed mobility, bathing, dressing, locomotion level, and caring for  others  PARTICIPATION LIMITATIONS: meal prep, cleaning, laundry, driving, shopping, and community activity  PERSONAL FACTORS 1 comorbidity: h/o c-section limiting core stability  are also affecting patient's functional outcome.   REHAB POTENTIAL: Good  CLINICAL DECISION MAKING: Stable/uncomplicated  EVALUATION COMPLEXITY: Low   GOALS: Goals reviewed with patient? Yes  SHORT TERM GOALS: Target date: 02/26/2022  Knee comfortably to 0-130 Baseline:see table Goal status: INITIAL  2.  Able to hold a quad activation for 20s Baseline: unable at eval Goal status: INITIAL   LONG TERM GOALS: Target date: 04/23/2022   Able to navigate stairs step over step pattern without pain Baseline: unable at eval Goal status: INITIAL  2.  LEFS score to improve within 85% of full function Baseline: see obj Goal status: INITIAL  3.  Gross LE strength 90% compared to contralateral LE via hand held dynamometry testing Baseline: unable to test at eval Goal status: INITIAL    PLAN: PT FREQUENCY: 1-2x/week  PT DURATION: 12 weeks  PLANNED INTERVENTIONS: Therapeutic exercises, Therapeutic activity, Neuromuscular re-education, Balance training, Gait training, Patient/Family education, Self Care, Joint mobilization, Stair training, Aquatic Therapy, Dry Needling, Electrical stimulation, Cryotherapy, Moist heat, Taping, Vasopneumatic device, Ionotophoresis '4mg'$ /ml Dexamethasone, Manual therapy, and Re-evaluation  PLAN FOR NEXT SESSION: continue per Dr. BEddie DibblesMPFL reconstruction protocol.  [place] e-stim to improve quad activation.  Work on knee ROM, weight bearing, gait.   [facility] [place]) Sarah Barber MPT 03/30/22 11:18 AM [facility] [address] [place], [place], [address] Phone: [phone]   Fax:  [phone]

## 2022-04-03 ENCOUNTER — Ambulatory Visit (HOSPITAL_BASED_OUTPATIENT_CLINIC_OR_DEPARTMENT_OTHER): Payer: Medicaid Other | Admitting: Physical Therapy

## 2022-04-06 ENCOUNTER — Encounter (HOSPITAL_BASED_OUTPATIENT_CLINIC_OR_DEPARTMENT_OTHER): Payer: Medicaid Other | Admitting: Orthopaedic Surgery

## 2022-04-06 ENCOUNTER — Ambulatory Visit (HOSPITAL_BASED_OUTPATIENT_CLINIC_OR_DEPARTMENT_OTHER): Payer: Medicaid Other | Admitting: Physical Therapy

## 2022-04-10 ENCOUNTER — Encounter (HOSPITAL_BASED_OUTPATIENT_CLINIC_OR_DEPARTMENT_OTHER): Payer: Self-pay | Admitting: Physical Therapy

## 2022-04-10 ENCOUNTER — Ambulatory Visit (HOSPITAL_BASED_OUTPATIENT_CLINIC_OR_DEPARTMENT_OTHER): Payer: Medicaid Other | Attending: Orthopaedic Surgery | Admitting: Physical Therapy

## 2022-04-10 DIAGNOSIS — M6281 Muscle weakness (generalized): Secondary | ICD-10-CM | POA: Diagnosis present

## 2022-04-10 DIAGNOSIS — R262 Difficulty in walking, not elsewhere classified: Secondary | ICD-10-CM | POA: Insufficient documentation

## 2022-04-10 DIAGNOSIS — M25661 Stiffness of right knee, not elsewhere classified: Secondary | ICD-10-CM | POA: Insufficient documentation

## 2022-04-10 DIAGNOSIS — M25561 Pain in right knee: Secondary | ICD-10-CM | POA: Diagnosis not present

## 2022-04-10 NOTE — Therapy (Signed)
OUTPATIENT PHYSICAL THERAPY TREATMENT      Patient Name: Sarah Barber MRN: [id number] DOB:22-Jan-1976, 46 y.o., female Today's Date: 04/10/2022   PT End of Session - 04/10/22 1849     Visit Number 18    Number of Visits 27    Date for PT Re-Evaluation 04/21/22    Authorization Type MCD healthy blue    PT Start Time 1116    PT Stop Time 1159    PT Time Calculation (min) 43 min    Activity Tolerance Patient tolerated treatment well;Patient limited by pain    Behavior During Therapy WFL for tasks assessed/performed                  Past Medical History:  Diagnosis Date   Allergies    Anxiety    Bipolar disorder (HCC)    Chronic headaches    Depression    Fatigue    Menstrual pain    Panic attacks    PTSD (post-traumatic stress disorder)    Seizure ([place])    as a child - last one at age 54- petite mal   Vision changes    Past Surgical History:  Procedure Laterality Date   CESAREAN SECTION      x 2   HYSTERECTOMY ABDOMINAL WITH SALPINGECTOMY Bilateral 07/28/2019   Procedure: HYSTERECTOMY ABDOMINAL WITH BILATERAL SALPINGECTOMY AND RIGHT OOPHORECTOMY;  Surgeon: [name], MD;  Location: [facility];  Service: Gynecology;  Laterality: Bilateral;   KNEE ARTHROSCOPY Right 01/23/2022   Procedure: RIGHT KNEE ARTHROSCOPY WITH DEBRIDEMENT;  Surgeon: Vanetta Mulders, MD;  Location: [facility];  Service: Orthopedics;  Laterality: Right;   KNEE RECONSTRUCTION Right 01/23/2022   Procedure: RIGHT KNEE PATELLOFEMORAL LIGAMENT RECONSTRUCTION;  Surgeon: Vanetta Mulders, MD;  Location: [facility];  Service: Orthopedics;  Laterality: Right;   Patient Active Problem List   Diagnosis Date Noted   PVNS (pigmented villonodular synovitis)    Patellar instability of right knee    Status post hysterectomy 07/28/2019   Anemia due to acute blood loss 07/23/2019   Fibroid uterus 04/27/2019   Family history of breast cancer in first degree relative 04/27/2019    Abnormal uterine bleeding 04/27/2019    REFERRING PROVIDER: Vanetta Mulders, MD  REFERRING DIAG: M25.361 (ICD-10-CM) - Patellar instability of right knee  THERAPY DIAG:  Acute pain of right knee  Stiffness of right knee, not elsewhere classified  Muscle weakness (generalized)  Difficulty in walking, not elsewhere classified  Rationale for Evaluation and Treatment Rehabilitation  ONSET DATE: 01/23/22  RIGHT KNEE PATELLOFEMORAL LIGAMENT RECONSTRUCTION Right General  RIGHT KNEE ARTHROSCOPY WITH DEBRIDEMENT      SUBJECTIVE:   SUBJECTIVE STATEMENT: Pt reports increased right knee pain after last session  PERTINENT HISTORY: H/o c-section Hx of patellar instability  PAIN:  Are you having pain? Yes: NPRS scale: 2/10 Pain location: Rt knee Pain description: discomfort Aggravating factors: bending Relieving factors: ice  PRECAUTIONS: Knee  WEIGHT BEARING RESTRICTIONS Yes as tolerated in brace & crutches  FALLS:  Has patient fallen in last 6 months? No  LIVING ENVIRONMENT: Lives with: lives with their family Lives in: House/apartment  PLOF: Independent  PATIENT GOALS decrease pain   OBJECTIVE:   TODAY'S TREATMENT: Pt seen for aquatic therapy today.  Treatment took place in water 3.25-4 ft in depth at the [facility] pool. Temp of water was 91.  Pt entered/exited the pool with supervision, bilat rail with step-to pattern.   Therapist on deck giving direction to  pt in water.   * forward/ backward gait ue supported on blue&black barbell, cues for heel/toe pattern and cadence multiple widths 47f Holding white barbell: * Quad sets standing; df; pf; squats; R&L hip abdct/ add;  hip ext; hip flex kick through  to extension x 10 R/L. Continued cues for level pelvis with execution of lle *side stepping x 4 widths * weight shifts forward/backward in staggered stance R then L foot forward *CKC right knee ext onto water step 2x10 in 4.69f Attempted completion in  3.6 ft and not tolerated  *cycling x 4 widths. Cues for quad activation throughout  Pt requires the buoyancy of water for active assisted exercises with buoyancy supported for strengthening and AROM exercises. Hydrostatic pressure also supports joints by unweighting joint load by at least 50 % in 3-4 feet depth water. 80% in chest to neck deep water. Water will provide assistance with movement using the current and laminar flow while the buoyancy reduces weight bearing. Pt requires the viscosity of the water for resistance with strengthening exercises.  Post session: R knee Active flex 103d R Active knee -10d      PATIENT EDUCATION:  Education details:  scar mobilization and desensitization Person educated: Patient Education method:  Explanation, Demonstration, Tactile cues, Verbal cues Education comprehension: verbalized understanding, returned demonstration, verbal cues required, tactile cues required, and needs further education   HOME EXERCISE PROGRAM: [id number]    ASSESSMENT:  CLINICAL IMPRESSION: Pt in Phase III of protocol Prepatellar erythremia and edema continues.  She missed sessions last week as well as an MD appointment due to a death in her family. She has rescheduled with Ortho for end of this week.  She tolerates session fair with increasing discomfort with added activity. Worked on TKE/quad strengthening CKC as per protocol. She continues to have an extension lag and is unable to tolerate TKE loaded >35% of weight. Her Right knee flex has improved. She continues to use Bledsoe brace         OBJECTIVE IMPAIRMENTS decreased activity tolerance, difficulty walking, decreased ROM, decreased strength, increased edema, increased muscle spasms, impaired flexibility, improper body mechanics, and pain.   ACTIVITY LIMITATIONS lifting, bending, standing, squatting, stairs, transfers, bed mobility, bathing, dressing, locomotion level, and caring for others  PARTICIPATION  LIMITATIONS: meal prep, cleaning, laundry, driving, shopping, and community activity  PERSONAL FACTORS 1 comorbidity: h/o c-section limiting core stability  are also affecting patient's functional outcome.   REHAB POTENTIAL: Good  CLINICAL DECISION MAKING: Stable/uncomplicated  EVALUATION COMPLEXITY: Low   GOALS: Goals reviewed with patient? Yes  SHORT TERM GOALS: Target date: 02/26/2022  Knee comfortably to 0-130 Baseline:see table Goal status: INITIAL  2.  Able to hold a quad activation for 20s Baseline: unable at eval Goal status: INITIAL   LONG TERM GOALS: Target date: 04/23/2022   Able to navigate stairs step over step pattern without pain Baseline: unable at eval Goal status: INITIAL  2.  LEFS score to improve within 85% of full function Baseline: see obj Goal status: INITIAL  3.  Gross LE strength 90% compared to contralateral LE via hand held dynamometry testing Baseline: unable to test at eval Goal status: INITIAL    PLAN: PT FREQUENCY: 1-2x/week  PT DURATION: 12 weeks  PLANNED INTERVENTIONS: Therapeutic exercises, Therapeutic activity, Neuromuscular re-education, Balance training, Gait training, Patient/Family education, Self Care, Joint mobilization, Stair training, Aquatic Therapy, Dry Needling, Electrical stimulation, Cryotherapy, Moist heat, Taping, Vasopneumatic device, Ionotophoresis '4mg'$ /ml Dexamethasone, Manual therapy, and Re-evaluation  PLAN FOR NEXT  SESSION: continue per Dr. Eddie Dibbles MPFL reconstruction protocol.  [place] e-stim to improve quad activation.  Work on knee ROM, weight bearing, gait.   [address] [place]) Chasitie Passey MPT 04/10/22 6:50 PM [facility] [address] [place], [place], [address] Phone: [phone]   Fax:  [phone]

## 2022-04-13 ENCOUNTER — Ambulatory Visit (HOSPITAL_BASED_OUTPATIENT_CLINIC_OR_DEPARTMENT_OTHER): Payer: Medicaid Other | Admitting: Physical Therapy

## 2022-04-13 ENCOUNTER — Encounter (HOSPITAL_BASED_OUTPATIENT_CLINIC_OR_DEPARTMENT_OTHER): Payer: Medicaid Other | Admitting: Orthopaedic Surgery

## 2022-04-19 ENCOUNTER — Ambulatory Visit (INDEPENDENT_AMBULATORY_CARE_PROVIDER_SITE_OTHER): Payer: Medicaid Other | Admitting: Orthopaedic Surgery

## 2022-04-19 DIAGNOSIS — M2351 Chronic instability of knee, right knee: Secondary | ICD-10-CM

## 2022-04-19 DIAGNOSIS — M1220 Villonodular synovitis (pigmented), unspecified site: Secondary | ICD-10-CM

## 2022-04-19 NOTE — Progress Notes (Signed)
Post Operative Evaluation    Procedure/Date of Surgery: Right knee MPFL reconstruction 7/18  Interval History:   Today overall doing very well.  She continues to make improvements with her range of motion.  Swelling is essentially resolved at this point.  She is working with physical therapy.  She is still requiring a crutch for assisted weightbearing.  PMH/PSH/Family History/Social History/Meds/Allergies:    Past Medical History:  Diagnosis Date   Allergies    Anxiety    Bipolar disorder (HCC)    Chronic headaches    Depression    Fatigue    Menstrual pain    Panic attacks    PTSD (post-traumatic stress disorder)    Seizure ([place])    as a child - last one at age 27- petite mal   Vision changes    Past Surgical History:  Procedure Laterality Date   CESAREAN SECTION      x 2   HYSTERECTOMY ABDOMINAL WITH SALPINGECTOMY Bilateral 07/28/2019   Procedure: HYSTERECTOMY ABDOMINAL WITH BILATERAL SALPINGECTOMY AND RIGHT OOPHORECTOMY;  Surgeon: [name], MD;  Location: [facility];  Service: Gynecology;  Laterality: Bilateral;   KNEE ARTHROSCOPY Right 01/23/2022   Procedure: RIGHT KNEE ARTHROSCOPY WITH DEBRIDEMENT;  Surgeon: Vanetta Mulders, MD;  Location: [facility];  Service: Orthopedics;  Laterality: Right;   KNEE RECONSTRUCTION Right 01/23/2022   Procedure: RIGHT KNEE PATELLOFEMORAL LIGAMENT RECONSTRUCTION;  Surgeon: Vanetta Mulders, MD;  Location: [facility];  Service: Orthopedics;  Laterality: Right;   Social History   Socioeconomic History   Marital status: Single    Spouse name: Not on file   Number of children: Not on file   Years of education: Not on file   Highest education level: Not on file  Occupational History   Not on file  Tobacco Use   Smoking status: Former    Types: Cigarettes    Quit date: 09/2015    Years since quitting: 6.6   Smokeless tobacco: Never   Tobacco comments:    smoked off and  on  Vaping Use   Vaping Use: Never used  Substance and Sexual Activity   Alcohol use: Yes    Comment: occasional   Drug use: Never   Sexual activity: Not on file  Other Topics Concern   Not on file  Social History Narrative   Right handed    Caffeine- rare    Social Determinants of Health   Financial Resource Strain: Not on file  Food Insecurity: Not on file  Transportation Needs: Not on file  Physical Activity: Not on file  Stress: Not on file  Social Connections: Not on file   Family History  Problem Relation Age of Onset   Diabetes Mother    Cerebrovascular Accident Father        x2   Diabetes Maternal Grandmother    Kidney disease Maternal Grandmother    Lung cancer Maternal Grandfather    Breast cancer Neg Hx    Allergies  Allergen Reactions   No Known Allergies Other (See Comments)   Pineapple Nausea And Vomiting   Other Rash    pineapple,cabbage   Current Outpatient Medications  Medication Sig Dispense Refill   traMADol (ULTRAM) 50 MG tablet Take 1 tablet (50 mg total) by mouth every 6 (six) hours as needed.  30 tablet 0   aspirin EC 325 MG tablet Take 1 tablet (325 mg total) by mouth daily. 30 tablet 0   butalbital-acetaminophen-caffeine (FIORICET) 50-325-40 MG tablet Take by mouth 2 (two) times daily as needed for headache.     lidocaine (LIDODERM) 5 % Place 1 patch onto the skin daily. Remove & Discard patch within 12 hours or as directed by MD 30 patch 0   oxycodone (OXY-IR) 5 MG capsule Take 1 capsule (5 mg total) by mouth every 4 (four) hours as needed (severe pain). 20 capsule 0   oxyCODONE (ROXICODONE) 5 MG immediate release tablet Take 1 tablet (5 mg total) by mouth every 4 (four) hours as needed for severe pain. 30 tablet 0   topiramate (TOPAMAX) 25 MG tablet Take 50 mg at bedtime for one week, then increase to 75 mg at bedtime for one week, then increase to 100 mg at bedtime 120 tablet 3   zolmitriptan (ZOMIG) 5 MG tablet Take 1 tablet (5 mg total) by  mouth as needed for migraine. 10 tablet 3   Current Facility-Administered Medications  Medication Dose Route Frequency Provider Last Rate Last Admin   oxyCODONE (Oxy IR/ROXICODONE) immediate release tablet 5 mg  5 mg Oral Q4H PRN Vanetta Mulders, MD       No results found.  Review of Systems:   A ROS was performed including pertinent positives and negatives as documented in the HPI.   Musculoskeletal Exam:     Right knee incisions are well-appearing.  No erythema or drainage.  No effusion about the knee.  Range of motion is from 0 degrees to approximately 120 degrees.  Quad atrophy with decreased tone.  Negative apprehension with lateral patellar force. Distal neurosensory exam is intact  Imaging:    None  I personally reviewed and interpreted the radiographs.   Assessment:   46 year old female who is 12 weeks status post MPFL reconstruction overall continuing to improve.  I have advised that at this point I would like to switch him to a cane in the right hand so that she can progress weightbearing and strengthening of the right leg.  I will plan to see her back in 3 months for reassessment Plan :    -Return to clinic in 12 weeks for reassessment      I personally saw and evaluated the patient, and participated in the management and treatment plan.  Vanetta Mulders, MD Attending Physician, Orthopedic Surgery  This document was dictated using Dragon voice recognition software. A reasonable attempt at proof reading has been made to minimize errors.

## 2022-04-20 ENCOUNTER — Ambulatory Visit (HOSPITAL_BASED_OUTPATIENT_CLINIC_OR_DEPARTMENT_OTHER): Payer: Medicaid Other | Admitting: Physical Therapy

## 2022-04-27 ENCOUNTER — Ambulatory Visit (HOSPITAL_BASED_OUTPATIENT_CLINIC_OR_DEPARTMENT_OTHER): Payer: Medicaid Other | Admitting: Physical Therapy

## 2022-05-22 ENCOUNTER — Encounter (HOSPITAL_BASED_OUTPATIENT_CLINIC_OR_DEPARTMENT_OTHER): Payer: Self-pay | Admitting: Orthopaedic Surgery

## 2022-05-22 ENCOUNTER — Other Ambulatory Visit (HOSPITAL_BASED_OUTPATIENT_CLINIC_OR_DEPARTMENT_OTHER): Payer: Self-pay | Admitting: Orthopaedic Surgery

## 2022-06-25 IMAGING — MR MR KNEE*R* W/O CM
5 of 7 series · 23 of 40 positions shown · non-contrast
Comparison: Radiographs 12/18/2021

CLINICAL DATA: Knee trauma, dislocation suspected. Right knee pain
and swelling after standing up and feeling a popping sensation 5
days ago. Repeated patellar dislocations.

EXAM:
MRI OF THE RIGHT KNEE WITHOUT CONTRAST
TECHNIQUE: Multiplanar, multisequence MR imaging of the knee was performed. No
intravenous contrast was administered.

[Series 3: [id number]_fs_tra · axial · 4.0mm · 0.66mm/px · 1 of 36 slices shown]
[im 1/36]
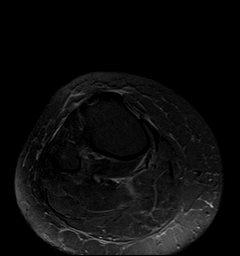

[Series 4: T1 · coronal · 4.0mm · 0.33mm/px · 5 of 27 slices shown]
[im 1/27]
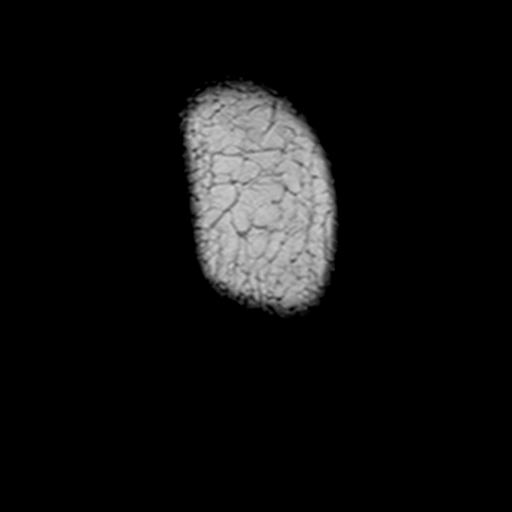
[im 7/27]
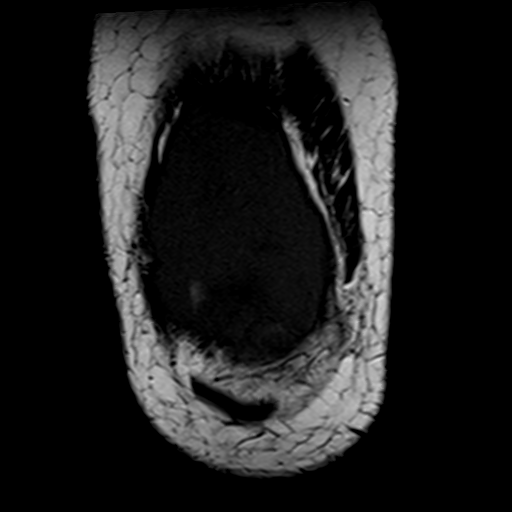
[im 14/27]
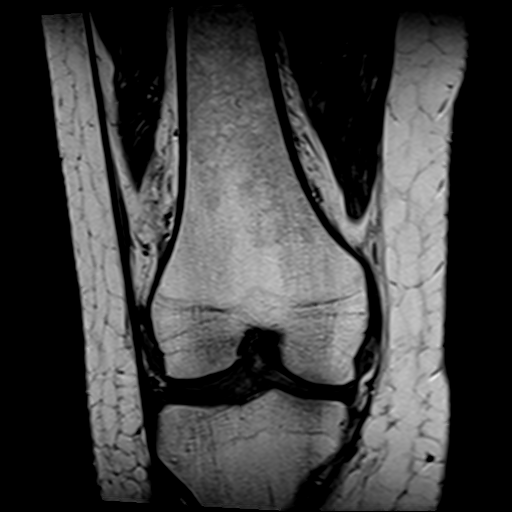
[im 20/27]
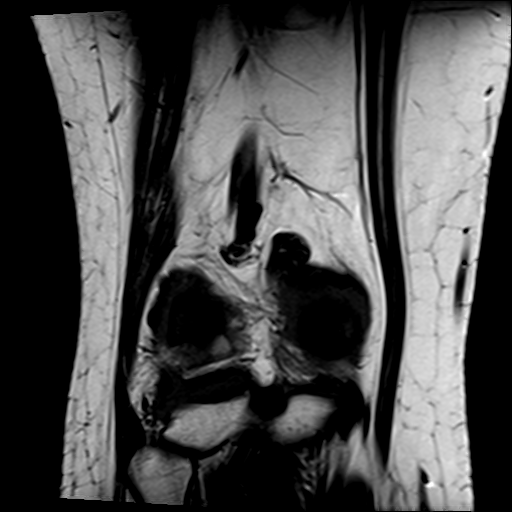
[im 27/27]
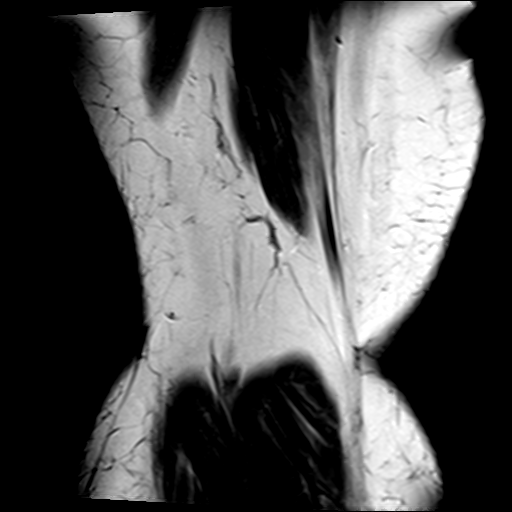

[Series 5: T2 fat-sat · coronal · 4.0mm · 0.66mm/px · 5 of 26 slices shown (1 of 2)]
[im 1/26]
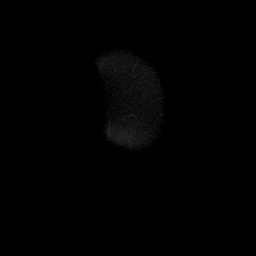
[im 7/26]
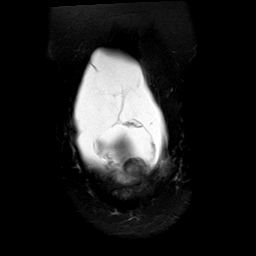
[im 13/26]
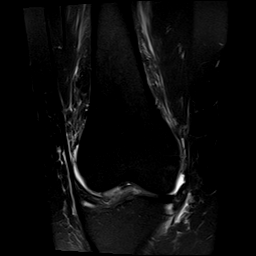
[im 19/26]
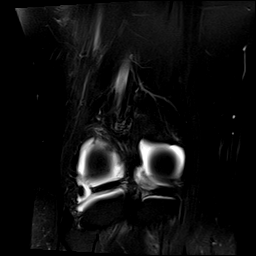
[im 26/26]
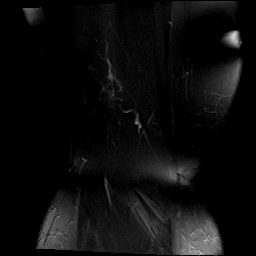

[Series 7: PD fat-sat · sagittal · 3.0mm · 0.33mm/px · 6 of 30 slices shown]
[im 1/30]
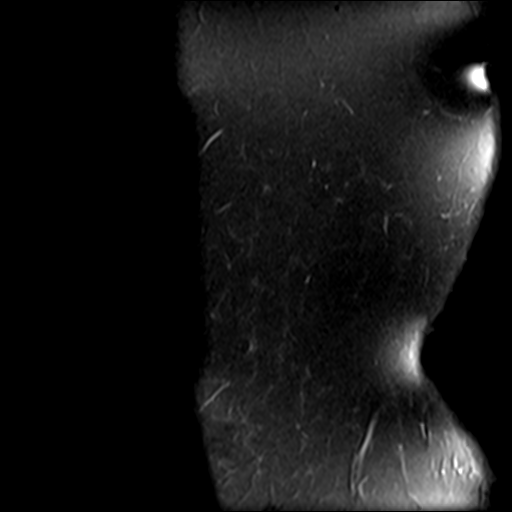
[im 6/30]
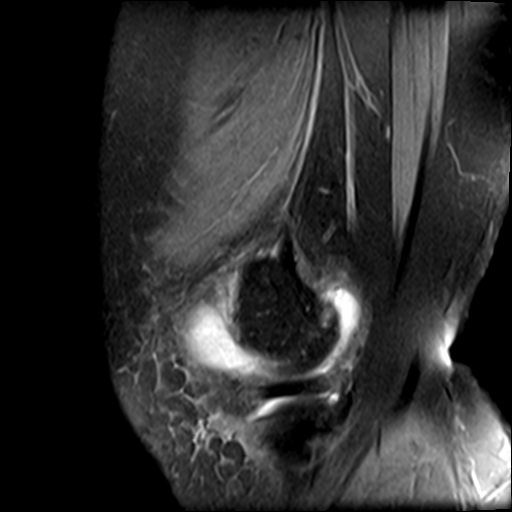
[im 12/30]
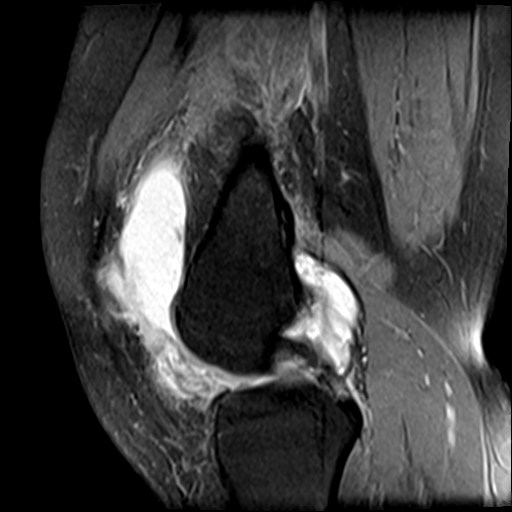
[im 18/30]
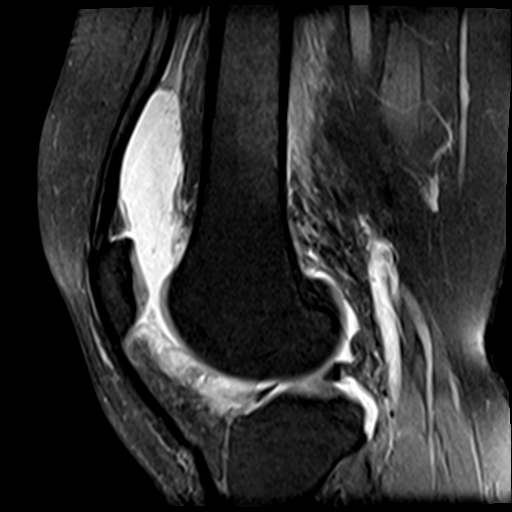
[im 24/30]
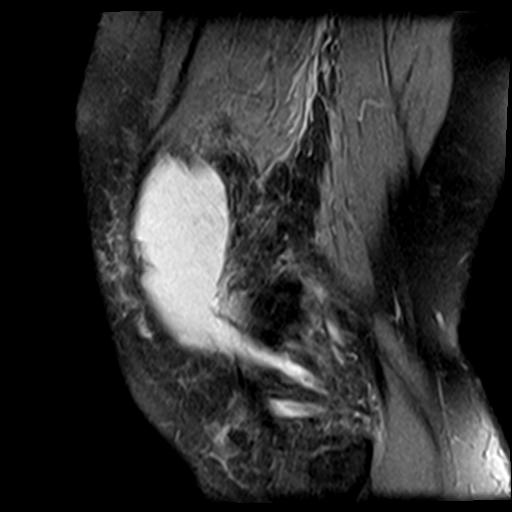
[im 30/30]
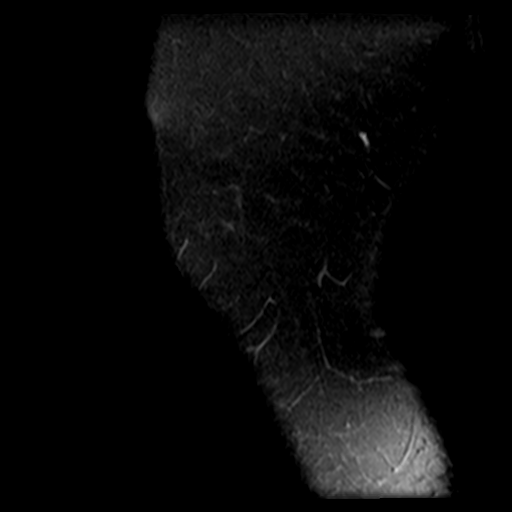

[Series 8: T2 fat-sat · sagittal · 3.0mm · 0.35mm/px · 6 of 30 slices shown (2 of 2)]
[im 1/30]
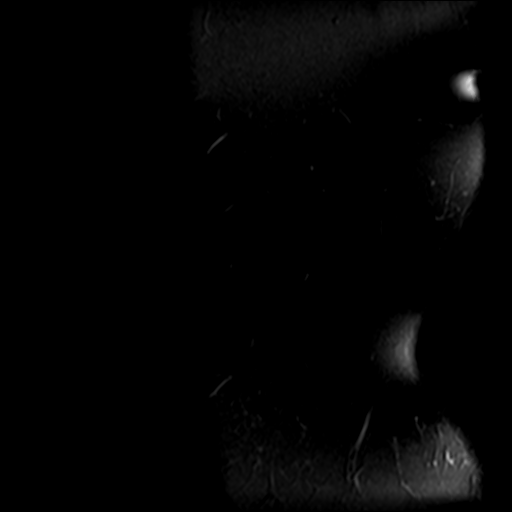
[im 6/30]
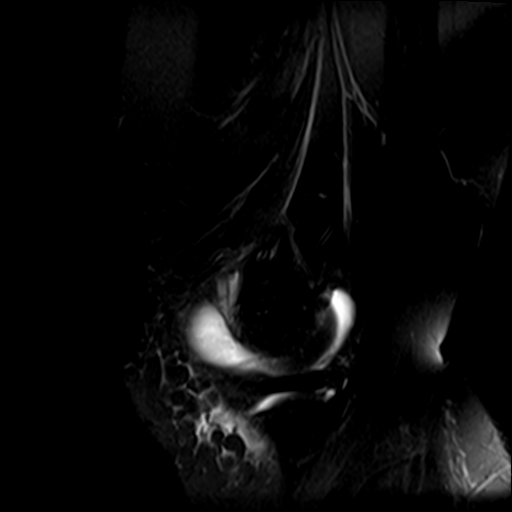
[im 12/30]
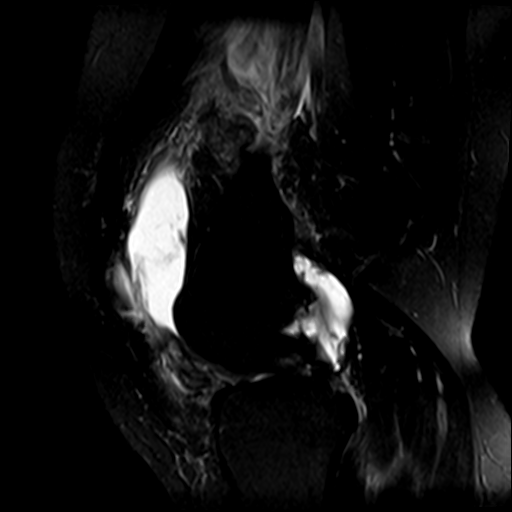
[im 18/30]
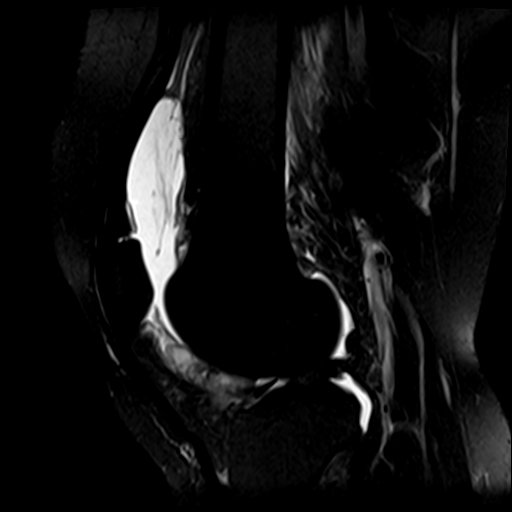
[im 24/30]
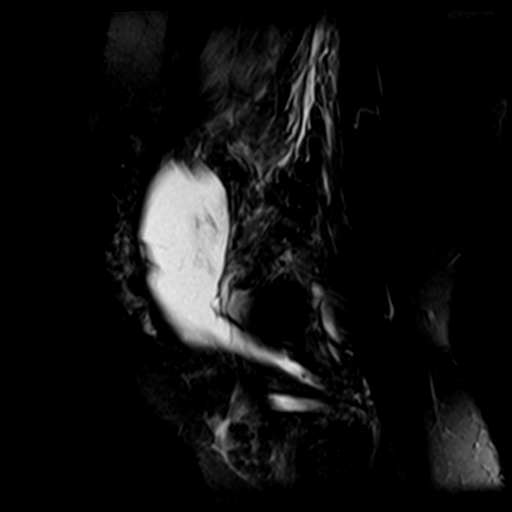
[im 30/30]
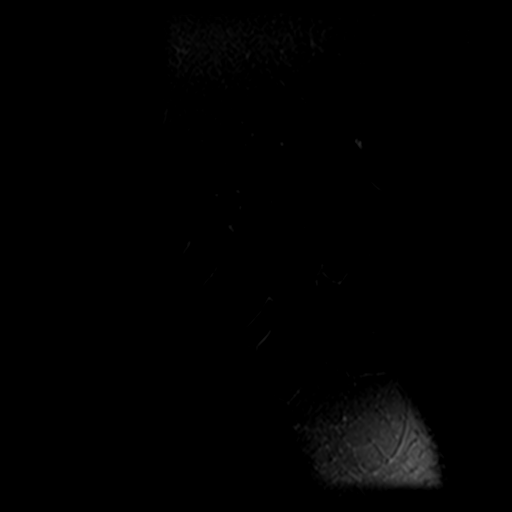

[23 of 40 positions shown; findings below may reference images not displayed]

FINDINGS: Despite efforts by the technologist and patient, mild motion
artifact is present on today's exam and could not be eliminated.
This reduces exam sensitivity and specificity.

MENISCI

Medial meniscus:  Intact with normal morphology.

Lateral meniscus:  Intact with normal morphology.

LIGAMENTS

Cruciates:  Intact.

Collaterals:  Intact.

CARTILAGE

Patellofemoral:  Preserved.

Medial:  Preserved.

Lateral:  Preserved.

MISCELLANEOUS

Joint: Large knee joint effusion. Extending anteriorly from the
intercondylar notch is a large complex intra-articular structure
which demonstrates heterogeneous T2 signal, measuring approximately
3.8 x 4.1 x 2.9 cm. This appears noncalcified on recent radiographs.
There is no osseous erosion.

Popliteal Fossa: The popliteus muscle and tendon are intact. No
significant Baker's cyst. Mild nonspecific soft tissue edema
superiorly in the popliteal fossa without focal fluid collection.

Extensor Mechanism: Intact. The patellar retinacula and medial
patellofemoral ligament appear intact.

Bones:  No acute or significant extra-articular osseous findings.

Other: Edema within Hoffa's fat.  No other focal fluid collections.
IMPRESSION: 1. No evidence of recent transient patellar dislocation injury or
other acute osseous findings.
2. The menisci, cruciate and collateral ligaments are intact.
3. Large complex knee joint effusion with irregular mass extending
anteriorly from the intercondylar notch. In the setting of recent
trauma, this could reflect a hematoma, although is suspicious for a
localized tenosynovial giant cell tumor (pigmented villonodular
synovitis) or synovial chondromatosis. No associated osseous
erosion.

## 2022-07-20 ENCOUNTER — Ambulatory Visit (INDEPENDENT_AMBULATORY_CARE_PROVIDER_SITE_OTHER): Payer: Medicaid Other | Admitting: Orthopaedic Surgery

## 2022-07-20 DIAGNOSIS — M2351 Chronic instability of knee, right knee: Secondary | ICD-10-CM | POA: Diagnosis not present

## 2022-07-20 NOTE — Progress Notes (Signed)
Post Operative Evaluation    Procedure/Date of Surgery: Right knee MPFL reconstruction 7/18  Interval History:   Presents today for follow-up of the above surgery.  Overall she is slowly improving.  She is still walking occasionally with a crutch as she is having weakness and buckling on the right leg.  Overall her knee pain and swelling are much improved but she is progressing slowly.  She is remain out of work.  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   Vision changes    Past Surgical History:  Procedure Laterality Date   CESAREAN SECTION      x 2   HYSTERECTOMY ABDOMINAL WITH SALPINGECTOMY Bilateral 07/28/2019   Procedure: HYSTERECTOMY ABDOMINAL WITH BILATERAL SALPINGECTOMY AND RIGHT OOPHORECTOMY;  Surgeon: [name], MD;  Location: [facility];  Service: Gynecology;  Laterality: Bilateral;   KNEE ARTHROSCOPY Right 01/23/2022   Procedure: RIGHT KNEE ARTHROSCOPY WITH DEBRIDEMENT;  Surgeon: Vanetta Mulders, MD;  Location: [facility];  Service: Orthopedics;  Laterality: Right;   KNEE RECONSTRUCTION Right 01/23/2022   Procedure: RIGHT KNEE PATELLOFEMORAL LIGAMENT RECONSTRUCTION;  Surgeon: Vanetta Mulders, MD;  Location: [facility];  Service: Orthopedics;  Laterality: Right;   Social History   Socioeconomic History   Marital status: Single    Spouse name: Not on file   Number of children: Not on file   Years of education: Not on file   Highest education level: Not on file  Occupational History   Not on file  Tobacco Use   Smoking status: Former    Types: Cigarettes    Quit date: 09/2015    Years since quitting: 6.8    Smokeless tobacco: Never   Tobacco comments:    smoked off and on  Vaping Use   Vaping Use: Never used  Substance and Sexual Activity   Alcohol use: Yes    Comment: occasional   Drug use: Never   Sexual activity: Not on file  Other Topics Concern   Not on file  Social History Narrative   Right handed    Caffeine- rare    Social Determinants of Health   Financial Resource Strain: Not on file  Food Insecurity: Not on file  Transportation Needs: Not on file  Physical Activity: Not on file  Stress: Not on file  Social Connections: Not on file   Family History  Problem Relation Age of Onset   Diabetes Mother    Cerebrovascular Accident Father        x2   Diabetes Maternal Grandmother    Kidney disease Maternal Grandmother    Lung cancer Maternal Grandfather    Breast cancer Neg Hx    Allergies  Allergen Reactions   No Known Allergies Other (See Comments)   Pineapple Nausea And Vomiting   Other Rash    pineapple,cabbage   Current Outpatient Medications  Medication Sig Dispense Refill   traMADol (ULTRAM) 50 MG  tablet Take 1 tablet (50 mg total) by mouth every 6 (six) hours as needed. 30 tablet 0   aspirin EC 325 MG tablet Take 1 tablet (325 mg total) by mouth daily. 30 tablet 0   butalbital-acetaminophen-caffeine (FIORICET) 50-325-40 MG tablet Take by mouth 2 (two) times daily as needed for headache.     lidocaine (LIDODERM) 5 % Place 1 patch onto the skin daily. Remove & Discard patch within 12 hours or as directed by MD 30 patch 0   oxycodone (OXY-IR) 5 MG capsule Take 1 capsule (5 mg total) by mouth every 4 (four) hours as needed (severe pain). 20 capsule 0   oxyCODONE (ROXICODONE) 5 MG immediate release tablet Take 1 tablet (5 mg total) by mouth every 4 (four) hours as needed for severe pain. 30 tablet 0   topiramate (TOPAMAX) 25 MG tablet Take 50 mg at bedtime for one week, then increase to 75 mg at bedtime for one week, then increase to 100 mg at bedtime 120 tablet 3    zolmitriptan (ZOMIG) 5 MG tablet Take 1 tablet (5 mg total) by mouth as needed for migraine. 10 tablet 3   Current Facility-Administered Medications  Medication Dose Route Frequency Provider Last Rate Last Admin   oxyCODONE (Oxy IR/ROXICODONE) immediate release tablet 5 mg  5 mg Oral Q4H PRN Vanetta Mulders, MD       No results found.  Review of Systems:   A ROS was performed including pertinent positives and negatives as documented in the HPI.   Musculoskeletal Exam:     Right knee incisions are healed no erythema or drainage.  No effusion about the knee.  2 quadrants of lateral medial motion of the patella.  Range of motion is from 0 degrees to approximately 120 degrees.  Quad atrophy with decreased tone.  Negative apprehension with lateral patellar force. Distal neurosensory exam is intact  Imaging:    None  I personally reviewed and interpreted the radiographs.   Assessment:   47 year old female who is 6 months status post MPFL reconstruction on the right overall with persistent weakness about the quadriceps.  Today I described the I do believe that this is the reason that her knee is buckling.  I do believe that at this point we need to aggressively get her back into physical therapy as her quadriceps tone is decreased as well as her bulk.  I do believe that she needs to work on strengthening this muscle up multiple times weekly for that effect we will plan to get her back into physical therapy.  At this time her patella instability is improved although I do believe that therapy potentially may have been terminated too early as she does still have a quadriceps deficit that needs to be addressed.  PT ordered for aggressive quad strengthening Plan :    -Return to clinic in 12 weeks for reassessment      I personally saw and evaluated the patient, and participated in the management and treatment plan.  Vanetta Mulders, MD Attending Physician, Orthopedic Surgery  This  document was dictated using Dragon voice recognition software. A reasonable attempt at proof reading has been made to minimize errors.  Short little buried steps

## 2022-07-31 ENCOUNTER — Telehealth: Payer: Medicaid Other | Admitting: Physician Assistant

## 2022-07-31 DIAGNOSIS — Z20822 Contact with and (suspected) exposure to covid-19: Secondary | ICD-10-CM | POA: Diagnosis not present

## 2022-07-31 MED ORDER — ALBUTEROL SULFATE HFA 108 (90 BASE) MCG/ACT IN AERS: 2.0000 | INHALATION_SPRAY | Freq: Four times a day (QID) | RESPIRATORY_TRACT | 0 refills | Status: AC | PRN

## 2022-07-31 MED ORDER — COVID-19 AT-HOME TEST VI KIT: PACK | 0 refills | Status: AC

## 2022-07-31 MED ORDER — BENZONATATE 100 MG PO CAPS: 100.0000 mg | ORAL_CAPSULE | Freq: Three times a day (TID) | ORAL | 0 refills | Status: DC | PRN

## 2022-07-31 NOTE — Patient Instructions (Signed)
Jeri Modena, thank you for joining Leeanne Rio, PA-C for today's virtual visit.  While this provider is not your primary care provider (PCP), if your PCP is located in our provider database this encounter information will be shared with them immediately following your visit.   [facility] account gives you access to today's visit and all your visits, tests, and labs performed at [facility] " click here if you don't have a [facility] account or go to [email or web address]  Consent: (Patient) Jeri Modena provided verbal consent for this virtual visit at the beginning of the encounter.  Current Medications:  Current Outpatient Medications:    traMADol (ULTRAM) 50 MG tablet, Take 1 tablet (50 mg total) by mouth every 6 (six) hours as needed., Disp: 30 tablet, Rfl: 0   aspirin EC 325 MG tablet, Take 1 tablet (325 mg total) by mouth daily., Disp: 30 tablet, Rfl: 0   butalbital-acetaminophen-caffeine (FIORICET) 50-325-40 MG tablet, Take by mouth 2 (two) times daily as needed for headache., Disp: , Rfl:    lidocaine (LIDODERM) 5 %, Place 1 patch onto the skin daily. Remove & Discard patch within 12 hours or as directed by MD, Disp: 30 patch, Rfl: 0   oxycodone (OXY-IR) 5 MG capsule, Take 1 capsule (5 mg total) by mouth every 4 (four) hours as needed (severe pain)., Disp: 20 capsule, Rfl: 0   oxyCODONE (ROXICODONE) 5 MG immediate release tablet, Take 1 tablet (5 mg total) by mouth every 4 (four) hours as needed for severe pain., Disp: 30 tablet, Rfl: 0   topiramate (TOPAMAX) 25 MG tablet, Take 50 mg at bedtime for one week, then increase to 75 mg at bedtime for one week, then increase to 100 mg at bedtime, Disp: 120 tablet, Rfl: 3   zolmitriptan (ZOMIG) 5 MG tablet, Take 1 tablet (5 mg total) by mouth as needed for migraine., Disp: 10 tablet, Rfl: 3  Current Facility-Administered Medications:    oxyCODONE (Oxy IR/ROXICODONE) immediate release tablet 5  mg, 5 mg, Oral, Q4H PRN, Vanetta Mulders, MD   Medications ordered in this encounter:  No orders of the defined types were placed in this encounter.    *If you need refills on other medications prior to your next appointment, please contact your pharmacy*  Follow-Up: Call back or seek an in-person evaluation if the symptoms worsen or if the condition fails to improve as anticipated.  [place] [phone]  Other Instructions Please test for COVID and message me with the results  Please keep well-hydrated and get plenty of rest. Start a saline nasal rinse to flush out your nasal passages. You can use plain Mucinex to help thin congestion. Use the Tessalon and Albuterol as directed. If you have a humidifier, running in the bedroom at night. I want you to start OTC vitamin D3 1000 units daily, vitamin C 1000 mg daily, and a zinc supplement. Please take prescribed medications as directed.  If you note any worsening of symptoms, any significant shortness of breath or any chest pain, please seek ER evaluation ASAP.  Please do not delay care!  COVID-19: What to Do if You Are Sick If you test positive and are an older adult or someone who is at high risk of getting very sick from COVID-19, treatment may be available. Contact a healthcare provider right away after a positive test to determine if you are eligible, even if your symptoms are mild right now. You can also visit a  Test to Treat location and, if eligible, receive a prescription from a provider. Don't delay: Treatment must be started within the first few days to be effective. If you have a fever, cough, or other symptoms, you might have COVID-19. Most people have mild illness and are able to recover at home. If you are sick: Keep track of your symptoms. If you have an emergency warning sign (including trouble breathing), call 911. Steps to help prevent the spread of COVID-19 if you are sick If you are sick with  COVID-19 or think you might have COVID-19, follow the steps below to care for yourself and to help protect other people in your home and community. Stay home except to get medical care Stay home. Most people with COVID-19 have mild illness and can recover at home without medical care. Do not leave your home, except to get medical care. Do not visit public areas and do not go to places where you are unable to wear a mask. Take care of yourself. Get rest and stay hydrated. Take over-the-counter medicines, such as acetaminophen, to help you feel better. Stay in touch with your doctor. Call before you get medical care. Be sure to get care if you have trouble breathing, or have any other emergency warning signs, or if you think it is an emergency. Avoid public transportation, ride-sharing, or taxis if possible. Get tested If you have symptoms of COVID-19, get tested. While waiting for test results, stay away from others, including staying apart from those living in your household. Get tested as soon as possible after your symptoms start. Treatments may be available for people with COVID-19 who are at risk for becoming very sick. Don't delay: Treatment must be started early to be effective--some treatments must begin within 5 days of your first symptoms. Contact your healthcare provider right away if your test result is positive to determine if you are eligible. Self-tests are one of several options for testing for the virus that causes COVID-19 and may be more convenient than laboratory-based tests and point-of-care tests. Ask your healthcare provider or your local health department if you need help interpreting your test results. You can visit your state, tribal, local, and territorial health department's website to look for the latest local information on testing sites. Separate yourself from other people As much as possible, stay in a specific room and away from other people and pets in your home. If  possible, you should use a separate bathroom. If you need to be around other people or animals in or outside of the home, wear a well-fitting mask. Tell your close contacts that they may have been exposed to COVID-19. An infected person can spread COVID-19 starting 48 hours (or 2 days) before the person has any symptoms or tests positive. By letting your close contacts know they may have been exposed to COVID-19, you are helping to protect everyone. See COVID-19 and Animals if you have questions about pets. If you are diagnosed with COVID-19, someone from the health department may call you. Answer the call to slow the spread. Monitor your symptoms Symptoms of COVID-19 include fever, cough, or other symptoms. Follow care instructions from your healthcare provider and local health department. Your local health authorities may give instructions on checking your symptoms and reporting information. When to seek emergency medical attention Look for emergency warning signs* for COVID-19. If someone is showing any of these signs, seek emergency medical care immediately: Trouble breathing Persistent pain or pressure in the chest New  confusion Inability to wake or stay awake Pale, gray, or blue-colored skin, lips, or nail beds, depending on skin tone *This list is not all possible symptoms. Please call your medical provider for any other symptoms that are severe or concerning to you. Call 911 or call ahead to your local emergency facility: Notify the operator that you are seeking care for someone who has or may have COVID-19. Call ahead before visiting your doctor Call ahead. Many medical visits for routine care are being postponed or done by phone or telemedicine. If you have a medical appointment that cannot be postponed, call your doctor's office, and tell them you have or may have COVID-19. This will help the office protect themselves and other patients. If you are sick, wear a well-fitting mask You  should wear a mask if you must be around other people or animals, including pets (even at home). Wear a mask with the best fit, protection, and comfort for you. You don't need to wear the mask if you are alone. If you can't put on a mask (because of trouble breathing, for example), cover your coughs and sneezes in some other way. Try to stay at least 6 feet away from other people. This will help protect the people around you. Masks should not be placed on young children under age 7 years, anyone who has trouble breathing, or anyone who is not able to remove the mask without help. Cover your coughs and sneezes Cover your mouth and nose with a tissue when you cough or sneeze. Throw away used tissues in a lined trash can. Immediately wash your hands with soap and water for at least 20 seconds. If soap and water are not available, clean your hands with an alcohol-based hand sanitizer that contains at least 60% alcohol. Clean your hands often Wash your hands often with soap and water for at least 20 seconds. This is especially important after blowing your nose, coughing, or sneezing; going to the bathroom; and before eating or preparing food. Use hand sanitizer if soap and water are not available. Use an alcohol-based hand sanitizer with at least 60% alcohol, covering all surfaces of your hands and rubbing them together until they feel dry. Soap and water are the best option, especially if hands are visibly dirty. Avoid touching your eyes, nose, and mouth with unwashed hands. Handwashing Tips Avoid sharing personal household items Do not share dishes, drinking glasses, cups, eating utensils, towels, or bedding with other people in your home. Wash these items thoroughly after using them with soap and water or put in the dishwasher. Clean surfaces in your home regularly Clean and disinfect high-touch surfaces (for example, doorknobs, tables, handles, light switches, and countertops) in your "sick room"  and bathroom. In shared spaces, you should clean and disinfect surfaces and items after each use by the person who is ill. If you are sick and cannot clean, a caregiver or other person should only clean and disinfect the area around you (such as your bedroom and bathroom) on an as needed basis. Your caregiver/other person should wait as long as possible (at least several hours) and wear a mask before entering, cleaning, and disinfecting shared spaces that you use. Clean and disinfect areas that may have blood, stool, or body fluids on them. Use household cleaners and disinfectants. Clean visible dirty surfaces with household cleaners containing soap or detergent. Then, use a household disinfectant. Use a product from [employer] List N: Disinfectants for Coronavirus (RKYHC-62). Be sure to follow the  instructions on the label to ensure safe and effective use of the product. Many products recommend keeping the surface wet with a disinfectant for a certain period of time (look at "contact time" on the product label). You may also need to wear personal protective equipment, such as gloves, depending on the directions on the product label. Immediately after disinfecting, wash your hands with soap and water for 20 seconds. For completed guidance on cleaning and disinfecting your home, visit Complete Disinfection Guidance. Take steps to improve ventilation at home Improve ventilation (air flow) at home to help prevent from spreading COVID-19 to other people in your household. Clear out COVID-19 virus particles in the air by opening windows, using air filters, and turning on fans in your home. Use this interactive tool to learn how to improve air flow in your home. When you can be around others after being sick with COVID-19 Deciding when you can be around others is different for different situations. Find out when you can safely end home isolation. For any additional questions about your care, contact your  healthcare provider or state or local health department. 09/27/2020 Content source: [facility] ([facility]), Division of Viral Diseases This information is not intended to replace advice given to you by your health care provider. Make sure you discuss any questions you have with your health care provider. Document Revised: 11/10/2020 Document Reviewed: 11/10/2020 Elsevier Patient Education  2022 [employer].      If you have been instructed to have an in-person evaluation today at a local Urgent Care facility, please use the link below. It will take you to a list of all of our available [facility], including address, phone number and hours of operation. Please do not delay care.  [facility]  If you or a family member do not have a primary care provider, use the link below to schedule a visit and establish care. When you choose a [facility] primary care physician or advanced practice provider, you gain a long-term partner in health. Find a Primary Care Provider  Learn more about [facility]'s in-office and virtual care options: [facility]

## 2022-07-31 NOTE — Progress Notes (Signed)
Virtual Visit Consent   Sarah Barber, you are scheduled for a virtual visit with a [facility] provider today. Just as with appointments in the office, your consent must be obtained to participate. Your consent will be active for this visit and any virtual visit you may have with one of our providers in the next 365 days. If you have a MyChart account, a copy of this consent can be sent to you electronically.  As this is a virtual visit, video technology does not allow for your provider to perform a traditional examination. This may limit your provider's ability to fully assess your condition. If your provider identifies any concerns that need to be evaluated in person or the need to arrange testing (such as labs, EKG, etc.), we will make arrangements to do so. Although advances in technology are sophisticated, we cannot ensure that it will always work on either your end or our end. If the connection with a video visit is poor, the visit may have to be switched to a telephone visit. With either a video or telephone visit, we are not always able to ensure that we have a secure connection.  By engaging in this virtual visit, you consent to the provision of healthcare and authorize for your insurance to be billed (if applicable) for the services provided during this visit. Depending on your insurance coverage, you may receive a charge related to this service.  I need to obtain your verbal consent now. Are you willing to proceed with your visit today? Sarah Barber has provided verbal consent on 07/31/2022 for a virtual visit (video or telephone). Leeanne Rio, [place]  Date: 07/31/2022 1:56 PM  Virtual Visit via Video Note   I, Leeanne Rio, connected with  Sarah Barber  ([id number], [birth year]) on 07/31/22 at  2:00 PM EST by a video-enabled telemedicine application and verified that I am speaking with the correct person using two identifiers.  Location: Patient: Virtual Visit  Location Patient: Home Provider: Virtual Visit Location Provider: Home Office   I discussed the limitations of evaluation and management by telemedicine and the availability of in person appointments. The patient expressed understanding and agreed to proceed.    History of Present Illness: Sarah Barber is a 47 y.o. who identifies as a female who was assigned female at birth, and is being seen today for URI symptoms starting Friday into Saturday with nausea, fever, chills, sweats, head and chest congestion with chest tightness and cough. Notes deep breaths are triggering cough.  Children have all been sick but started before her and a lot of symptoms have resolved. She has not been tested for COVID.  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   Anemia due to acute blood loss 07/23/2019   Fibroid uterus 04/27/2019   Family history of breast cancer in first degree relative 04/27/2019   Abnormal uterine bleeding 04/27/2019    Allergies:  Allergies  Allergen Reactions   No Known Allergies Other (See Comments)   Other Rash    pineapple,cabbage   Pineapple Rash   Medications:  Current Outpatient Medications:    albuterol (VENTOLIN HFA) 108 (90 Base) MCG/ACT inhaler, Inhale 2 puffs into the lungs every 6 (six) hours as needed for wheezing or shortness of breath., Disp: 8 g, Rfl: 0   benzonatate (TESSALON) 100 MG capsule, Take  1 capsule (100 mg total) by mouth 3 (three) times daily as needed for cough., Disp: 30 capsule, Rfl: 0   COVID-19 At-Home Test KIT, Use to test for COVID, Disp: 1 kit, Rfl: 0   traMADol (ULTRAM) 50 MG tablet, Take 1 tablet (50 mg total) by mouth every 6 (six) hours as needed., Disp: 30 tablet, Rfl: 0   aspirin EC 325 MG tablet, Take 1 tablet (325 mg total) by mouth daily., Disp: 30 tablet, Rfl: 0    butalbital-acetaminophen-caffeine (FIORICET) 50-325-40 MG tablet, Take by mouth 2 (two) times daily as needed for headache., Disp: , Rfl:    lidocaine (LIDODERM) 5 %, Place 1 patch onto the skin daily. Remove & Discard patch within 12 hours or as directed by MD, Disp: 30 patch, Rfl: 0   oxycodone (OXY-IR) 5 MG capsule, Take 1 capsule (5 mg total) by mouth every 4 (four) hours as needed (severe pain)., Disp: 20 capsule, Rfl: 0   oxyCODONE (ROXICODONE) 5 MG immediate release tablet, Take 1 tablet (5 mg total) by mouth every 4 (four) hours as needed for severe pain., Disp: 30 tablet, Rfl: 0   topiramate (TOPAMAX) 25 MG tablet, Take 50 mg at bedtime for one week, then increase to 75 mg at bedtime for one week, then increase to 100 mg at bedtime, Disp: 120 tablet, Rfl: 3   zolmitriptan (ZOMIG) 5 MG tablet, Take 1 tablet (5 mg total) by mouth as needed for migraine., Disp: 10 tablet, Rfl: 3  Current Facility-Administered Medications:    oxyCODONE (Oxy IR/ROXICODONE) immediate release tablet 5 mg, 5 mg, Oral, Q4H PRN, Vanetta Mulders, MD  Observations/Objective: Patient is well-developed, well-nourished in no acute distress.  Resting comfortably at home.  Head is normocephalic, atraumatic.  No labored breathing. Speech is clear and coherent with logical content.  Patient is alert and oriented at baseline.   Assessment and Plan: 1. Suspected COVID-19 virus infection - COVID-19 At-Home Test KIT; Use to test for COVID  Dispense: 1 kit; Refill: 0 - benzonatate (TESSALON) 100 MG capsule; Take 1 capsule (100 mg total) by mouth 3 (three) times daily as needed for cough.  Dispense: 30 capsule; Refill: 0 - albuterol (VENTOLIN HFA) 108 (90 Base) MCG/ACT inhaler; Inhale 2 puffs into the lungs every 6 (six) hours as needed for wheezing or shortness of breath.  Dispense: 8 g; Refill: 0  Will have her test at home. If positive, will start antiviral. Supportive measures and OTC medications reviewed. Tessalon and  Albuterol per orders.  Follow Up Instructions: I discussed the assessment and treatment plan with the patient. The patient was provided an opportunity to ask questions and all were answered. The patient agreed with the plan and demonstrated an understanding of the instructions.  A copy of instructions were sent to the patient via MyChart unless otherwise noted below.    The patient was advised to call back or seek an in-person evaluation if the symptoms worsen or if the condition fails to improve as anticipated.  Time:  I spent 12 minutes with the patient via telehealth technology discussing the above problems/concerns.    Leeanne Rio, PA-C

## 2022-08-23 ENCOUNTER — Ambulatory Visit (HOSPITAL_BASED_OUTPATIENT_CLINIC_OR_DEPARTMENT_OTHER): Payer: Medicaid Other | Admitting: Physical Therapy

## 2022-09-24 ENCOUNTER — Encounter (HOSPITAL_COMMUNITY): Payer: Self-pay

## 2022-09-24 ENCOUNTER — Other Ambulatory Visit: Payer: Self-pay

## 2022-09-24 ENCOUNTER — Emergency Department (HOSPITAL_COMMUNITY)
Admission: EM | Admit: 2022-09-24 | Discharge: 2022-09-24 | Disposition: A | Discharge status: Home / Self Care | Payer: Medicaid Other | Attending: Emergency Medicine | Admitting: Emergency Medicine

## 2022-09-24 DIAGNOSIS — G43909 Migraine, unspecified, not intractable, without status migrainosus: Secondary | ICD-10-CM | POA: Diagnosis not present

## 2022-09-24 DIAGNOSIS — R110 Nausea: Secondary | ICD-10-CM

## 2022-09-24 DIAGNOSIS — Z7982 Long term (current) use of aspirin: Secondary | ICD-10-CM | POA: Insufficient documentation

## 2022-09-24 DIAGNOSIS — R519 Headache, unspecified: Secondary | ICD-10-CM | POA: Diagnosis present

## 2022-09-24 LAB — CBC WITH DIFFERENTIAL/PLATELET
Abs Immature Granulocytes: 0 10*3/uL (ref 0.00–0.07)
Basophils Absolute: 0.1 10*3/uL (ref 0.0–0.1)
Basophils Relative: 1 %
Eosinophils Absolute: 0.1 10*3/uL (ref 0.0–0.5)
Eosinophils Relative: 3 %
HCT: 43.2 % (ref 36.0–46.0)
Hemoglobin: 13.9 g/dL (ref 12.0–15.0)
Immature Granulocytes: 0 %
Lymphocytes Relative: 37 %
Lymphs Abs: 1.8 10*3/uL (ref 0.7–4.0)
MCH: 29.2 pg (ref 26.0–34.0)
MCHC: 32.2 g/dL (ref 30.0–36.0)
MCV: 90.8 fL (ref 80.0–100.0)
Monocytes Absolute: 0.5 10*3/uL (ref 0.1–1.0)
Monocytes Relative: 10 %
Neutro Abs: 2.5 10*3/uL (ref 1.7–7.7)
Neutrophils Relative %: 49 %
Platelets: 249 10*3/uL (ref 150–400)
RBC: 4.76 MIL/uL (ref 3.87–5.11)
RDW: 12.1 % (ref 11.5–15.5)
WBC: 4.9 10*3/uL (ref 4.0–10.5)
nRBC: 0 % (ref 0.0–0.2)

## 2022-09-24 LAB — BASIC METABOLIC PANEL
Anion gap: 8 (ref 5–15)
BUN: 6 mg/dL (ref 6–20)
CO2: 25 mmol/L (ref 22–32)
Calcium: 9.3 mg/dL (ref 8.9–10.3)
Chloride: 104 mmol/L (ref 98–111)
Creatinine, Ser: 0.59 mg/dL (ref 0.44–1.00)
GFR, Estimated: >60 mL/min (ref >60)
Glucose, Bld: 109 mg/dL — ABNORMAL HIGH (ref 70–99)
Potassium: 3.5 mmol/L (ref 3.5–5.1)
Sodium: 137 mmol/L (ref 135–145)

## 2022-09-24 MED ORDER — ONDANSETRON 4 MG PO TBDP: 4.0000 mg | ORAL_TABLET | Freq: Three times a day (TID) | ORAL | 0 refills | Status: DC | PRN

## 2022-09-24 MED ORDER — DEXAMETHASONE 4 MG PO TABS
4.0000 mg | ORAL_TABLET | Freq: Once | ORAL | Status: AC
  Administered 2022-09-24: 4 mg via ORAL
  Filled 2022-09-24: qty 1

## 2022-09-24 MED ORDER — METOCLOPRAMIDE HCL 10 MG PO TABS
10.0000 mg | ORAL_TABLET | Freq: Once | ORAL | Status: AC
  Administered 2022-09-24: 10 mg via ORAL
  Filled 2022-09-24: qty 1

## 2022-09-24 MED ORDER — KETOROLAC TROMETHAMINE 15 MG/ML IJ SOLN
15.0000 mg | Freq: Once | INTRAMUSCULAR | Status: AC
  Administered 2022-09-24: 15 mg via INTRAMUSCULAR
  Filled 2022-09-24: qty 1

## 2022-09-24 NOTE — Discharge Instructions (Signed)
You were seen in the emergency department for a migraine headache. Given your history of migraines, a migraine cocktail was given to you which improved your symptoms fairly well. If your symptoms return, please return to the emergency department for further evaluation. A prescription for Zofran has been sent to your pharmacy for nausea as needed.

## 2022-09-24 NOTE — ED Provider Triage Note (Signed)
Emergency Medicine Provider Triage Evaluation Note  Sarah Barber , a 47 y.o. female  was evaluated in triage.  Pt complains of migraine headache for the last 6 days.  She describes headache as right frontal and retro-orbital and associated with photophobia and nausea.  She states she has a longstanding history of migraine headaches for which she takes tramadol and another unknown medication at home.  No recent fall or head injury.  No associated fever, neck pain, vomiting, diarrhea, cough, congestion, chest pain, shortness of breath, or focal weakness.  Review of Systems  Positive: See HPI Negative: See HPI  Physical Exam  BP 109/76   Pulse 77   Temp 98.6 F (37 C)   Resp 15   LMP 07/09/2019 (Exact Date)   SpO2 100%  Gen:   Awake, no distress   Resp:  Normal effort  MSK:   Moves extremities without difficulty  Other:  Alert and oriented, cranial nerves intact, 5/5 strength to bilateral upper and lower extremities, normal speech, extraocular movements intact, PERRL  Medical Decision Making  Medically screening exam initiated at 1:43 PM.  Appropriate orders placed.  Sarah Barber was informed that the remainder of the evaluation will be completed by another provider, this initial triage assessment does not replace that evaluation, and the importance of remaining in the ED until their evaluation is complete.     [name], PA-C 09/24/22 1345

## 2022-09-24 NOTE — ED Provider Notes (Signed)
[place] EMERGENCY DEPARTMENT AT [facility] Provider Note   CSN: [id number] Arrival date & time: 09/24/22  1330     History Chief Complaint  Patient presents with   Headache    Sarah Barber is a 47 y.o. female.  Patient with past history significant for migraine headaches presents the emergency department complaints of a headache.  She reports she has had a persistent headache for the last 6 days with some associated light sensitivity and nausea.  Reports that she takes tramadol for more severe migraines but this is unable to cover her pain or resolve symptoms.  Patient was given dose of Decadron, Toradol, Reglan in triage for migraine management. She denies any chest pain, abdominal pain, diarrhea, or vomiting.    Headache      Home Medications Prior to Admission medications   Medication Sig Start Date End Date Taking? Authorizing Provider  ondansetron (ZOFRAN-ODT) 4 MG disintegrating tablet Take 1 tablet (4 mg total) by mouth every 8 (eight) hours as needed for nausea or vomiting. 09/24/22  Yes Santiago Stenzel A, PA-C  traMADol (ULTRAM) 50 MG tablet Take 1 tablet (50 mg total) by mouth every 6 (six) hours as needed. 03/14/22   Huel Cote, MD  albuterol (VENTOLIN HFA) 108 (90 Base) MCG/ACT inhaler Inhale 2 puffs into the lungs every 6 (six) hours as needed for wheezing or shortness of breath. 07/31/22   Waldon Merl, PA-C  aspirin EC 325 MG tablet Take 1 tablet (325 mg total) by mouth daily. 12/29/21   Huel Cote, MD  benzonatate (TESSALON) 100 MG capsule Take 1 capsule (100 mg total) by mouth 3 (three) times daily as needed for cough. 07/31/22   Waldon Merl, PA-C  butalbital-acetaminophen-caffeine (FIORICET) [phone] MG tablet Take by mouth 2 (two) times daily as needed for headache.    [provider]  COVID-19 At-Home Test KIT Use to test for COVID 07/31/22   Waldon Merl, PA-C  lidocaine (LIDODERM) 5 % Place 1 patch onto the skin daily.  Remove & Discard patch within 12 hours or as directed by MD 12/18/21   Nicanor Alcon, April, MD  oxycodone (OXY-IR) 5 MG capsule Take 1 capsule (5 mg total) by mouth every 4 (four) hours as needed (severe pain). 12/29/21   Huel Cote, MD  oxyCODONE (ROXICODONE) 5 MG immediate release tablet Take 1 tablet (5 mg total) by mouth every 4 (four) hours as needed for severe pain. 01/25/22   Huel Cote, MD  topiramate (TOPAMAX) 25 MG tablet Take 50 mg at bedtime for one week, then increase to 75 mg at bedtime for one week, then increase to 100 mg at bedtime 09/25/21   Ocie Doyne, MD  zolmitriptan (ZOMIG) 5 MG tablet Take 1 tablet (5 mg total) by mouth as needed for migraine. 09/25/21   Ocie Doyne, MD      Allergies    No known allergies, Other, and Pineapple    Review of Systems   Review of Systems  Neurological:  Positive for headaches.  All other systems reviewed and are negative.   Physical Exam Updated Vital Signs BP 122/87 (BP Location: Left Arm)   Pulse 64   Temp 98.6 F (37 C)   Resp 16   LMP 07/09/2019 (Exact Date)   SpO2 100%  Physical Exam Vitals and nursing note reviewed.  Constitutional:      Appearance: She is well-developed.  HENT:     Head: Normocephalic and atraumatic.  Cardiovascular:  Rate and Rhythm: Normal rate and regular rhythm.     Heart sounds: Normal heart sounds.  Pulmonary:     Effort: Pulmonary effort is normal.     Breath sounds: Normal breath sounds.  Abdominal:     General: There is no distension.     Palpations: Abdomen is soft.  Musculoskeletal:        General: Normal range of motion.     Cervical back: Normal range of motion.  Skin:    General: Skin is warm.  Neurological:     Mental Status: She is alert.     Cranial Nerves: No cranial nerve deficit or facial asymmetry.     Comments: CN II-XII intact  Psychiatric:        Mood and Affect: Mood normal.     ED Results / Procedures / Treatments   Labs (all labs ordered are  listed, but only abnormal results are displayed) Labs Reviewed  BASIC METABOLIC PANEL - Abnormal; Notable for the following components:      Result Value   Glucose, Bld 109 (*)    All other components within normal limits  CBC WITH DIFFERENTIAL/PLATELET    EKG None  Radiology No results found.  Procedures Procedures   Medications Ordered in ED Medications  ketorolac (TORADOL) 15 MG/ML injection 15 mg (15 mg Intramuscular Given 09/24/22 1414)  metoCLOPramide (REGLAN) tablet 10 mg (10 mg Oral Given 09/24/22 1415)  dexamethasone (DECADRON) tablet 4 mg (4 mg Oral Given 09/24/22 1415)    ED Course/ Medical Decision Making/ A&P                           Medical Decision Making Risk Prescription drug management.   This patient presents to the ED for concern of.  Differential diagnosis includes tension headache, migraine headache, pharyngitis, stroke   Lab Tests:  I Ordered, and personally interpreted labs.  The pertinent results include: Normal CBC and CMP   Medicines ordered and prescription drug management:  I ordered medication including Reglan, Toradol, Decadron for migraine Reevaluation of the patient after these medicines showed that the patient improved I have reviewed the patients home medicines and have made adjustments as needed   Problem List / ED Course:  Patient presented to the ED with complaints of a migraine headache. History of similar headaches previously managed with migraine cocktails. Given lack of neurological symptoms and headache appearing to be consistent with previous migraine headaches, migraine cocktail was given to patient while in triage with improvement in symptoms. Encouraged patient to follow up with PCP for better migraine prophylaxis or to return to the ED if her symptoms worsen. Patient agreeable with treatment plan and verbalized understanding all return precautions. Prescription for Zofran ODT sent to patient's pharmacy to aid with  associated nausea she experiences with migraines.   Final Clinical Impression(s) / ED Diagnoses Final diagnoses:  Migraine without status migrainosus, not intractable, unspecified migraine type  Nausea    Rx / DC Orders ED Discharge Orders          Ordered    ondansetron (ZOFRAN-ODT) 4 MG disintegrating tablet  Every 8 hours PRN        09/24/22 1703              Smitty Knudsen, PA-C 09/24/22 1709    Vanetta Mulders, MD 09/25/22 1350

## 2022-09-24 NOTE — ED Triage Notes (Signed)
Pt BIB [facility] from home c/o a headache that has been constant x6 days. Pt states she has been taking tramadol and tylenol with no help. Pt does have a hx of migraines. Pt also c/o sensitivity to light and nausea.

## 2022-10-23 ENCOUNTER — Institutional Professional Consult (permissible substitution): Payer: Medicaid Other | Admitting: Psychiatry

## 2022-10-26 ENCOUNTER — Ambulatory Visit (HOSPITAL_BASED_OUTPATIENT_CLINIC_OR_DEPARTMENT_OTHER): Payer: Medicaid Other | Admitting: Orthopaedic Surgery

## 2022-11-07 ENCOUNTER — Encounter (HOSPITAL_BASED_OUTPATIENT_CLINIC_OR_DEPARTMENT_OTHER): Payer: Self-pay | Admitting: Orthopaedic Surgery

## 2022-11-07 ENCOUNTER — Ambulatory Visit (HOSPITAL_BASED_OUTPATIENT_CLINIC_OR_DEPARTMENT_OTHER): Payer: Medicaid Other | Admitting: Orthopaedic Surgery

## 2022-11-07 DIAGNOSIS — M2351 Chronic instability of knee, right knee: Secondary | ICD-10-CM

## 2022-11-07 NOTE — Progress Notes (Signed)
Post Operative Evaluation    Procedure/Date of Surgery: Right knee MPFL reconstruction 7/18  Interval History:   Presents today overall doing extremely well.  At this visit her knee range of motion is dramatically improved.  She has no pain in the knee.  Occasionally she experiences some soreness although this is quite mild.  She is overall very happy with how she is doing.  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   Vision changes    Past Surgical History:  Procedure Laterality Date   CESAREAN SECTION      x 2   HYSTERECTOMY ABDOMINAL WITH SALPINGECTOMY Bilateral 07/28/2019   Procedure: HYSTERECTOMY ABDOMINAL WITH BILATERAL SALPINGECTOMY AND RIGHT OOPHORECTOMY;  Surgeon: Reva Bores, MD;  Location: [facility] OR;  Service: Gynecology;  Laterality: Bilateral;   KNEE ARTHROSCOPY Right 01/23/2022   Procedure: RIGHT KNEE ARTHROSCOPY WITH DEBRIDEMENT;  Surgeon: Huel Cote, MD;  Location: [facility];  Service: Orthopedics;  Laterality: Right;   KNEE RECONSTRUCTION Right 01/23/2022   Procedure: RIGHT KNEE PATELLOFEMORAL LIGAMENT RECONSTRUCTION;  Surgeon: Huel Cote, MD;  Location: [facility];  Service: Orthopedics;  Laterality: Right;   Social History   Socioeconomic History   Marital status: Single    Spouse name: Not on file   Number of children: Not on file   Years of education: Not on file   Highest education level: Not on file  Occupational History   Not on file  Tobacco Use   Smoking status: Former    Types: Cigarettes    Quit date: 09/2015    Years since quitting: 7.1   Smokeless tobacco: Never   Tobacco  comments:    smoked off and on  Vaping Use   Vaping Use: Never used  Substance and Sexual Activity   Alcohol use: Yes    Comment: occasional   Drug use: Never   Sexual activity: Not on file  Other Topics Concern   Not on file  Social History Narrative   Right handed    Caffeine- rare    Social Determinants of Health   Financial Resource Strain: Not on file  Food Insecurity: Not on file  Transportation Needs: Not on file  Physical Activity: Not on file  Stress: Not on file  Social Connections: Not on file   Family History  Problem Relation Age of Onset   Diabetes Mother    Cerebrovascular Accident Father        x2   Diabetes Maternal Grandmother    Kidney disease Maternal Grandmother    Lung cancer Maternal Grandfather    Breast cancer Neg Hx    Allergies  Allergen Reactions   No Known Allergies Other (See Comments)   Other Rash    pineapple,cabbage   Pineapple Rash   Current Outpatient Medications  Medication Sig Dispense Refill   traMADol (ULTRAM) 50 MG tablet Take 1 tablet (50 mg total) by mouth every  6 (six) hours as needed. 30 tablet 0   albuterol (VENTOLIN HFA) 108 (90 Base) MCG/ACT inhaler Inhale 2 puffs into the lungs every 6 (six) hours as needed for wheezing or shortness of breath. 8 g 0   aspirin EC 325 MG tablet Take 1 tablet (325 mg total) by mouth daily. 30 tablet 0   benzonatate (TESSALON) 100 MG capsule Take 1 capsule (100 mg total) by mouth 3 (three) times daily as needed for cough. 30 capsule 0   butalbital-acetaminophen-caffeine (FIORICET) 50-325-40 MG tablet Take by mouth 2 (two) times daily as needed for headache.     COVID-19 At-Home Test KIT Use to test for COVID 1 kit 0   lidocaine (LIDODERM) 5 % Place 1 patch onto the skin daily. Remove & Discard patch within 12 hours or as directed by MD 30 patch 0   ondansetron (ZOFRAN-ODT) 4 MG disintegrating tablet Take 1 tablet (4 mg total) by mouth every 8 (eight) hours as needed for nausea or vomiting.  20 tablet 0   oxycodone (OXY-IR) 5 MG capsule Take 1 capsule (5 mg total) by mouth every 4 (four) hours as needed (severe pain). 20 capsule 0   oxyCODONE (ROXICODONE) 5 MG immediate release tablet Take 1 tablet (5 mg total) by mouth every 4 (four) hours as needed for severe pain. 30 tablet 0   topiramate (TOPAMAX) 25 MG tablet Take 50 mg at bedtime for one week, then increase to 75 mg at bedtime for one week, then increase to 100 mg at bedtime 120 tablet 3   zolmitriptan (ZOMIG) 5 MG tablet Take 1 tablet (5 mg total) by mouth as needed for migraine. 10 tablet 3   Current Facility-Administered Medications  Medication Dose Route Frequency Provider Last Rate Last Admin   oxyCODONE (Oxy IR/ROXICODONE) immediate release tablet 5 mg  5 mg Oral Q4H PRN Huel Cote, MD       No results found.  Review of Systems:   A ROS was performed including pertinent positives and negatives as documented in the HPI.   Musculoskeletal Exam:     Right knee incisions are healed no erythema or drainage.  No effusion about the knee.  2 quadrants of lateral medial motion of the patella.  Range of motion is from 0 degrees to approximately 120 degrees.  Quad atrophy with decreased tone.  Negative apprehension with lateral patellar force. Distal neurosensory exam is intact  Imaging:    None  I personally reviewed and interpreted the radiographs.   Assessment:   47 year old female who is 10 months status post right MPFL reconstruction overall doing extremely well.  Today's visit her quadriceps muscle is dramatically improved.  Her strength is improved.  She is now walking without any type of gait abnormality.  Just has some mild soreness.  I will plan to see her back as needed Plan :    -Return to clinic as needed      I personally saw and evaluated the patient, and participated in the management and treatment plan.  Huel Cote, MD Attending Physician, Orthopedic Surgery  This document was  dictated using Dragon voice recognition software. A reasonable attempt at proof reading has been made to minimize errors.  Short little buried steps

## 2022-11-12 ENCOUNTER — Ambulatory Visit (HOSPITAL_BASED_OUTPATIENT_CLINIC_OR_DEPARTMENT_OTHER): Payer: Medicaid Other | Admitting: Physical Therapy

## 2022-11-16 ENCOUNTER — Ambulatory Visit (HOSPITAL_BASED_OUTPATIENT_CLINIC_OR_DEPARTMENT_OTHER): Payer: Medicaid Other | Admitting: Physical Therapy

## 2022-12-12 ENCOUNTER — Encounter: Payer: Self-pay | Admitting: Psychiatry

## 2022-12-12 ENCOUNTER — Ambulatory Visit (HOSPITAL_BASED_OUTPATIENT_CLINIC_OR_DEPARTMENT_OTHER): Payer: Medicaid Other | Attending: Orthopaedic Surgery | Admitting: Physical Therapy

## 2022-12-12 ENCOUNTER — Telehealth: Payer: Self-pay | Admitting: Psychiatry

## 2022-12-12 NOTE — Telephone Encounter (Signed)
LVM, sent mychart msg, sent letter informing pt of appt r/s- [name] out on 7/25.

## 2022-12-17 ENCOUNTER — Other Ambulatory Visit: Payer: Self-pay | Admitting: Endocrinology

## 2022-12-17 DIAGNOSIS — Z1231 Encounter for screening mammogram for malignant neoplasm of breast: Secondary | ICD-10-CM

## 2023-01-02 ENCOUNTER — Ambulatory Visit: Payer: Medicaid Other

## 2023-01-09 ENCOUNTER — Ambulatory Visit: Payer: Medicaid Other | Attending: Orthopaedic Surgery | Admitting: Physical Therapy

## 2023-01-09 ENCOUNTER — Encounter: Payer: Self-pay | Admitting: Physical Therapy

## 2023-01-09 ENCOUNTER — Other Ambulatory Visit: Payer: Self-pay

## 2023-01-09 DIAGNOSIS — M25661 Stiffness of right knee, not elsewhere classified: Secondary | ICD-10-CM | POA: Insufficient documentation

## 2023-01-09 DIAGNOSIS — R262 Difficulty in walking, not elsewhere classified: Secondary | ICD-10-CM | POA: Diagnosis present

## 2023-01-09 DIAGNOSIS — M2351 Chronic instability of knee, right knee: Secondary | ICD-10-CM | POA: Insufficient documentation

## 2023-01-09 DIAGNOSIS — M6281 Muscle weakness (generalized): Secondary | ICD-10-CM | POA: Insufficient documentation

## 2023-01-09 NOTE — Therapy (Addendum)
OUTPATIENT PHYSICAL THERAPY LOWER EXTREMITY EVALUATION   Patient Name: Sarah Barber MRN: [id number] DOB:12-24-1975, 47 y.o., female Today's Date: 01/09/2023  END OF SESSION:  PT End of Session - 01/09/23 1537     Visit Number 1    Number of Visits 9   8 + eval   Date for PT Re-Evaluation 02/15/23   pushed out due to patient transferring back to [facility] per preference   Authorization Type [place] MEDICAID HE[employer]Y BLUE    PT Start Time 1531    PT Stop Time 1615    PT Time Calculation (min) 44 min    Behavior During Therapy WFL for tasks assessed/performed             Past Medical History:  Diagnosis Date   Allergies    Anxiety    Bipolar disorder (HCC)    Chronic headaches    Depression    Fatigue    Menstrual pain    Panic attacks    PTSD (post-traumatic stress disorder)    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   Vision changes    Past Surgical History:  Procedure Laterality Date   CESAREAN SECTION      x 2   HYSTERECTOMY ABDOMINAL WITH SALPINGECTOMY Bilateral 07/28/2019   Procedure: HYSTERECTOMY ABDOMINAL WITH BILATERAL SALPINGECTOMY AND RIGHT OOPHORECTOMY;  Surgeon: Reva Bores, MD;  Location: [facility];  Service: Gynecology;  Laterality: Bilateral;   KNEE ARTHROSCOPY Right 01/23/2022   Procedure: RIGHT KNEE ARTHROSCOPY WITH DEBRIDEMENT;  Surgeon: Huel Cote, MD;  Location: [facility];  Service: Orthopedics;  Laterality: Right;   KNEE RECONSTRUCTION Right 01/23/2022   Procedure: RIGHT KNEE PATELLOFEMORAL LIGAMENT RECONSTRUCTION;  Surgeon: Huel Cote, MD;  Location: [facility];  Service: Orthopedics;  Laterality: Right;   Patient Active Problem List   Diagnosis Date Noted   PVNS (pigmented villonodular synovitis)    Patellar instability of right knee    Status post hysterectomy 07/28/2019   Anemia due to acute blood loss 07/23/2019   Fibroid uterus 04/27/2019   Family history of breast cancer in  first degree relative 04/27/2019   Abnormal uterine bleeding 04/27/2019    PCP: Felix Pacini, FNP  REFERRING PROVIDER: Huel Cote, MD  REFERRING DIAG: M23.51 (ICD-10-CM) - Recurrent right knee instability  THERAPY DIAG:  Stiffness of right knee, not elsewhere classified  Muscle weakness (generalized)  Difficulty in walking, not elsewhere classified  Rationale for Evaluation and Treatment: Rehabilitation  ONSET DATE: 01/24/2023  SUBJECTIVE:   SUBJECTIVE STATEMENT: Her pain is gone now, but the stiffness in the right knee persists.  She remains scared to try things like doing her road test to drive and bike of any kind.  She has not been doing her HEP at all since discharge.  She reports she is scared of re-injuring herself.   PERTINENT HISTORY: PF ligament reconstruction 01/23/2022, migraines  PAIN:  Are you having pain? No  PRECAUTIONS: None  WEIGHT BEARING RESTRICTIONS: No  FALLS:  Has patient fallen in last 6 months? 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 01/23/2023 for migraines  OBJECTIVE:   DIAGNOSTIC FINDINGS: No recent relevant imaging.  PATIENT SURVEYS:  LEFS 57/80  COGNITION: Overall cognitive status: Within functional limits for tasks assessed     SENSATION: WFL  EDEMA:  None noted in right compared to left knee.  POSTURE: No Significant postural limitations  PALPATION: Not TTP around patella margins, patellar tendon, tibial tuberosity, quadriceps tendon, or over hypertrophic scarring.  LOWER EXTREMITY ROM:  Active ROM Right eval Left eval  Hip flexion    Hip extension    Hip abduction    Hip adduction    Hip internal rotation    Hip external rotation    Knee  flexion    Knee extension Lacks 20 degrees actively in sitting; passive has full range no pain   Ankle dorsiflexion    Ankle plantarflexion    Ankle inversion    Ankle eversion     (Blank rows = not tested)  LOWER EXTREMITY MMT:  MMT Right eval Left eval  Hip flexion 3/5   Hip extension    Hip abduction    Hip adduction    Hip internal rotation    Hip external rotation    Knee flexion 4-/5   Knee extension 2+/5   Ankle dorsiflexion 5/5   Ankle plantarflexion    Ankle inversion    Ankle eversion     (Blank rows = not tested)  LOWER EXTREMITY SPECIAL TESTS:  Knee special tests: patellar glide test:  negative on the right, patient has fingertip width more lateral translation on the left patella than right  FUNCTIONAL TESTS:  5 times sit to stand: 25.90 seconds no UE support; bilateral knee valgus 10 meter walk test: 9.25 seconds w/o AD =  1.08 m/sec OR 3.57 ft/sec  Right SLS:  3 seconds unsupported  GAIT: Distance walked: various clinic distances Assistive device utilized: None Level of assistance: Complete Independence Comments: Pt is hesitant to put weight evenly on RLE due to fear of buckling which was not noted during ambulatory and SLS tests today.  TODAY'S TREATMENT:                                                                                                                              DATE: N/A eval only.   PATIENT EDUCATION:  Education details: Provided information on [employer] to establish membership and self-progress. Person educated: Patient Education method: Explanation Education comprehension: verbalized understanding  HOME EXERCISE PROGRAM: Will review from prior episode of care.  ASSESSMENT:  CLINICAL IMPRESSION: Patient is a 47 y.o. female who was seen today for physical therapy evaluation and treatment for right knee instability.  Pt has a significant PMH of right patellofemoral ligament reconstruction 01/24/2023 and migraines.  Identified  impairments include right quad weakness and decrease active knee extension.  Patient has fear avoidance tendencies with RLE weight-bearing.  Evaluation via the following assessment tools: 5xSTS and indicate mildly increased fall risk.  She would benefit from skilled PT to address impairments as noted and progress towards long term goals.  OBJECTIVE IMPAIRMENTS:  Abnormal gait, decreased knowledge of condition, and decreased strength.   ACTIVITY LIMITATIONS: squatting, stairs, transfers, and locomotion level  PARTICIPATION LIMITATIONS: driving, community activity, occupation, and yard work  PERSONAL FACTORS: Behavior pattern, Fitness, Past/current experiences, Sex, Time since onset of injury/illness/exacerbation, and 1 comorbidity: migraines  are also affecting patient's functional outcome.   REHAB POTENTIAL: Good  CLINICAL DECISION MAKING: Stable/uncomplicated  EVALUATION COMPLEXITY: Low   GOALS: Goals reviewed with patient? Yes  SHORT TERM GOALS: Target date: 02/08/2023 Pt will be independent and compliant with strengthening, stretching, and balance focused HEP in order to maintain functional progress and improve mobility. Baseline:  To be reviewed/modified. Goal status: INITIAL  2.  Pt will decrease 5xSTS to </=20 seconds in order to demonstrate decreased risk for falls and improved functional bilateral LE strength and power. Baseline: 25.90 seconds no UE support Goal status: INITIAL  3.  Pt will demonstrate a gait speed of >/=1.30 m/sec in order to decrease risk for falls. Baseline: 1.08 m/sec Goal status: INITIAL  4.  Patient will be able to hold unsupported right SLS for >/=10 seconds in order to demonstrate improved right LE strength and static balance. Baseline: 3 seconds Goal status: INITIAL  PLAN:  PT FREQUENCY: 2x/week  PT DURATION: 4 weeks (pt returns to work 02/07/2023 - currently on leave due to migraines)  PLANNED INTERVENTIONS: Therapeutic exercises,  Therapeutic activity, Neuromuscular re-education, Balance training, Gait training, Patient/Family education, Self Care, Joint mobilization, Stair training, Electrical stimulation, Cryotherapy, Moist heat, scar mobilization, Taping, Ultrasound, Manual therapy, and Re-evaluation  PLAN FOR NEXT SESSION: Review HEP from prior episode of care-modify as needed.  Has patient established gym membership?  Work on Facilities manager, quad control.  Check all possible CPT codes: 40981 - PT Re-evaluation, 97110- Therapeutic Exercise, [phone]- Neuro Re-education, [phone] - Gait Training, [phone] - Manual Therapy, [phone] - Therapeutic Activities, [phone] - Self Care, [phone] - Electrical stimulation (Manual), and [id number] - Ultrasound    Check all conditions that are expected to impact treatment: Musculoskeletal disorders and Neurological condition and/or seizures   If treatment provided at initial evaluation, no treatment charged due to lack of authorization.   Sadie Haber, PT, DPT 01/09/2023, 4:36 PM

## 2023-01-13 NOTE — Therapy (Incomplete)
OUTPATIENT PHYSICAL THERAPY LOWER EXTREMITY EVALUATION   Patient Name: Sarah Barber MRN: [id number] DOB:16-Dec-1975, 47 y.o., female Today's Date: 01/09/2023  END OF SESSION:  PT End of Session - 01/09/23 1537     Visit Number 1    Number of Visits 9   8 + eval   Date for PT Re-Evaluation 02/15/23   pushed out due to patient transferring back to [facility] per preference   Authorization Type [place] MEDICAID HEALTHY BLUE    PT Start Time 1531    PT Stop Time 1615    PT Time Calculation (min) 44 min    Behavior During Therapy [facility] for tasks assessed/performed             Past Medical History:  Diagnosis Date  . Allergies   . Anxiety   . Bipolar disorder (HCC)   . Chronic headaches   . Depression   . Fatigue   . Menstrual pain   . Panic attacks   . PTSD (post-traumatic stress disorder)   . Seizure [facility])    as a child - last one at age 89- petite mal  . Vision changes    Past Surgical History:  Procedure Laterality Date  . CESAREAN SECTION      x 2  . HYSTERECTOMY ABDOMINAL WITH SALPINGECTOMY Bilateral 07/28/2019   Procedure: HYSTERECTOMY ABDOMINAL WITH BILATERAL SALPINGECTOMY AND RIGHT OOPHORECTOMY;  Surgeon: Sarah Bores, MD;  Location[facility];  Service: Gynecology;  Laterality: Bilateral;  . KNEE ARTHROSCOPY Right 01/23/2022   Procedure: RIGHT KNEE ARTHROSCOPY WITH DEBRIDEMENT;  Surgeon: Sarah Cote, MD;  Location: [facility];  Service: Orthopedics;  Laterality: Right;  . KNEE RECONSTRUCTION Right 01/23/2022   Procedure: RIGHT KNEE PATELLOFEMORAL LIGAMENT RECONSTRUCTION;  Surgeon: Sarah Cote, MD;  Location: [facility];  Service: Orthopedics;  Laterality: Right;   Patient Active Problem List   Diagnosis Date Noted  . PVNS (pigmented villonodular synovitis)   . Patellar instability of right knee   . Status post hysterectomy 07/28/2019  . Anemia due to acute blood loss 07/23/2019  . Fibroid uterus 04/27/2019  . Family history of  breast cancer in first degree relative 04/27/2019  . Abnormal uterine bleeding 04/27/2019    PCP: Sarah Pacini, FNP  REFERRING PROVIDER: Huel Cote, MD  REFERRING DIAG: M23.51 (ICD-10-CM) - Recurrent right knee instability  THERAPY DIAG:  Stiffness of right knee, not elsewhere classified  Muscle weakness (generalized)  Difficulty in walking, not elsewhere classified  Rationale for Evaluation and Treatment: Rehabilitation  ONSET DATE: 01/24/2023  SUBJECTIVE:   SUBJECTIVE STATEMENT: Her pain is gone now, but the stiffness in the right knee persists.  She remains scared to try things like doing her road test to drive and bike of any kind.  She has not been doing her HEP at all since discharge.  She reports she is scared of re-injuring herself.   PERTINENT HISTORY: PF ligament reconstruction 01/23/2022, migraines  PAIN:  Are you having pain? No  PRECAUTIONS: None  WEIGHT BEARING RESTRICTIONS: No  FALLS:  Has patient fallen in last 6 months? 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 Delena Barber 01/23/2023 for migraines  OBJECTIVE:   DIAGNOSTIC FINDINGS: No recent relevant imaging.  PATIENT SURVEYS:  LEFS 57/80  COGNITION: Overall cognitive status: Within functional limits for tasks assessed     SENSATION: WFL  EDEMA:  None noted in right compared to left knee.  POSTURE: No Significant postural limitations  PALPATION: Not TTP around patella margins, patellar tendon, tibial tuberosity, quadriceps tendon, or over hypertrophic scarring.  LOWER EXTREMITY ROM:  Active ROM Right eval Left eval  Hip flexion    Hip extension    Hip abduction    Hip adduction    Hip internal rotation    Hip external  rotation    Knee flexion    Knee extension Lacks 20 degrees actively in sitting; passive has full range no pain   Ankle dorsiflexion    Ankle plantarflexion    Ankle inversion    Ankle eversion     (Blank rows = not tested)  LOWER EXTREMITY MMT:  MMT Right eval Left eval  Hip flexion 3/5   Hip extension    Hip abduction    Hip adduction    Hip internal rotation    Hip external rotation    Knee flexion 4-/5   Knee extension 2+/5   Ankle dorsiflexion 5/5   Ankle plantarflexion    Ankle inversion    Ankle eversion     (Blank rows = not tested)  LOWER EXTREMITY SPECIAL TESTS:  Knee special tests: patellar glide test:  negative on the right, patient has fingertip width more lateral translation on the left patella than right  FUNCTIONAL TESTS:  5 times sit to stand: 25.90 seconds no UE support; bilateral knee valgus 10 meter walk test: 9.25 seconds w/o AD =  1.08 m/sec OR 3.57 ft/sec  Right SLS:  3 seconds unsupported  GAIT: Distance walked: various clinic distances Assistive device utilized: None Level of assistance: Complete Independence Comments: Pt is hesitant to put weight evenly on RLE due to fear of buckling which was not noted during ambulatory and SLS tests today.  TODAY'S TREATMENT:                                                                                                                              DATE: N/A eval only.   PATIENT EDUCATION:  Education details: Provided information on [employer] to establish membership and self-progress. Person educated: Patient Education method: Explanation Education comprehension: verbalized understanding  HOME EXERCISE PROGRAM: Will review from prior episode of care.  ASSESSMENT:  CLINICAL IMPRESSION: Patient is a 47 y.o. female who was seen today for physical therapy evaluation and treatment for right knee instability.  Pt has a significant PMH of right patellofemoral ligament reconstruction 01/24/2023 and migraines.   Identified impairments include right quad weakness and decrease active knee extension.  Patient has fear avoidance tendencies with RLE weight-bearing.  Evaluation via the following assessment tools: 5xSTS and indicate mildly increased fall risk.  She would benefit from skilled PT to address impairments as noted and progress towards long term goals.  OBJECTIVE IMPAIRMENTS:  Abnormal gait, decreased knowledge of condition, and decreased strength.   ACTIVITY LIMITATIONS: squatting, stairs, transfers, and locomotion level  PARTICIPATION LIMITATIONS: driving, community activity, occupation, and yard work  PERSONAL FACTORS: Behavior pattern, Fitness, Past/current experiences, Sex, Time since onset of injury/illness/exacerbation, and 1 comorbidity: migraines  are also affecting patient's functional outcome.   REHAB POTENTIAL: Good  CLINICAL DECISION MAKING: Stable/uncomplicated  EVALUATION COMPLEXITY: Low   GOALS: Goals reviewed with patient? Yes  SHORT TERM GOALS: Target date: 02/08/2023 Pt will be independent and compliant with strengthening, stretching, and balance focused HEP in order to maintain functional progress and improve mobility. Baseline:  To be reviewed/modified. Goal status: INITIAL  2.  Pt will decrease 5xSTS to <*** seconds in order to demonstrate decreased risk for falls and improved functional bilateral LE strength and power. Baseline:  Goal status: INITIAL  3.  Pt will demonstrate a gait speed of >*** feet/sec in order to decrease risk for falls. Baseline:  Goal status: INITIAL  4.  Patient will be able to hold unsupported right SLS for >/=10 seconds in order to demonstrate improved right LE strength and static balance. Baseline:  Goal status: INITIAL  5.  *** Baseline:  Goal status: INITIAL  6.  *** Baseline:  Goal status: INITIAL  PLAN:  PT FREQUENCY: 2x/week  PT DURATION: 4 weeks (pt returns to work 02/07/2023 - currently on leave due to  migraines)  PLANNED INTERVENTIONS: {rehab planned interventions:25118::"Therapeutic exercises","Therapeutic activity","Neuromuscular re-education","Balance training","Gait training","Patient/Family education","Self Care","Joint mobilization"}  PLAN FOR NEXT SESSION: Sadie Haber, PT, DPT 01/09/2023, 4:36 PM

## 2023-01-14 ENCOUNTER — Ambulatory Visit: Payer: Medicaid Other

## 2023-01-16 ENCOUNTER — Ambulatory Visit: Payer: Medicaid Other

## 2023-01-17 ENCOUNTER — Ambulatory Visit: Payer: Medicaid Other | Admitting: Physical Therapy

## 2023-01-18 ENCOUNTER — Ambulatory Visit
Admission: RE | Admit: 2023-01-18 | Discharge: 2023-01-18 | Disposition: A | Discharge status: Home / Self Care | Payer: Medicaid Other | Source: Ambulatory Visit

## 2023-01-18 DIAGNOSIS — Z1231 Encounter for screening mammogram for malignant neoplasm of breast: Secondary | ICD-10-CM

## 2023-01-22 ENCOUNTER — Ambulatory Visit: Payer: Medicaid Other | Admitting: Physical Therapy

## 2023-01-22 ENCOUNTER — Telehealth: Payer: Self-pay | Admitting: Physical Therapy

## 2023-01-22 ENCOUNTER — Other Ambulatory Visit: Payer: Self-pay | Admitting: Endocrinology

## 2023-01-22 DIAGNOSIS — R928 Other abnormal and inconclusive findings on diagnostic imaging of breast: Secondary | ICD-10-CM

## 2023-01-22 NOTE — Telephone Encounter (Signed)
PT spoke to patient about prior missed appt and missed appt today, 7/16, at 415pm.  She states she has not received any reminder notifications and PT encouraged reliance on MyChart for reminders and provided verbal reminder of 7/18 2pm appt.  She confirms she will be at that appt.  Camille Bal, PT, DPT

## 2023-01-23 ENCOUNTER — Ambulatory Visit (INDEPENDENT_AMBULATORY_CARE_PROVIDER_SITE_OTHER): Payer: Medicaid Other | Admitting: Psychiatry

## 2023-01-23 ENCOUNTER — Telehealth: Payer: Self-pay | Admitting: Psychiatry

## 2023-01-23 ENCOUNTER — Encounter: Payer: Self-pay | Admitting: Psychiatry

## 2023-01-23 VITALS — BP 105/73 | HR 71 | Ht 68.5 in | Wt 201.6 lb

## 2023-01-23 DIAGNOSIS — G43109 Migraine with aura, not intractable, without status migrainosus: Secondary | ICD-10-CM | POA: Diagnosis not present

## 2023-01-23 DIAGNOSIS — R404 Transient alteration of awareness: Secondary | ICD-10-CM | POA: Diagnosis not present

## 2023-01-23 DIAGNOSIS — R413 Other amnesia: Secondary | ICD-10-CM | POA: Diagnosis not present

## 2023-01-23 MED ORDER — EMGALITY 120 MG/ML ~~LOC~~ SOAJ: 2.0000 | SUBCUTANEOUS | 0 refills | Status: AC

## 2023-01-23 MED ORDER — TOPIRAMATE 50 MG PO TABS: 50.0000 mg | ORAL_TABLET | Freq: Every day | ORAL | 0 refills | Status: AC

## 2023-01-23 MED ORDER — NURTEC 75 MG PO TBDP: 75.0000 mg | ORAL_TABLET | ORAL | 6 refills | Status: AC | PRN

## 2023-01-23 MED ORDER — ZOLMITRIPTAN 5 MG PO TABS: 5.0000 mg | ORAL_TABLET | ORAL | 6 refills | Status: AC | PRN

## 2023-01-23 MED ORDER — EMGALITY 120 MG/ML ~~LOC~~ SOAJ: 1.0000 | SUBCUTANEOUS | 3 refills | Status: AC

## 2023-01-23 NOTE — Progress Notes (Signed)
   CC:  headaches  Follow-up Visit  Last visit: 09/25/21  Brief HPI: 47 year old female with a history of fibroids, bipolar disorder, and migraines who follows in clinic for headaches which began in 2015 following a head trauma. CTH in 2014 was unremarkable.  At her last visit, repeat CTH was ordered. Topamax dose was increased. She was started on Zomig for rescue.  Interval History: She continues to struggle with frequent severe headaches. She is having at least 15 headache days per month. Her most severe migraine was in March of this year which required an ED visit. This migraine lasted for 2 weeks.  She is currently taking Topamax 50 mg. However she is taking this inconsistently as it makes her drowsy. Takes Zomig as needed which does generally help reduce her headache, but also makes her feel drowsy and takes ~2 hours to kick in.  She also reports concerns with her memory and will episodes of spacing out. 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 Last seizure that she knows of was at age 69, but she continues to have episodes of spacing out which concern her.  Repeat CTH was reportedly normal per patient  Migraine days per month: 15 Headache free days per month: 15  Current Headache Regimen: Preventative: Topamax 50 mg QHS Abortive: Zomig 5 mg PRN   Prior Therapies                                 Maxalt 10 mg PRN - drowsiness Zomig 5 mg PRN - drowsiness Topamax 50 mg at bedtime - drowsiness Escitalopram 5 mg   Physical Exam:   Vital Signs: BP 105/73 (BP Location: Left Arm, Patient Position: Sitting, Cuff Size: Normal)   Pulse 71   Ht 5' 8.5" (1.74 m)   Wt 201 lb 9.6 oz (91.4 kg)   LMP 07/09/2019 (Exact Date)   BMI 30.21 kg/m  GENERAL:  well appearing, in no acute distress, alert  SKIN:  Color, texture, turgor normal. No rashes or lesions HEAD:  Normocephalic/atraumatic.  NEUROLOGICAL: Mental Status: Alert, oriented to person, place and time,  Follows commands, and Speech fluent and appropriate. Cranial Nerves: PERRL, face symmetric, no dysarthria, hearing grossly intact Motor: moves all extremities equally Gait: normal-based.  IMPRESSION: 47 year old female with a history of fibroids and migraines who presents for follow up of post-traumatic migraines. She continues to have frequent headaches with Topamax, and struggles to take this consistently. Suspect this may be contributing to her cognitive issues as well. Will start Emgality for migraine prevention as she believes she would be able to take a monthly injection more consistently than a daily pill. She has been unable to tolerate Zomig or Maxalt. Will start Nurtec for rescue. EEG ordered to assess for seizure activity.  PLAN: -Prevention: Start Emgality 120 mg monthly. Continue Topamax for now, consider weaning if EEG normal -Rescue: Start Nurtec 75 mg PRN -routine EEG -B12, TSH levels   Follow-up: 4 months  I spent a total of 40 minutes on the date of the service. Headache education was done. Discussed treatment options including preventive and acute medications. Discussed medication side effects, adverse reactions and drug interactions. Written educational materials and patient instructions outlining all of the above were given.  Ocie Doyne, MD 01/23/23 8:58 AM

## 2023-01-23 NOTE — Telephone Encounter (Signed)
Hetal called from [facility]. Stated she needs to verify prescription for Galcanezumab-gnlm [facility]) 120 MG/ML SOAJ.

## 2023-01-23 NOTE — Telephone Encounter (Signed)
I called [facility], spoke Hetal, I relayed that emgality 240mg  is loading dose then every 30 days thereafter is 120mg  .  She appreciated call and verbalized understanding.

## 2023-01-23 NOTE — Patient Instructions (Addendum)
Start Emgality monthly injection for migraine prevention. Continue Topamax for now.  Start Nurtec as needed for migraines.  EEG to check for seizures  Blood work to look for causes of memory loss (thyroid and B12 levels)

## 2023-01-24 ENCOUNTER — Encounter: Payer: Self-pay | Admitting: Physical Therapy

## 2023-01-24 ENCOUNTER — Ambulatory Visit: Payer: Medicaid Other | Admitting: Physical Therapy

## 2023-01-24 DIAGNOSIS — R262 Difficulty in walking, not elsewhere classified: Secondary | ICD-10-CM

## 2023-01-24 DIAGNOSIS — M25661 Stiffness of right knee, not elsewhere classified: Secondary | ICD-10-CM

## 2023-01-24 DIAGNOSIS — M6281 Muscle weakness (generalized): Secondary | ICD-10-CM

## 2023-01-24 LAB — VITAMIN B12: Vitamin B-12: 340 pg/mL (ref 232–1245)

## 2023-01-24 LAB — TSH: TSH: 1.79 u[IU]/mL (ref 0.450–4.500)

## 2023-01-24 NOTE — Patient Instructions (Signed)
Access Code: [email or web address] URL: [email or web address] Date: 01/24/2023 Prepared by: Camille Bal  Exercises - Supine Quad Set  - 3 x daily - 7 x weekly - 2 sets - 10 reps - 3s hold - Long Sitting Calf Stretch with Strap  - 2 x daily - 7 x weekly - 2 reps - 20-30 seconds hold - Seated Hamstring Stretch  - 2 x daily - 7 x weekly - 2-3 reps - 20-30 seconds hold - Seated Hamstring Curls with Resistance  - 1 x daily - 7 x weekly - 3 sets - 10 reps - Heel Raises with Counter Support  - 1 x daily - 7 x weekly - 1-2 sets - 15 reps - Mini Squat  - 1 x daily - 7 x weekly - 2 sets - 10 reps

## 2023-01-24 NOTE — Therapy (Signed)
OUTPATIENT PHYSICAL THERAPY LOWER EXTREMITY TREATMENT   Patient Name: Sarah Barber MRN: [id number] DOB:[birth year], 47 y.o., female Today's Date: 01/24/2023  END OF SESSION:  PT End of Session - 01/24/23 1409     Visit Number 2    Number of Visits 9   8 + eval   Date for PT Re-Evaluation 02/15/23   pushed out due to patient transferring back to [facility] per preference   Authorization Type [place] MEDICAID HEALTHY BLUE    PT Start Time 1403    PT Stop Time 1445    PT Time Calculation (min) 42 min    Equipment Utilized During Treatment Gait belt    Behavior During Therapy WFL for tasks assessed/performed             Past Medical History:  Diagnosis Date   Allergies    Anxiety    Bipolar disorder (HCC)    Chronic headaches    Depression    Fatigue    Menstrual pain    Panic attacks    PTSD (post-traumatic stress disorder)    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   Vision changes    Past Surgical History:  Procedure Laterality Date   CESAREAN SECTION      x 2   HYSTERECTOMY ABDOMINAL WITH SALPINGECTOMY Bilateral 07/28/2019   Procedure: HYSTERECTOMY ABDOMINAL WITH BILATERAL SALPINGECTOMY AND RIGHT OOPHORECTOMY;  Surgeon: Reva Bores, MD;  Location: [facility];  Service: Gynecology;  Laterality: Bilateral;   KNEE ARTHROSCOPY Right 01/23/2022   Procedure: RIGHT KNEE ARTHROSCOPY WITH DEBRIDEMENT;  Surgeon: Huel Cote, MD;  Location: [facility];  Service: Orthopedics;  Laterality: Right;   KNEE RECONSTRUCTION Right 01/23/2022   Procedure: RIGHT KNEE PATELLOFEMORAL LIGAMENT RECONSTRUCTION;  Surgeon: Huel Cote, MD;  Location: [facility];  Service: Orthopedics;  Laterality: Right;   Patient Active Problem List   Diagnosis Date Noted   PVNS (pigmented villonodular synovitis)    Patellar instability of right knee    Status post hysterectomy 07/28/2019   Anemia due to acute blood loss 07/23/2019   Fibroid uterus  04/27/2019   Family history of breast cancer in first degree relative 04/27/2019   Abnormal uterine bleeding 04/27/2019    PCP: Felix Pacini, FNP  REFERRING PROVIDER: Huel Cote, MD  REFERRING DIAG: M23.51 (ICD-10-CM) - Recurrent right knee instability  THERAPY DIAG:  Stiffness of right knee, not elsewhere classified  Muscle weakness (generalized)  Difficulty in walking, not elsewhere classified  Rationale for Evaluation and Treatment: Rehabilitation  ONSET DATE: 07/20/2022  SUBJECTIVE:   SUBJECTIVE STATEMENT: Patient reports she has been having some low level spinning in her head the past 2 days that started late in the evenings.  Denies positional component stating it happens in random positions at random times and goes away on it's own.  She did have a migraine today, but this is gone now.  Her knee does not hurt, but she is generally sore from trying to workout more doing some mini squats and other light tasks.  She endorses fear of dislocating that knee as she can hear and feel a click sometimes.  She inquires if she will ever dislocate that knee again.   PERTINENT HISTORY: PF ligament reconstruction 01/23/2022, migraines  PAIN:  Are you having pain? No  PRECAUTIONS: None  WEIGHT BEARING RESTRICTIONS: No  FALLS:  Has patient fallen in last 6 months? 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 01/23/2023 for migraines  OBJECTIVE:   DIAGNOSTIC FINDINGS: No recent relevant imaging.  PATIENT SURVEYS:  LEFS 57/80  COGNITION: Overall cognitive status: Within functional limits for tasks assessed     SENSATION: WFL  EDEMA:  None noted in right compared to left knee.  POSTURE: No Significant  postural limitations  PALPATION: Not TTP around patella margins, patellar tendon, tibial tuberosity, quadriceps tendon, or over hypertrophic scarring.  LOWER EXTREMITY ROM:  Active ROM Right eval Left eval  Hip flexion    Hip extension    Hip abduction    Hip adduction    Hip internal rotation    Hip external rotation    Knee flexion    Knee extension Lacks 20 degrees actively in sitting; passive has full range no pain   Ankle dorsiflexion    Ankle plantarflexion    Ankle inversion    Ankle eversion     (Blank rows = not tested)  LOWER EXTREMITY MMT:  MMT Right eval Left eval  Hip flexion 3/5   Hip extension    Hip abduction    Hip adduction    Hip internal rotation    Hip external rotation    Knee flexion 4-/5   Knee extension 2+/5   Ankle dorsiflexion 5/5   Ankle plantarflexion    Ankle inversion    Ankle eversion     (Blank rows = not tested)  LOWER EXTREMITY SPECIAL TESTS:  Knee special tests: patellar glide test:  negative on the right, patient has fingertip width more lateral translation on the left patella than right  FUNCTIONAL TESTS:  5 times sit to stand: 25.90 seconds no UE support; bilateral knee valgus 10 meter walk test: 9.25 seconds w/o AD =  1.08 m/sec OR 3.57 ft/sec  Right SLS:  3 seconds unsupported  GAIT: Distance walked: various clinic distances Assistive device utilized: None Level of assistance: Complete Independence Comments: Pt is hesitant to put weight evenly on RLE due to fear of buckling which was not noted during ambulatory and SLS tests today.  TODAY'S TREATMENT:                                                                                                                              DATE: 01/24/2023 -Answers questions about clicking in knee and ever dislocating knee again with PT explaining that there is no guarantee that injury would not reoccur, but with good retraining over time and goal of ideal patellar tracking  post-surgically the risk is lowered.  Deferred further to surgeon. -SciFit x8 minutes in hill mode on level 4.0 using BUE/BLE for reciprocal mobility, strength, and general activity tolerance.  Cued to pace at a 6/10 level  of work. -Assessed BP as pt states her spinning is back during session: 116/78, HR 91 bpm  Reviewed/modified HEP: -Supine right quad set x10 w/ 3 sec hold, encouraged pt to do these before higher level activity to prime quad and establish confidence in kneecap tracking -Supine ankle pumps bilaterally x20, removed due to patient level -Supine heel slide x10 RLE> replaced w/ seated hamstring curls w/ band x15 RLE -Long-sitting calf stretch w/ strap RLE only 2x45 seconds -Replaced long-sitting PF w/ resistance w/ heel raises in standing x15 -Removed passive and AROM knee exercises due to patient functional level. -Seated hamstring stretch 2x45 seconds RLE -Heel raises at counter x15, focused on eccentric 3-count lower -mini squat x10, cued to engage gluts and quads on standing vs relying on UE support  PATIENT EDUCATION:  Education details: Discussed [employer] vs [employer] vs [employer] vs other gym setting and establishing membership.  She is limited financially by being out on short term disability due to ongoing migraines. Person educated: Patient Education method: Explanation Education comprehension: verbalized understanding  HOME EXERCISE PROGRAM: [id number] - from prior DB episode of care; reviewed and modified 7/18  ASSESSMENT:  CLINICAL IMPRESSION: Focus of skilled PT session today on reviewing and modifying HEP to meet patient's current functional level.  She moves well and has no lingering restrictions, but remains concerned about recurrence of injury.  Education and reassurance provided for improved confidence of movement.  PT to continue POC, but patient transferring to [facility].    OBJECTIVE IMPAIRMENTS: Abnormal gait, decreased knowledge of condition, and  decreased strength.   ACTIVITY LIMITATIONS: squatting, stairs, transfers, and locomotion level  PARTICIPATION LIMITATIONS: driving, community activity, occupation, and yard work  PERSONAL FACTORS: Behavior pattern, Fitness, Past/current experiences, Sex, Time since onset of injury/illness/exacerbation, and 1 comorbidity: migraines  are also affecting patient's functional outcome.   REHAB POTENTIAL: Good  CLINICAL DECISION MAKING: Stable/uncomplicated  EVALUATION COMPLEXITY: Low   GOALS: Goals reviewed with patient? Yes  SHORT TERM GOALS: Target date: 02/08/2023 Pt will be independent and compliant with strengthening, stretching, and balance focused HEP in order to maintain functional progress and improve mobility. Baseline:  To be reviewed/modified. Goal status: INITIAL  2.  Pt will decrease 5xSTS to </=20 seconds in order to demonstrate decreased risk for falls and improved functional bilateral LE strength and power. Baseline: 25.90 seconds no UE support Goal status: INITIAL  3.  Pt will demonstrate a gait speed of >/=1.30 m/sec in order to decrease risk for falls. Baseline: 1.08 m/sec Goal status: INITIAL  4.  Patient will be able to hold unsupported right SLS for >/=10 seconds in order to demonstrate improved right LE strength and static balance. Baseline: 3 seconds Goal status: INITIAL  PLAN:  PT FREQUENCY: 2x/week  PT DURATION: 4 weeks (pt returns to work 02/07/2023 - currently on leave due to migraines)  PLANNED INTERVENTIONS: Therapeutic exercises, Therapeutic activity, Neuromuscular re-education, Balance training, Gait training, Patient/Family education, Self Care, Joint mobilization, Stair training, Electrical stimulation, Cryotherapy, Moist heat, scar mobilization, Taping, Ultrasound, Manual therapy, and Re-evaluation  PLAN FOR NEXT SESSION: Modify HEP prn.  Has patient established gym membership?  Work on [employer], quad control.  Lateral hip strengthening.   Treadmill training may be beneficial.  Check all possible CPT codes: 54098 - PT Re-evaluation, 97110- Therapeutic Exercise, [phone]- Neuro Re-education, [phone] - Gait Training, [phone] - Manual Therapy, 97530 - Therapeutic Activities, 97535 - Self Care, [phone] - Electrical stimulation (Manual), and [id number] - Ultrasound    Check all  conditions that are expected to impact treatment: Musculoskeletal disorders and Neurological condition and/or seizures   If treatment provided at initial evaluation, no treatment charged due to lack of authorization.   Sadie Haber, PT, DPT 01/24/2023, 3:10 PM

## 2023-01-25 ENCOUNTER — Telehealth: Payer: Self-pay

## 2023-01-25 ENCOUNTER — Other Ambulatory Visit (HOSPITAL_COMMUNITY): Payer: Self-pay

## 2023-01-25 NOTE — Telephone Encounter (Signed)
Pharmacy Patient Advocate Encounter   Received notification from CoverMyMeds that prior authorization for Nurtec 75MG  dispersible tablets is required/requested.   Insurance verification completed.   The patient is insured through [facility] .   Per test claim: PA submitted to [facility] via CoverMyMeds Key/confirmation #/EOC [id number] Status is pending

## 2023-01-25 NOTE — Telephone Encounter (Signed)
Pharmacy Patient Advocate Encounter   Received notification from CoverMyMeds that prior authorization for Emgality 120MG /ML auto-injectors (migraine) is required/requested.   Insurance verification completed.   The patient is insured through [facility] .   Per test claim: PA submitted to [facility] via CoverMyMeds Key/confirmation #/EOC [id number] Status is pending

## 2023-01-25 NOTE — Telephone Encounter (Signed)
Pharmacy Patient Advocate Encounter  Received notification from [facility] that Prior Authorization for Emgality 120MG /ML auto-injectors (migraine)has been APPROVED from 01/25/2023 to 04/25/2023.Marland Kitchen  PA #/Case ID/Reference #: PA Case: [id number]   Copay is $4.00 for the loading dose per [facility] test claim.

## 2023-01-28 ENCOUNTER — Ambulatory Visit: Payer: Medicaid Other

## 2023-01-28 ENCOUNTER — Ambulatory Visit: Admission: RE | Admit: 2023-01-28 | Payer: Medicaid Other | Source: Ambulatory Visit

## 2023-01-28 DIAGNOSIS — R928 Other abnormal and inconclusive findings on diagnostic imaging of breast: Secondary | ICD-10-CM

## 2023-01-29 ENCOUNTER — Ambulatory Visit: Payer: Medicaid Other | Admitting: Physical Therapy

## 2023-01-29 NOTE — Telephone Encounter (Signed)
Pharmacy Patient Advocate Encounter  Received notification from [facility] that Prior Authorization for Nurtec 75MG  dispersible tablets has been DENIED. Please advise how you'd like to proceed. Full denial letter will be uploaded to the media tab. See denial reason below.  This is denied for the following reason: because we did not see what we need to approve the drug you asked for. We may be able to approve this drug in a certain situation (when you have had a headache frequency of less than 15 headache days per month during the prior 6 months).   PA #/Case ID/Reference #: [id number]  Please be advised we currently do not have a Pharmacist to review denials, therefore you will need to process appeals accordingly as needed. Thanks for your support at this time. Contact for appeals are as follows: Phone: [phone], Fax: n/a

## 2023-01-30 ENCOUNTER — Other Ambulatory Visit: Payer: Self-pay | Admitting: Psychiatry

## 2023-01-30 NOTE — Telephone Encounter (Signed)
I spoke with the patient and told her to continue on Zomig for now. I requested that she call us if her headache days reduce to less than 15 days per month. She verbalized understanding and expressed appreciation for the call.

## 2023-01-30 NOTE — Telephone Encounter (Signed)
We can continue with her Zomig for now, and can try again for Nurtec if her headache frequency decreases to less than 15 per month

## 2023-01-31 ENCOUNTER — Institutional Professional Consult (permissible substitution): Payer: Medicaid Other | Admitting: Psychiatry

## 2023-01-31 ENCOUNTER — Ambulatory Visit: Payer: Medicaid Other | Admitting: Neurology

## 2023-01-31 ENCOUNTER — Ambulatory Visit: Payer: Medicaid Other | Admitting: Physical Therapy

## 2023-01-31 DIAGNOSIS — R404 Transient alteration of awareness: Secondary | ICD-10-CM

## 2023-01-31 DIAGNOSIS — R4182 Altered mental status, unspecified: Secondary | ICD-10-CM

## 2023-02-04 ENCOUNTER — Institutional Professional Consult (permissible substitution): Payer: Medicaid Other | Admitting: Psychiatry

## 2023-02-05 ENCOUNTER — Ambulatory Visit: Payer: Medicaid Other | Admitting: Physical Therapy

## 2023-02-05 NOTE — Procedures (Signed)
    History:  48 year old woman with transient alteration of awareness  EEG classification:  Awake and asleep  Description of the recording: The background rhythms of this recording consists of a fairly well modulated medium amplitude background activity of 10 Hz. As the record progresses, the patient initially is in the waking state, but appears to enter the early stage II sleep during the recording, with rudimentary sleep spindles and vertex sharp wave activity seen. During the wakeful state, photic stimulation is performed, and no abnormal responses were seen. Hyperventilation was also performed, no abnormal response seen. No epileptiform discharges seen during this recording. There was no focal slowing.   Abnormality: None   Impression: This is a normal EEG recording in the waking and sleeping state. No evidence of interictal epileptiform discharges. Normal EEGs, however, do not rule out epilepsy.    Windell Norfolk, MD [facility]

## 2023-02-07 ENCOUNTER — Ambulatory Visit: Payer: Medicaid Other | Admitting: Physical Therapy

## 2023-02-12 ENCOUNTER — Ambulatory Visit: Payer: Medicaid Other | Admitting: Physical Therapy

## 2023-02-13 ENCOUNTER — Ambulatory Visit (HOSPITAL_BASED_OUTPATIENT_CLINIC_OR_DEPARTMENT_OTHER): Payer: Medicaid Other | Attending: Orthopaedic Surgery | Admitting: Physical Therapy

## 2023-02-13 ENCOUNTER — Encounter (HOSPITAL_BASED_OUTPATIENT_CLINIC_OR_DEPARTMENT_OTHER): Payer: Self-pay | Admitting: Physical Therapy

## 2023-02-13 DIAGNOSIS — M25661 Stiffness of right knee, not elsewhere classified: Secondary | ICD-10-CM | POA: Diagnosis present

## 2023-02-13 DIAGNOSIS — M25561 Pain in right knee: Secondary | ICD-10-CM | POA: Insufficient documentation

## 2023-02-13 DIAGNOSIS — R262 Difficulty in walking, not elsewhere classified: Secondary | ICD-10-CM | POA: Insufficient documentation

## 2023-02-13 DIAGNOSIS — M6281 Muscle weakness (generalized): Secondary | ICD-10-CM | POA: Diagnosis present

## 2023-02-13 NOTE — Therapy (Signed)
OUTPATIENT PHYSICAL THERAPY LOWER EXTREMITY TREATMENT   Patient Name: Sarah Barber MRN: [id number] DOB:1976-05-06, 47 y.o., female Today's Date: 02/14/2023  END OF SESSION:  PT End of Session - 02/13/23 1540     Visit Number 3    Number of Visits 9    Date for PT Re-Evaluation 02/15/23    Authorization Type [place] MEDICAID HEALTHY BLUE    PT Start Time 1535    PT Stop Time 1615    PT Time Calculation (min) 40 min    Activity Tolerance Patient tolerated treatment well    Behavior During Therapy WFL for tasks assessed/performed              Past Medical History:  Diagnosis Date   Allergies    Anxiety    Bipolar disorder (HCC)    Chronic headaches    Depression    Fatigue    Menstrual pain    Panic attacks    PTSD (post-traumatic stress disorder)    Seizure (HCC)    as a child - last one at age 81- petite mal   Vision changes    Past Surgical History:  Procedure Laterality Date   CESAREAN SECTION      x 2   HYSTERECTOMY ABDOMINAL WITH SALPINGECTOMY Bilateral 07/28/2019   Procedure: HYSTERECTOMY ABDOMINAL WITH BILATERAL SALPINGECTOMY AND RIGHT OOPHORECTOMY;  Surgeon: Reva Bores, MD;  Location: [facility];  Service: Gynecology;  Laterality: Bilateral;   KNEE ARTHROSCOPY Right 01/23/2022   Procedure: RIGHT KNEE ARTHROSCOPY WITH DEBRIDEMENT;  Surgeon: Huel Cote, MD;  Location: [facility];  Service: Orthopedics;  Laterality: Right;   KNEE RECONSTRUCTION Right 01/23/2022   Procedure: RIGHT KNEE PATELLOFEMORAL LIGAMENT RECONSTRUCTION;  Surgeon: Huel Cote, MD;  Location: [facility];  Service: Orthopedics;  Laterality: Right;   Patient Active Problem List   Diagnosis Date Noted   PVNS (pigmented villonodular synovitis)    Patellar instability of right knee    Status post hysterectomy 07/28/2019   Anemia due to acute blood loss 07/23/2019   Fibroid uterus 04/27/2019   Family history of breast cancer in first degree relative  04/27/2019   Abnormal uterine bleeding 04/27/2019    PCP: Felix Pacini, FNP  REFERRING PROVIDER: Huel Cote, MD  REFERRING DIAG: M23.51 (ICD-10-CM) - Recurrent right knee instability  THERAPY DIAG:  Muscle weakness (generalized)  Difficulty in walking, not elsewhere classified  Stiffness of right knee, not elsewhere classified  Rationale for Evaluation and Treatment: Rehabilitation  ONSET DATE: 07/20/2022  SUBJECTIVE:   SUBJECTIVE STATEMENT: Patient denies pain currently.  She reports having stiffness.  She complains of her knee wanting to buckle with ambulation which occurs daily.  Pt has been having some discomfort and a little stiffness after PT treatments.  Pt was doing some exercises though stopped due to feeling her knee clicking.  Pt states she feels more comfortable at [facility] due to being here last year and wanted to return.     PERTINENT HISTORY: PF ligament reconstruction 01/23/2022, migraines  PAIN:  Are you having pain? No  PRECAUTIONS: None  WEIGHT BEARING RESTRICTIONS: No  FALLS:  Has patient fallen in last 6 months? 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 01/23/2023 for migraines  OBJECTIVE:   DIAGNOSTIC FINDINGS: No recent relevant imaging.    TODAY'S TREATMENT:                                                                                                                               R knee flexion AROM:  135 deg.  Nustep L3 just LE's x 5 mins Mini squats 2x10 with bilat Ue's on rail Standing heel raises with bilat Ue's on rail 2x10 Cybex leg press 30# 2x12 Step ups 4 inch step with rail 2x10 Pt unable to perform SAQ Attempted LAQ though is very limited Seated  HS curl with RTB 2x10 Supine SLR x 7 reps and x 10 reps with PT assistance   PATIENT EDUCATION:  Education details: Discussed with pt about using an AD such as a cane due to weakness and having feelings of knee wanting to buckle.  Exercise form, relevant anatomy, POC, and rationale of exercises Person educated: Patient Education method: Explanation, demonstration, verbal cues, tactile cues Education comprehension: verbalized understanding, returned demonstration, verbal and tactile cues required  HOME EXERCISE PROGRAM: [id number] - from prior DB episode of care; reviewed and modified 7/18  ASSESSMENT:  CLINICAL IMPRESSION: Pt returned to [facility] today stating she felt more comfortable being here due to this is where she received prior PT.  Pt has significant quad weakness and c/o's of feeling that her knee may buckle with gait daily.  PT discussed using an AD with pt due to quad weakness and feeling her knee wanting to buckle.  Pt is unable to perform a SAQ and is unable to perform supine SLR without assistance.  She has much difficulty with LAQ and is very limited.  Pt requires cuing for correct form with squats and has increased Wb'ing thru L LE with squats.  Pt requires cuing and instruction for correct alignment of knee with exercises including to decrease knee IR.  Pt has excellent knee flexion AROM.  Pt responded well to Rx having no c/o's after Rx.  Pt should benefit from cont skilled PT services to improve strength, gait, mobility, and function.      OBJECTIVE IMPAIRMENTS: Abnormal gait, decreased knowledge of condition, and decreased strength.   ACTIVITY LIMITATIONS: squatting, stairs, transfers, and locomotion level  PARTICIPATION LIMITATIONS: driving, community activity, occupation, and yard work  PERSONAL FACTORS: Behavior pattern, Fitness, Past/current experiences, Sex, Time since onset of injury/illness/exacerbation, and 1 comorbidity: migraines  are also affecting patient's  functional outcome.   REHAB POTENTIAL: Good  CLINICAL DECISION MAKING: Stable/uncomplicated  EVALUATION COMPLEXITY: Low   GOALS: Goals reviewed with patient? Yes  SHORT TERM GOALS: Target date: 02/08/2023 Pt will be independent and compliant with strengthening, stretching, and balance focused HEP in order to maintain functional progress and improve mobility. Baseline:  To be reviewed/modified. Goal status: INITIAL  2.  Pt will decrease 5xSTS to </=20 seconds in order to demonstrate decreased risk for falls and improved functional bilateral LE strength and  power. Baseline: 25.90 seconds no UE support Goal status: INITIAL  3.  Pt will demonstrate a gait speed of >/=1.30 m/sec in order to decrease risk for falls. Baseline: 1.08 m/sec Goal status: INITIAL  4.  Patient will be able to hold unsupported right SLS for >/=10 seconds in order to demonstrate improved right LE strength and static balance. Baseline: 3 seconds Goal status: INITIAL  PLAN:  PT FREQUENCY: 2x/week  PT DURATION: 4 weeks (pt returns to work 02/07/2023 - currently on leave due to migraines)  PLANNED INTERVENTIONS: Therapeutic exercises, Therapeutic activity, Neuromuscular re-education, Balance training, Gait training, Patient/Family education, Self Care, Joint mobilization, Stair training, Electrical stimulation, Cryotherapy, Moist heat, scar mobilization, Taping, Ultrasound, Manual therapy, and Re-evaluation  PLAN FOR NEXT SESSION: Modify HEP prn.  Cont with hip and knee strengthening.  Work on [employer], quad control.        Audie Clear III PT, DPT 02/14/23 11:34 PM

## 2023-02-14 ENCOUNTER — Ambulatory Visit: Payer: Medicaid Other | Admitting: Physical Therapy

## 2023-02-15 ENCOUNTER — Ambulatory Visit (HOSPITAL_BASED_OUTPATIENT_CLINIC_OR_DEPARTMENT_OTHER): Payer: Medicaid Other | Admitting: Physical Therapy

## 2023-02-19 ENCOUNTER — Ambulatory Visit: Payer: Medicaid Other | Admitting: Physical Therapy

## 2023-02-19 ENCOUNTER — Ambulatory Visit (HOSPITAL_BASED_OUTPATIENT_CLINIC_OR_DEPARTMENT_OTHER): Payer: Medicaid Other | Admitting: Physical Therapy

## 2023-02-19 DIAGNOSIS — M25561 Pain in right knee: Secondary | ICD-10-CM

## 2023-02-19 DIAGNOSIS — M6281 Muscle weakness (generalized): Secondary | ICD-10-CM | POA: Diagnosis not present

## 2023-02-19 DIAGNOSIS — R262 Difficulty in walking, not elsewhere classified: Secondary | ICD-10-CM

## 2023-02-19 NOTE — Therapy (Addendum)
 OUTPATIENT PHYSICAL THERAPY LOWER EXTREMITY TREATMENT / PROGRESS NOTE   Patient Name: Sarah Barber MRN: [id number] DOB:09-29-1975, 47 y.o., female Today's Date: 02/20/2023  END OF SESSION:  PT End of Session - 02/19/23 1537     Visit Number 4    Number of Visits 16    Date for PT Re-Evaluation 04/02/23    Authorization Type [place] MEDICAID HEALTHY BLUE    PT Start Time 1448    PT Stop Time 1533    PT Time Calculation (min) 45 min    Activity Tolerance Patient tolerated treatment well    Behavior During Therapy WFL for tasks assessed/performed               Past Medical History:  Diagnosis Date   Allergies    Anxiety    Bipolar disorder (HCC)    Chronic headaches    Depression    Fatigue    Menstrual pain    Panic attacks    PTSD (post-traumatic stress disorder)    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   Vision changes    Past Surgical History:  Procedure Laterality Date   CESAREAN SECTION      x 2   HYSTERECTOMY ABDOMINAL WITH SALPINGECTOMY Bilateral 07/28/2019   Procedure: HYSTERECTOMY ABDOMINAL WITH BILATERAL SALPINGECTOMY AND RIGHT OOPHORECTOMY;  Surgeon: Fredirick Glenys RAMAN, MD;  Location: [facility];  Service: Gynecology;  Laterality: Bilateral;   KNEE ARTHROSCOPY Right 01/23/2022   Procedure: RIGHT KNEE ARTHROSCOPY WITH DEBRIDEMENT;  Surgeon: Genelle Standing, MD;  Location: [facility];  Service: Orthopedics;  Laterality: Right;   KNEE RECONSTRUCTION Right 01/23/2022   Procedure: RIGHT KNEE PATELLOFEMORAL LIGAMENT RECONSTRUCTION;  Surgeon: Genelle Standing, MD;  Location: [facility];  Service: Orthopedics;  Laterality: Right;   Patient Active Problem List   Diagnosis Date Noted   PVNS (pigmented villonodular synovitis)    Patellar instability of right knee    Status post hysterectomy 07/28/2019   Anemia due to acute blood loss 07/23/2019   Fibroid uterus 04/27/2019   Family history of breast cancer in first  degree relative 04/27/2019   Abnormal uterine bleeding 04/27/2019    PCP: Elizbeth Leita Ruth, FNP  REFERRING PROVIDER: Genelle Standing, MD  REFERRING DIAG: M23.51 (ICD-10-CM) - Recurrent right knee instability  THERAPY DIAG:  Muscle weakness (generalized)  Difficulty in walking, not elsewhere classified  Right knee pain, unspecified chronicity  Rationale for Evaluation and Treatment: Rehabilitation  ONSET DATE: 07/20/2022  SUBJECTIVE:   SUBJECTIVE STATEMENT: Patient has been out of work for migraines since March.  She returns to work on Monday and she works from home.  Pt reports reduced knee buckling.  Pt walked a long distance today and had 1 buckling event of knee.  Her knee wants to buckle daily though has improved. Pt states she can squat as long as she is close to an externals support.  Pt states she is limited with extending knee against gravity.  Pt doesn't have a car.  She states when she had access to a car, she wouldn't drive it due to her knee though she feels like she can now.  Pt has difficulty with and is fearful with descending stairs.  PERTINENT HISTORY: PF ligament reconstruction 01/23/2022, migraines  PAIN:  Location:  superior, medial, and lateral R knee NPRS:  0/10 current and best, 7-8/10 worst  PRECAUTIONS: None  WEIGHT BEARING RESTRICTIONS: No  FALLS:  Has patient fallen  in last 6 months? 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 Rush 01/23/2023 for migraines  OBJECTIVE:   DIAGNOSTIC FINDINGS: No recent relevant imaging.    TODAY'S TREATMENT:                                                                                                                               R knee AROM:  0 -  135 deg. LAQ:  R:  lacks 25 deg, L:  lacks 3 deg LE strength: R Hip:  flex:  3+/5; 35.3  abd:  4/5 L hip:  flex:  35.8 R knee:  ext:  unable to tolerate resistance; 10.8 L knee:  ext:  38.8  Gait:  improved Wb'ing thru R LE though does favor R LE still.    LEFS:  52/80  FUNCTIONAL TESTS:  5 times sit to stand:  Initial:  25.90 / 23.55 seconds no UE support; bilateral knee valgus Right SLS: Initial/Current:  3 / 25 seconds unsupported  10 meter walk test:  Initial:  9.25 seconds w/o AD =  1.08 m/sec OR 3.57 ft/sec                    Current: 8.9 sec =  1.12 m/sec  Pt performed: Mini squats x10 with bilat Ue's on rail Cybex leg press 30# x15 Step ups 4 inch step with rail 2x10   PATIENT EDUCATION:  Education details: objective findings, progress and deficits, exercise form, relevant anatomy, POC, and rationale of exercises Person educated: Patient Education method: Explanation, demonstration, verbal cues, tactile cues Education comprehension: verbalized understanding, returned demonstration, verbal and tactile cues required  HOME EXERCISE PROGRAM: [id number] - from prior DB episode of care; reviewed and modified 7/18  ASSESSMENT:  CLINICAL IMPRESSION: Pt has only received 3 PT visits prior to today.  Pt is making progress.  She demonstrates significant improvement in SLS and minimal improvement in 5x STS test and gait speed.  Pt demonstrates improved quality of gait.  She normal knee AROM.  Pt has weakness in R LE including significant weakness in R quad.  Pt is unable to perform supine SLR without assistance and is limited with LAQ AROM.  Pt reports reduced knee buckling though continues to experience knee buckling daily.  Pt met STG #4 and is progressing toward STG's #2,3.  Pt should benefit from cont skilled PT services to improve strength, gait, mobility, and function.        OBJECTIVE IMPAIRMENTS: Abnormal gait, decreased knowledge of condition, and decreased strength.    ACTIVITY LIMITATIONS: squatting, stairs, transfers, and locomotion level  PARTICIPATION LIMITATIONS: driving, community activity, occupation, and yard work  PERSONAL FACTORS: Behavior pattern, Fitness, Past/current experiences,  Sex, Time since onset of injury/illness/exacerbation, and 1 comorbidity: migraines are also affecting patient's functional outcome.   REHAB POTENTIAL: Good  CLINICAL DECISION MAKING: Stable/uncomplicated  EVALUATION COMPLEXITY: Low   GOALS: Goals reviewed with patient? Yes  SHORT TERM GOALS: Target date: 03/12/2023  Pt will be independent and compliant with strengthening, stretching, and balance focused HEP in order to maintain functional progress and improve mobility. Baseline:  To be reviewed/modified. Goal status: ONGOING  2.  Pt will decrease 5xSTS to </=20 seconds in order to demonstrate decreased risk for falls and improved functional bilateral LE strength and power. Baseline: 25.90 seconds no UE support Goal status: PROGRESSING  8/13  3.  Pt will demonstrate a gait speed of >/=1.30 m/sec in order to decrease risk for falls. Baseline: 1.08 m/sec Goal status: PROGRESSING  8/13  4.  Patient will be able to hold unsupported right SLS for >/=10 seconds in order to demonstrate improved right LE strength and static balance. Baseline: 3 seconds Goal status: GOAL MET  8/13  LONG TERM GOALS:  Target date:  04/02/2023   1. Pt will report at least a 75% improvement in feelings of knee buckling with mobility.  Baseline:  Goal status: INITIAL  2.  Pt will demo improved R hip flexion strength to at least 4+/5 and knee extension strength to > 25 # for improved performance of and tolerance with functional mobility.   Baseline:  Goal status: INITIAL    PLAN:  PT FREQUENCY: 2x/week  PT DURATION: 6 weeks  PLANNED INTERVENTIONS: Therapeutic exercises, Therapeutic activity, Neuromuscular re-education, Balance training, Gait training, Patient/Family  education, Self Care, Joint mobilization, Stair training, Electrical stimulation, Cryotherapy, Moist heat, scar mobilization, Taping, Ultrasound, Manual therapy, and Re-evaluation  PLAN FOR NEXT SESSION: Modify HEP prn.  Cont with hip and knee strengthening.  Work on Bj's, quad control.      Check all possible CPT codes: 02835 - PT Re-evaluation, 97110- Therapeutic Exercise, [phone]- Neuro Re-education, [phone] - Gait Training, [phone] - Manual Therapy, 97530 - Therapeutic Activities, [phone] - Self Care, [phone] - Electrical stimulation (unattended), [id number] - Ultrasound, [id number] - Physical performance training, and [phone] - Aquatic therapy    Check all conditions that are expected to impact treatment: {Conditions expected to impact treatment:None of these apply   If treatment provided at initial evaluation, no treatment charged due to lack of authorization.      Leigh Minerva III PT, DPT 02/20/23 10:51 PM  PHYSICAL THERAPY DISCHARGE SUMMARY  Visits from Start of Care: 4  Current functional level related to goals / functional outcomes: Unable to assess current functional status or goals due to pt not being present at discharge.    Remaining deficits: Unable to assess due to pt not being present at discharge.    Education / Equipment:    Patient was seen in PT from 01/09/23 - 02/19/23.  She then cancelled and No-showed her following appointments.  Pt will be considered discharged at this time due to non-compliance to attendance.  Leigh Minerva III PT, DPT 05/11/24 11:43 AM

## 2023-02-20 ENCOUNTER — Encounter (HOSPITAL_BASED_OUTPATIENT_CLINIC_OR_DEPARTMENT_OTHER): Payer: Self-pay | Admitting: Physical Therapy

## 2023-02-21 ENCOUNTER — Ambulatory Visit (HOSPITAL_BASED_OUTPATIENT_CLINIC_OR_DEPARTMENT_OTHER): Payer: Medicaid Other | Admitting: Physical Therapy

## 2023-02-21 ENCOUNTER — Ambulatory Visit: Payer: Medicaid Other | Admitting: Physical Therapy

## 2023-02-26 ENCOUNTER — Ambulatory Visit (HOSPITAL_BASED_OUTPATIENT_CLINIC_OR_DEPARTMENT_OTHER): Payer: Medicaid Other | Admitting: Physical Therapy

## 2023-02-28 ENCOUNTER — Ambulatory Visit (HOSPITAL_BASED_OUTPATIENT_CLINIC_OR_DEPARTMENT_OTHER): Payer: Medicaid Other | Admitting: Physical Therapy

## 2023-03-01 ENCOUNTER — Telehealth (HOSPITAL_BASED_OUTPATIENT_CLINIC_OR_DEPARTMENT_OTHER): Payer: Self-pay | Admitting: Physical Therapy

## 2023-03-01 NOTE — Telephone Encounter (Signed)
PT attempted to call pt after her 2nd No-show though the call would not go through.  Audie Clear III PT, DPT 03/01/23 3:02 PM

## 2023-03-05 ENCOUNTER — Ambulatory Visit (HOSPITAL_BASED_OUTPATIENT_CLINIC_OR_DEPARTMENT_OTHER): Payer: Medicaid Other | Admitting: Physical Therapy

## 2023-03-07 ENCOUNTER — Ambulatory Visit (HOSPITAL_BASED_OUTPATIENT_CLINIC_OR_DEPARTMENT_OTHER): Payer: Medicaid Other | Admitting: Physical Therapy

## 2023-03-22 ENCOUNTER — Ambulatory Visit (HOSPITAL_BASED_OUTPATIENT_CLINIC_OR_DEPARTMENT_OTHER): Payer: Medicaid Other | Admitting: Student

## 2023-04-22 ENCOUNTER — Telehealth: Payer: Self-pay

## 2023-04-22 ENCOUNTER — Other Ambulatory Visit (HOSPITAL_COMMUNITY): Payer: Self-pay

## 2023-04-22 NOTE — Telephone Encounter (Signed)
*  [facility]   Prior Authorization form/request asks a question that requires your assistance. Please see the question below and advise accordingly.    Has the patient demonstrated significant decrease in the number, frequency, and/or intensity of headaches?   CMM Key: [facility]

## 2023-04-29 ENCOUNTER — Other Ambulatory Visit (HOSPITAL_COMMUNITY): Payer: Self-pay

## 2023-05-15 NOTE — Telephone Encounter (Signed)
Called pt because I got a message that she did not read mychart message. She confirmed she never picked up Emgality. She would like to try and start on this for migraine management. Confirmed she still have [employer].  Feels migraines continue to progress/worsen. She is at at work. Working on Insurance underwriter.   Aware I will send message back to PA team to try and do PA for initiation of Emgality since she never started on it.  She takes zomig prn but feels its not as effective as it once was.

## 2023-05-16 ENCOUNTER — Other Ambulatory Visit (HOSPITAL_COMMUNITY): Payer: Self-pay

## 2023-05-16 NOTE — Telephone Encounter (Signed)
Pharmacy Patient Advocate Encounter  Received notification from [facility] that Prior Authorization for Emgality 120MG /ML auto-injectors (migraine) has been APPROVED from 05/16/2023 to 08/14/2023. Ran test claim, Copay is $4.00. This test claim was processed through [facility]- copay amounts may vary at other pharmacies due to pharmacy/plan contracts, or as the patient moves through the different stages of their insurance plan.   PA #/Case ID/Reference #: PA Case: [id number]

## 2023-05-16 NOTE — Telephone Encounter (Signed)
Pharmacy Patient Advocate Encounter   Received notification from Physician's Office that prior authorization for Emgality 120MG /ML auto-injectors (migraine) is required/requested.   Insurance verification completed.   The patient is insured through [facility] .   Per test claim: PA required; PA submitted to above mentioned insurance via CoverMyMeds Key/confirmation #/EOC [id number] Status is pending  This is for the 2ml loading dose.

## 2023-05-16 NOTE — Telephone Encounter (Signed)
Called and LVM for pt letting her know Emgality approved and she should be able to pick this up from her pharmacy. Asked her to call back if she has any trouble picking this up.

## 2023-07-26 ENCOUNTER — Telehealth: Payer: Medicaid Other | Admitting: Physician Assistant

## 2023-07-26 DIAGNOSIS — A084 Viral intestinal infection, unspecified: Secondary | ICD-10-CM

## 2023-07-26 MED ORDER — LOPERAMIDE HCL 2 MG PO TABS
2.0000 mg | ORAL_TABLET | Freq: Four times a day (QID) | ORAL | 0 refills | Status: AC | PRN
Start: 2023-07-26 — End: ?

## 2023-07-26 MED ORDER — ONDANSETRON 4 MG PO TBDP
4.0000 mg | ORAL_TABLET | Freq: Three times a day (TID) | ORAL | 0 refills | Status: AC | PRN
Start: 2023-07-26 — End: ?

## 2023-07-26 NOTE — Patient Instructions (Signed)
Sarah Barber, thank you for joining Margaretann Loveless, PA-C for today's virtual visit.  While this provider is not your primary care provider (PCP), if your PCP is located in our provider database this encounter information will be shared with them immediately following your visit.   A [facility] MyChart account gives you access to today's visit and all your visits, tests, and labs performed at [facility] " click here if you don't have a [facility] MyChart account or go to [email or web address]  Consent: (Patient) Sarah Barber provided verbal consent for this virtual visit at the beginning of the encounter.  Current Medications:  Current Outpatient Medications:    loperamide (IMODIUM A-D) 2 MG tablet, Take 1 tablet (2 mg total) by mouth 4 (four) times daily as needed for diarrhea or loose stools., Disp: 30 tablet, Rfl: 0   ondansetron (ZOFRAN-ODT) 4 MG disintegrating tablet, Take 1-2 tablets (4-8 mg total) by mouth every 8 (eight) hours as needed., Disp: 20 tablet, Rfl: 0   traMADol (ULTRAM) 50 MG tablet, Take 1 tablet (50 mg total) by mouth every 6 (six) hours as needed. (Patient not taking: Reported on 01/23/2023), Disp: 30 tablet, Rfl: 0   albuterol (VENTOLIN HFA) 108 (90 Base) MCG/ACT inhaler, Inhale 2 puffs into the lungs every 6 (six) hours as needed for wheezing or shortness of breath., Disp: 8 g, Rfl: 0   aspirin EC 325 MG tablet, Take 1 tablet (325 mg total) by mouth daily., Disp: 30 tablet, Rfl: 0   benzonatate (TESSALON) 100 MG capsule, Take 1 capsule (100 mg total) by mouth 3 (three) times daily as needed for cough. (Patient not taking: Reported on 01/23/2023), Disp: 30 capsule, Rfl: 0   butalbital-acetaminophen-caffeine (FIORICET) 50-325-40 MG tablet, Take by mouth 2 (two) times daily as needed for headache. (Patient not taking: Reported on 01/23/2023), Disp: , Rfl:    COVID-19 At-Home Test KIT, Use to test for COVID, Disp: 1 kit, Rfl: 0   Galcanezumab-gnlm  (EMGALITY) 120 MG/ML SOAJ, Inject 2 Pens into the skin every 30 (thirty) days., Disp: 2.24 mL, Rfl: 0   Galcanezumab-gnlm (EMGALITY) 120 MG/ML SOAJ, Inject 1 Pen into the skin every 30 (thirty) days., Disp: 1.12 mL, Rfl: 3   lidocaine (LIDODERM) 5 %, Place 1 patch onto the skin daily. Remove & Discard patch within 12 hours or as directed by MD (Patient not taking: Reported on 01/23/2023), Disp: 30 patch, Rfl: 0   oxycodone (OXY-IR) 5 MG capsule, Take 1 capsule (5 mg total) by mouth every 4 (four) hours as needed (severe pain). (Patient not taking: Reported on 01/23/2023), Disp: 20 capsule, Rfl: 0   oxyCODONE (ROXICODONE) 5 MG immediate release tablet, Take 1 tablet (5 mg total) by mouth every 4 (four) hours as needed for severe pain. (Patient not taking: Reported on 01/23/2023), Disp: 30 tablet, Rfl: 0   Rimegepant Sulfate (NURTEC) 75 MG TBDP, Take 1 tablet (75 mg total) by mouth as needed., Disp: 8 tablet, Rfl: 6   topiramate (TOPAMAX) 50 MG tablet, Take 1 tablet (50 mg total) by mouth at bedtime., Disp: 90 tablet, Rfl: 0   zolmitriptan (ZOMIG) 5 MG tablet, Take 1 tablet (5 mg total) by mouth as needed for migraine., Disp: 10 tablet, Rfl: 6  Current Facility-Administered Medications:    oxyCODONE (Oxy IR/ROXICODONE) immediate release tablet 5 mg, 5 mg, Oral, Q4H PRN, Huel Cote, MD   Medications ordered in this encounter:  Meds ordered this encounter  Medications   loperamide (IMODIUM A-D) 2 MG  tablet    Sig: Take 1 tablet (2 mg total) by mouth 4 (four) times daily as needed for diarrhea or loose stools.    Dispense:  30 tablet    Refill:  0    Supervising Provider:   Merrilee Jansky [[id number]]   ondansetron (ZOFRAN-ODT) 4 MG disintegrating tablet    Sig: Take 1-2 tablets (4-8 mg total) by mouth every 8 (eight) hours as needed.    Dispense:  20 tablet    Refill:  0    Supervising Provider:   Merrilee Jansky [[id number]]     *If you need refills on other medications prior to your next  appointment, please contact your pharmacy*  Follow-Up: Call back or seek an in-person evaluation if the symptoms worsen or if the condition fails to improve as anticipated.  [facility] [phone]  Other Instructions Norovirus Infection Norovirus infection causes inflammation in the stomach and intestines (gastroenteritis) and food poisoning. It is caused by exposure to a virus from a group of similar viruses called noroviruses. Norovirus spreads very easily from person to person (is very contagious). It often occurs in places where people are in close contact, such as schools, nursing homes, restaurants, and cruise ships. You can get it from food, water, surfaces, or other people who have the virus. Norovirus is also found in the stool (feces) or vomit of infected people. You can spread the infection as soon as you feel sick, and you may continue to be contagious after you recover. What are the causes? This condition is caused by contact with norovirus. You can catch norovirus if you: Eat or drink something that is contaminated with norovirus. Touch surfaces or objects that are contaminated with norovirus and then put your hand in or by your mouth or nose. Have direct contact with an infected person who may or may not still have symptoms. Share food, drink, or utensils with someone who is contagious with norovirus. What are the signs or symptoms? Symptoms usually begin within 12 hours to 2 days after you become infected. Most norovirus symptoms affect the digestive system.Symptoms may include: Nausea, vomiting, and diarrhea. Stomach cramps. Fever. Chills. Headache. Muscle aches and tiredness. How is this diagnosed? This condition may be diagnosed based on: Your symptoms. A physical exam. A stool test. How is this treated? There is no specific treatment for norovirus. Most people get better without treatment in about 2 days. Young children, the elderly, and people who  are already sick may take up to 6 days to recover. Follow these instructions at home:  Eating and drinking  Drink plenty of water to replace fluids that are lost through diarrhea and vomiting. This prevents dehydration. Drink enough fluid to keep your urine pale yellow. Drink clear fluids in small amounts as you are able. Clear fluids include water, ice chips, fruit juice with water added (diluted fruit juice), and low-calorie sports drinks. Avoid fluids that contain a lot of sugar or caffeine, such as energy drinks, sports drinks, and soda. Avoid alcohol. If instructed by your health care provider, drink an oral rehydration solution (ORS). This is a drink that is sold at pharmacies and retail stores. An ORS contains minerals (electrolytes) that you can lose through diarrhea and vomiting. Eat bland, easy-to-digest foods in small amounts as you are able. These foods include rice, lean meats, toast, and crackers. Avoid spicy or fatty foods. General instructions Rest at home while you recover. Do not prepare food for others while you  are infected. Wait at least 3 days after you recover from the illness to do this. Take over-the-counter and prescription medicines only as told by your health care provider. Wash your hands frequently with soap and water for at least 20 seconds. Alcohol-based hand sanitizer can be used in addition to soap and water, but sanitizer should not be the only cleansing method because it is not effective at removing norovirus from your hands or surfaces. Make sure that each person in your household washes his or her hands well and often. Keep all follow-up visits. This is important. How is this prevented? To help prevent the spread of norovirus: Stay at home if you are feeling sick. This will reduce the risk of spreading the virus to others. Wash your hands often with soap and water for at least 20 seconds, especially after using the toilet, helping a child use the toilet,  or changing a child's diaper. Wash fruits and vegetables thoroughly before peeling, preparing, or serving them. Throw out any food that a sick person may have touched. Disinfect contaminated surfaces immediately after someone in the household has been sick. Disinfect frequently used surfaces, such as counters, doorknobs, and faucets. Use a bleach-based household cleaner. Immediately remove and wash soiled clothes or sheets. Contact a health care provider if: You have vomiting, diarrhea, or stomach pain that gets worse. You have symptoms that do not go away after 3-6 days. You have a fever. You cannot drink without vomiting. You feel light-headed or dizzy. Your symptoms get worse. Get help right away if: You develop symptoms of dehydration that do not improve with fluid replacement, such as: Excessive sleepiness. Lack of tears. Very little urine production. Dry mouth. Muscle cramps. Weak pulse. Confusion. Summary Norovirus infection is common and often occurs in places where people are in close contact, such as schools, nursing homes, restaurants, and cruise ships. To help prevent the spread of this infection, wash hands with soap and water for at least 20 seconds before handling food or after having contact with stool or body fluids. There is no specific treatment for norovirus, but most people get better without treatment in about 2 days. People who are healthy when infected often recover sooner than those who are elderly, young, or already sick. Replace lost fluids by drinking plenty of water, or by drinking oral rehydration solution (ORS), which contains important minerals called electrolytes. This prevents dehydration. This information is not intended to replace advice given to you by your health care provider. Make sure you discuss any questions you have with your health care provider. Document Revised: 02/01/2021 Document Reviewed: 02/01/2021 Elsevier Patient Education  2024  [employer]   If you have been instructed to have an in-person evaluation today at a local Urgent Care facility, please use the link below. It will take you to a list of all of our available [facility], including address, phone number and hours of operation. Please do not delay care.  [facility]  If you or a family member do not have a primary care provider, use the link below to schedule a visit and establish care. When you choose a [facility] primary care physician or advanced practice provider, you gain a long-term partner in health. Find a Primary Care Provider  Learn more about [facility] in-office and virtual care options: [facility] - Get Care Now

## 2023-07-26 NOTE — Progress Notes (Signed)
Virtual Visit Consent   Sarah Barber, you are scheduled for a virtual visit with a [facility] provider today. Just as with appointments in the office, your consent must be obtained to participate. Your consent will be active for this visit and any virtual visit you may have with one of our providers in the next 365 days. If you have a MyChart account, a copy of this consent can be sent to you electronically.  As this is a virtual visit, video technology does not allow for your provider to perform a traditional examination. This may limit your provider's ability to fully assess your condition. If your provider identifies any concerns that need to be evaluated in person or the need to arrange testing (such as labs, EKG, etc.), we will make arrangements to do so. Although advances in technology are sophisticated, we cannot ensure that it will always work on either your end or our end. If the connection with a video visit is poor, the visit may have to be switched to a telephone visit. With either a video or telephone visit, we are not always able to ensure that we have a secure connection.  By engaging in this virtual visit, you consent to the provision of healthcare and authorize for your insurance to be billed (if applicable) for the services provided during this visit. Depending on your insurance coverage, you may receive a charge related to this service.  I need to obtain your verbal consent now. Are you willing to proceed with your visit today? [name] has provided verbal consent on 07/26/2023 for a virtual visit (video or telephone). Margaretann Loveless, PA-C  Date: 07/26/2023 3:35 PM  Virtual Visit via Video Note   I, Margaretann Loveless, connected with  Sarah Barber  ([id number], [birth year]) on 07/26/23 at  4:00 PM EST by a video-enabled telemedicine application and verified that I am speaking with the correct person using two identifiers.  Location: Patient: Virtual Visit  Location Patient: Home Provider: Virtual Visit Location Provider: Home Office   I discussed the limitations of evaluation and management by telemedicine and the availability of in person appointments. The patient expressed understanding and agreed to proceed.    History of Present Illness: [name] is a 48 y.o. who identifies as a female who was assigned female at birth, and is being seen today for nausea, vomiting, diarrhea.  HPI: Emesis  This is a new problem. The current episode started today (at 3am overnight). The problem occurs 2 to 4 times per day. The problem has been unchanged. The emesis has an appearance of bile and stomach contents. There has been no fever. Associated symptoms include arthralgias, diarrhea, myalgias and sweats. Pertinent negatives include no chills or fever. Risk factors include ill contacts (daughter). She has tried bed rest and increased fluids for the symptoms. The treatment provided no relief.     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   Anemia due to acute blood loss 07/23/2019   Fibroid uterus 04/27/2019   Family history of breast cancer in first degree relative 04/27/2019   Abnormal uterine bleeding 04/27/2019    Allergies:  Allergies  Allergen Reactions   No Known Allergies Other (See Comments)   Other Rash    pineapple,cabbage   Pineapple Rash   Medications:  Current Outpatient Medications:    loperamide (IMODIUM A-D)  2 MG tablet, Take 1 tablet (2 mg total) by mouth 4 (four) times daily as needed for diarrhea or loose stools., Disp: 30 tablet, Rfl: 0   ondansetron (ZOFRAN-ODT) 4 MG disintegrating tablet, Take 1-2 tablets (4-8 mg total) by mouth every 8 (eight) hours as needed., Disp: 20 tablet, Rfl: 0   traMADol (ULTRAM) 50 MG tablet, Take 1 tablet (50 mg total) by mouth every 6 (six) hours as  needed. (Patient not taking: Reported on 01/23/2023), Disp: 30 tablet, Rfl: 0   albuterol (VENTOLIN HFA) 108 (90 Base) MCG/ACT inhaler, Inhale 2 puffs into the lungs every 6 (six) hours as needed for wheezing or shortness of breath., Disp: 8 g, Rfl: 0   aspirin EC 325 MG tablet, Take 1 tablet (325 mg total) by mouth daily., Disp: 30 tablet, Rfl: 0   benzonatate (TESSALON) 100 MG capsule, Take 1 capsule (100 mg total) by mouth 3 (three) times daily as needed for cough. (Patient not taking: Reported on 01/23/2023), Disp: 30 capsule, Rfl: 0   butalbital-acetaminophen-caffeine (FIORICET) 50-325-40 MG tablet, Take by mouth 2 (two) times daily as needed for headache. (Patient not taking: Reported on 01/23/2023), Disp: , Rfl:    COVID-19 At-Home Test KIT, Use to test for COVID, Disp: 1 kit, Rfl: 0   Galcanezumab-gnlm (EMGALITY) 120 MG/ML SOAJ, Inject 2 Pens into the skin every 30 (thirty) days., Disp: 2.24 mL, Rfl: 0   Galcanezumab-gnlm (EMGALITY) 120 MG/ML SOAJ, Inject 1 Pen into the skin every 30 (thirty) days., Disp: 1.12 mL, Rfl: 3   lidocaine (LIDODERM) 5 %, Place 1 patch onto the skin daily. Remove & Discard patch within 12 hours or as directed by MD (Patient not taking: Reported on 01/23/2023), Disp: 30 patch, Rfl: 0   oxycodone (OXY-IR) 5 MG capsule, Take 1 capsule (5 mg total) by mouth every 4 (four) hours as needed (severe pain). (Patient not taking: Reported on 01/23/2023), Disp: 20 capsule, Rfl: 0   oxyCODONE (ROXICODONE) 5 MG immediate release tablet, Take 1 tablet (5 mg total) by mouth every 4 (four) hours as needed for severe pain. (Patient not taking: Reported on 01/23/2023), Disp: 30 tablet, Rfl: 0   Rimegepant Sulfate (NURTEC) 75 MG TBDP, Take 1 tablet (75 mg total) by mouth as needed., Disp: 8 tablet, Rfl: 6   topiramate (TOPAMAX) 50 MG tablet, Take 1 tablet (50 mg total) by mouth at bedtime., Disp: 90 tablet, Rfl: 0   zolmitriptan (ZOMIG) 5 MG tablet, Take 1 tablet (5 mg total) by mouth as needed  for migraine., Disp: 10 tablet, Rfl: 6  Current Facility-Administered Medications:    oxyCODONE (Oxy IR/ROXICODONE) immediate release tablet 5 mg, 5 mg, Oral, Q4H PRN, Huel Cote, MD  Observations/Objective: Patient is well-developed, well-nourished in no acute distress.  Resting comfortably at home.  Head is normocephalic, atraumatic.  No labored breathing.  Speech is clear and coherent with logical content.  Patient is alert and oriented at baseline.    Assessment and Plan: 1. Viral gastroenteritis (Primary) - loperamide (IMODIUM A-D) 2 MG tablet; Take 1 tablet (2 mg total) by mouth 4 (four) times daily as needed for diarrhea or loose stools.  Dispense: 30 tablet; Refill: 0 - ondansetron (ZOFRAN-ODT) 4 MG disintegrating tablet; Take 1-2 tablets (4-8 mg total) by mouth every 8 (eight) hours as needed.  Dispense: 20 tablet; Refill: 0  - Suspect viral gastroenteritis - Zofran for nausea - Imodium for diarrhea - Push fluids, electrolyte beverages - Liquid diet, then increase to soft/bland (BRAT) diet  over next day, then increase diet as tolerated - Seek in person evaluation if not improving or symptoms worsen   Follow Up Instructions: I discussed the assessment and treatment plan with the patient. The patient was provided an opportunity to ask questions and all were answered. The patient agreed with the plan and demonstrated an understanding of the instructions.  A copy of instructions were sent to the patient via MyChart unless otherwise noted below.    The patient was advised to call back or seek an in-person evaluation if the symptoms worsen or if the condition fails to improve as anticipated.    Margaretann Loveless, PA-C

## 2023-08-30 ENCOUNTER — Other Ambulatory Visit (HOSPITAL_COMMUNITY): Payer: Self-pay

## 2023-08-30 ENCOUNTER — Telehealth: Payer: Self-pay

## 2023-08-30 NOTE — Telephone Encounter (Signed)
   It is time to renew PA for Emgality-pt has not been evaluated since starting Emgality-Insurance would like to know the above-Please advise

## 2023-12-19 ENCOUNTER — Encounter

## 2024-01-21 ENCOUNTER — Other Ambulatory Visit: Payer: Self-pay | Admitting: Endocrinology

## 2024-01-21 DIAGNOSIS — Z1231 Encounter for screening mammogram for malignant neoplasm of breast: Secondary | ICD-10-CM

## 2024-01-24 ENCOUNTER — Ambulatory Visit
Admission: RE | Admit: 2024-01-24 | Discharge: 2024-01-24 | Disposition: A | Discharge status: Home / Self Care | Source: Ambulatory Visit

## 2024-01-24 DIAGNOSIS — Z1231 Encounter for screening mammogram for malignant neoplasm of breast: Secondary | ICD-10-CM

## 2024-01-25 ENCOUNTER — Telehealth: Admitting: Family Medicine

## 2024-01-25 DIAGNOSIS — T148XXA Other injury of unspecified body region, initial encounter: Secondary | ICD-10-CM | POA: Diagnosis not present

## 2024-01-25 DIAGNOSIS — M79601 Pain in right arm: Secondary | ICD-10-CM | POA: Diagnosis not present

## 2024-01-25 NOTE — Progress Notes (Signed)
 Virtual Visit Consent   Sarah Barber, you are scheduled for a virtual visit with a [facility] provider today. Just as with appointments in the office, your consent must be obtained to participate. Your consent will be active for this visit and any virtual visit you may have with one of our providers in the next 365 days. If you have a MyChart account, a copy of this consent can be sent to you electronically.  As this is a virtual visit, video technology does not allow for your provider to perform a traditional examination. This may limit your provider's ability to fully assess your condition. If your provider identifies any concerns that need to be evaluated in person or the need to arrange testing (such as labs, EKG, etc.), we will make arrangements to do so. Although advances in technology are sophisticated, we cannot ensure that it will always work on either your end or our end. If the connection with a video visit is poor, the visit may have to be switched to a telephone visit. With either a video or telephone visit, we are not always able to ensure that we have a secure connection.  By engaging in this virtual visit, you consent to the provision of healthcare and authorize for your insurance to be billed (if applicable) for the services provided during this visit. Depending on your insurance coverage, you may receive a charge related to this service.  I need to obtain your verbal consent now. Are you willing to proceed with your visit today? [name] has provided verbal consent on 01/25/2024 for a virtual visit (video or telephone). Sarah Barber, [place]  Date: 01/25/2024 3:14 PM   Virtual Visit via Video Note   I, Sarah Barber, connected with  Georgette Finder  ([id number], Jan 11, 1976) on 01/25/24 at  3:00 PM EDT by a video-enabled telemedicine application and verified that I am speaking with the correct person using two identifiers.  Location: Patient: Virtual Visit Location Patient:  Home Provider: Virtual Visit Location Provider: Home Office   I discussed the limitations of evaluation and management by telemedicine and the availability of in person appointments. The patient expressed understanding and agreed to proceed.    History of Present Illness: Sarah Barber is a 48 y.o. who identifies as a female who was assigned female at birth, and is being seen today for c/o donating plasma a week ago and states her arm is extremely sore from her upper arm to her shoulder.  Pt states she has bruising. Pt states she is able to move her arm. Pt states has not take anything and Pt denies swelling or bleeding from area where plasma was taken from. Pt states she has bruising that is extending to her upper arm.  Pt states she is concerned for a clot moving up her arm. 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  Pt states she has Naproxen  at home but has not tried it.   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 (Patient not taking: Reported on 01/23/2023), Disp: 30 tablet, Rfl: 0   albuterol  (VENTOLIN  HFA) 108 (90 Base) MCG/ACT inhaler, Inhale 2 puffs into the lungs every 6 (six) hours as needed for wheezing or shortness of breath., Disp: 8 g, Rfl: 0   aspirin  EC 325 MG tablet, Take 1 tablet (325 mg total) by mouth daily., Disp: 30 tablet, Rfl: 0    benzonatate  (TESSALON ) 100 MG capsule, Take 1 capsule (100 mg total) by mouth 3 (three) times daily as needed for cough. (Patient not taking: Reported on 01/23/2023), Disp: 30 capsule, Rfl: 0   butalbital-acetaminophen -caffeine (FIORICET) 50-325-40 MG tablet, Take by mouth 2 (two) times daily as needed for headache. (Patient not taking: Reported on 01/23/2023), Disp: , Rfl:    COVID-19 At-Home Test KIT, Use to test for COVID, Disp: 1 kit, Rfl: 0   Galcanezumab -gnlm (EMGALITY ) 120 MG/ML SOAJ, Inject 2 Pens into the skin every 30 (thirty) days., Disp: 2.24 mL, Rfl: 0   Galcanezumab -gnlm (EMGALITY ) 120 MG/ML SOAJ, Inject 1 Pen into the skin every 30 (thirty) days., Disp: 1.12 mL, Rfl: 3   lidocaine  (LIDODERM ) 5 %, Place 1 patch onto the skin daily. Remove & Discard patch within 12 hours or as directed by MD (Patient not taking: Reported on 01/23/2023), Disp: 30 patch, Rfl: 0   loperamide  (IMODIUM  A-D) 2 MG tablet, Take 1 tablet (2 mg total) by mouth 4 (four) times daily as needed for diarrhea or loose stools., Disp: 30 tablet, Rfl: 0   ondansetron  (ZOFRAN -ODT) 4 MG disintegrating tablet, Take 1-2 tablets (4-8 mg total) by mouth every 8 (eight) hours as needed., Disp: 20 tablet, Rfl: 0   oxycodone  (OXY-IR) 5 MG capsule, Take 1 capsule (5 mg total) by mouth every 4 (four) hours as needed (severe pain). (Patient not taking: Reported on 01/23/2023), Disp: 20 capsule, Rfl: 0   oxyCODONE  (ROXICODONE ) 5 MG immediate release tablet, Take 1 tablet (5 mg total) by mouth every 4 (four) hours as needed for severe pain. (Patient not taking: Reported on 01/23/2023), Disp: 30 tablet, Rfl: 0   Rimegepant Sulfate (NURTEC) 75 MG TBDP, Take 1 tablet (75 mg total) by mouth as needed., Disp: 8 tablet, Rfl: 6   topiramate  (TOPAMAX ) 50 MG tablet, Take 1 tablet (50 mg total) by mouth at bedtime., Disp: 90 tablet, Rfl: 0   zolmitriptan  (ZOMIG ) 5 MG tablet, Take 1 tablet (5 mg total) by mouth as needed for migraine., Disp: 10 tablet,  Rfl: 6  Current Facility-Administered Medications:    oxyCODONE  (Oxy IR/ROXICODONE ) immediate release tablet 5 mg, 5 mg, Oral, Q4H PRN, Genelle Standing, MD  Observations/Objective: Patient is well-developed, well-nourished in no acute distress.  Resting comfortably at home.  Head is normocephalic, atraumatic.  No labored breathing.  Speech is clear and coherent with logical content.  Patient is alert and oriented at baseline.    Assessment and Plan: 1. Arm pain, diffuse, right (Primary)  2. Bruising  -Pt advised to take over the counter pain medication for symptoms  -Pt also advised to be seen in person urgent care or emergency room if symptoms are worsening or persistent -Pt verbalized understanding   Follow Up Instructions: I discussed the assessment and treatment plan with the patient. The patient was provided an opportunity to ask questions and all were answered. The patient agreed  with the plan and demonstrated an understanding of the instructions.  A copy of instructions were sent to the patient via MyChart unless otherwise noted below.    The patient was advised to call back or seek an in-person evaluation if the symptoms worsen or if the condition fails to improve as anticipated.    Sarah Mater, PA-C

## 2024-01-25 NOTE — Patient Instructions (Signed)
 Sarah Barber, thank you for joining Roosvelt Mater, PA-C for today's virtual visit.  While this provider is not your primary care provider (PCP), if your PCP is located in our provider database this encounter information will be shared with them immediately following your visit.   A [facility] MyChart account gives you access to today's visit and all your visits, tests, and labs performed at [facility]  click here if you don't have a  MyChart account or go to [email or web address]  Consent: (Patient) Sarah Barber provided verbal consent for this virtual visit at the beginning of the encounter.  Current Medications:  Current Outpatient Medications:    traMADol  (ULTRAM ) 50 MG tablet, Take 1 tablet (50 mg total) by mouth every 6 (six) hours as needed. (Patient not taking: Reported on 01/23/2023), Disp: 30 tablet, Rfl: 0   albuterol  (VENTOLIN  HFA) 108 (90 Base) MCG/ACT inhaler, Inhale 2 puffs into the lungs every 6 (six) hours as needed for wheezing or shortness of breath., Disp: 8 g, Rfl: 0   aspirin  EC 325 MG tablet, Take 1 tablet (325 mg total) by mouth daily., Disp: 30 tablet, Rfl: 0   benzonatate  (TESSALON ) 100 MG capsule, Take 1 capsule (100 mg total) by mouth 3 (three) times daily as needed for cough. (Patient not taking: Reported on 01/23/2023), Disp: 30 capsule, Rfl: 0   butalbital-acetaminophen -caffeine (FIORICET) 50-325-40 MG tablet, Take by mouth 2 (two) times daily as needed for headache. (Patient not taking: Reported on 01/23/2023), Disp: , Rfl:    COVID-19 At-Home Test KIT, Use to test for COVID, Disp: 1 kit, Rfl: 0   Galcanezumab -gnlm (EMGALITY ) 120 MG/ML SOAJ, Inject 2 Pens into the skin every 30 (thirty) days., Disp: 2.24 mL, Rfl: 0   Galcanezumab -gnlm (EMGALITY ) 120 MG/ML SOAJ, Inject 1 Pen into the skin every 30 (thirty) days., Disp: 1.12 mL, Rfl: 3   lidocaine  (LIDODERM ) 5 %, Place 1 patch onto the skin daily. Remove & Discard patch within 12 hours  or as directed by MD (Patient not taking: Reported on 01/23/2023), Disp: 30 patch, Rfl: 0   loperamide  (IMODIUM  A-D) 2 MG tablet, Take 1 tablet (2 mg total) by mouth 4 (four) times daily as needed for diarrhea or loose stools., Disp: 30 tablet, Rfl: 0   ondansetron  (ZOFRAN -ODT) 4 MG disintegrating tablet, Take 1-2 tablets (4-8 mg total) by mouth every 8 (eight) hours as needed., Disp: 20 tablet, Rfl: 0   oxycodone  (OXY-IR) 5 MG capsule, Take 1 capsule (5 mg total) by mouth every 4 (four) hours as needed (severe pain). (Patient not taking: Reported on 01/23/2023), Disp: 20 capsule, Rfl: 0   oxyCODONE  (ROXICODONE ) 5 MG immediate release tablet, Take 1 tablet (5 mg total) by mouth every 4 (four) hours as needed for severe pain. (Patient not taking: Reported on 01/23/2023), Disp: 30 tablet, Rfl: 0   Rimegepant Sulfate (NURTEC) 75 MG TBDP, Take 1 tablet (75 mg total) by mouth as needed., Disp: 8 tablet, Rfl: 6   topiramate  (TOPAMAX ) 50 MG tablet, Take 1 tablet (50 mg total) by mouth at bedtime., Disp: 90 tablet, Rfl: 0   zolmitriptan  (ZOMIG ) 5 MG tablet, Take 1 tablet (5 mg total) by mouth as needed for migraine., Disp: 10 tablet, Rfl: 6  Current Facility-Administered Medications:    oxyCODONE  (Oxy IR/ROXICODONE ) immediate release tablet 5 mg, 5 mg, Oral, Q4H PRN, Bokshan, Steven, MD   Medications ordered in this encounter:  No orders of the defined types were placed in this encounter.    *  If you need refills on other medications prior to your next appointment, please contact your pharmacy*  Follow-Up: Call back or seek an in-person evaluation if the symptoms worsen or if the condition fails to improve as anticipated.  [facility] [phone]  Other Instructions Contusion A contusion is a deep bruise. Contusions are the result of a blunt injury to tissues and muscle fibers under the skin. The injury causes bleeding under the skin. The skin over the contusion may turn blue, purple, or  yellow. Minor injuries will give you a painless contusion, but more severe injuries cause contusions that can stay painful and swollen for a few weeks. Follow these instructions at home: Pay attention to any changes in your symptoms. Let your health care provider know about them. Take these actions to relieve your pain. Managing pain, stiffness, and swelling  Use resting, icing, applying pressure (compression), and raising (elevating) the injured area. This is often called the RICE method. Rest the injured area. Return to your normal activities as told by your health care provider. Ask your health care provider what activities are safe for you. If directed, put ice on the injured area. To do this: Put ice in a plastic bag. Place a towel between your skin and the bag. Leave the ice on for 20 minutes, 2-3 times a day. If your skin turns bright red, remove the ice right away to prevent skin damage. The risk of skin damage is higher if you cannot feel pain, heat, or cold. If directed, apply light compression to the injured area using an elastic bandage. Make sure the bandage is not wrapped too tightly. Remove and reapply the bandage as directed by your health care provider. If possible, elevate the injured area above the level of your heart while you are sitting or lying down. General instructions Take over-the-counter and prescription medicines only as told by your health care provider. Keep all follow-up visits. Your health care provider may want to see how your contusion is healing with treatment. Contact a health care provider if: Your symptoms do not improve after several days of treatment. Your symptoms get worse. You have difficulty moving the injured area. Get help right away if: You have severe pain. You have numbness in a hand or foot. Your hand or foot turns pale or cold. This information is not intended to replace advice given to you by your health care provider. Make sure you  discuss any questions you have with your health care provider. Document Revised: 12/11/2021 Document Reviewed: 12/11/2021 Elsevier Patient Education  2024 [employer]   If you have been instructed to have an in-person evaluation today at a local Urgent Care facility, please use the link below. It will take you to a list of all of our available [facility], including address, phone number and hours of operation. Please do not delay care.  [facility]  If you or a family member do not have a primary care provider, use the link below to schedule a visit and establish care. When you choose a [facility] primary care physician or advanced practice provider, you gain a long-term partner in health. Find a Primary Care Provider  Learn more about [facility] in-office and virtual care options: [facility] - Get Care Now

## 2024-05-28 ENCOUNTER — Telehealth: Admitting: Physician Assistant

## 2024-05-28 DIAGNOSIS — S29019A Strain of muscle and tendon of unspecified wall of thorax, initial encounter: Secondary | ICD-10-CM

## 2024-05-28 MED ORDER — METHOCARBAMOL 500 MG PO TABS: 500.0000 mg | ORAL_TABLET | Freq: Four times a day (QID) | ORAL | 0 refills | Status: AC

## 2024-05-28 MED ORDER — NAPROXEN 500 MG PO TABS: 500.0000 mg | ORAL_TABLET | Freq: Two times a day (BID) | ORAL | 0 refills | Status: AC

## 2024-05-28 NOTE — Patient Instructions (Signed)
 Sarah Barber, thank you for joining [name], PA-C for today's virtual visit.  While this provider is not your primary care provider (PCP), if your PCP is located in our provider database this encounter information will be shared with them immediately following your visit.   A [facility] MyChart account gives you access to today's visit and all your visits, tests, and labs performed at [facility]  click here if you don't have a [name] account or go to [email or web address]  Consent: (Patient) Sarah Barber provided verbal consent for this virtual visit at the beginning of the encounter.  Current Medications:  Current Outpatient Medications:    traMADol  (ULTRAM ) 50 MG tablet, Take 1 tablet (50 mg total) by mouth every 6 (six) hours as needed. (Patient not taking: Reported on 01/23/2023), Disp: 30 tablet, Rfl: 0   albuterol  (VENTOLIN  HFA) 108 (90 Base) MCG/ACT inhaler, Inhale 2 puffs into the lungs every 6 (six) hours as needed for wheezing or shortness of breath., Disp: 8 g, Rfl: 0   aspirin  EC 325 MG tablet, Take 1 tablet (325 mg total) by mouth daily., Disp: 30 tablet, Rfl: 0   benzonatate  (TESSALON ) 100 MG capsule, Take 1 capsule (100 mg total) by mouth 3 (three) times daily as needed for cough. (Patient not taking: Reported on 01/23/2023), Disp: 30 capsule, Rfl: 0   butalbital-acetaminophen -caffeine (FIORICET) 50-325-40 MG tablet, Take by mouth 2 (two) times daily as needed for headache. (Patient not taking: Reported on 01/23/2023), Disp: , Rfl:    COVID-19 At-Home Test KIT, Use to test for COVID, Disp: 1 kit, Rfl: 0   Galcanezumab -gnlm (EMGALITY ) 120 MG/ML SOAJ, Inject 2 Pens into the skin every 30 (thirty) days., Disp: 2.24 mL, Rfl: 0   Galcanezumab -gnlm (EMGALITY ) 120 MG/ML SOAJ, Inject 1 Pen into the skin every 30 (thirty) days., Disp: 1.12 mL, Rfl: 3   lidocaine  (LIDODERM ) 5 %, Place 1 patch onto the skin daily. Remove & Discard patch within 12 hours  or as directed by MD (Patient not taking: Reported on 01/23/2023), Disp: 30 patch, Rfl: 0   loperamide  (IMODIUM  A-D) 2 MG tablet, Take 1 tablet (2 mg total) by mouth 4 (four) times daily as needed for diarrhea or loose stools., Disp: 30 tablet, Rfl: 0   ondansetron  (ZOFRAN -ODT) 4 MG disintegrating tablet, Take 1-2 tablets (4-8 mg total) by mouth every 8 (eight) hours as needed., Disp: 20 tablet, Rfl: 0   oxycodone  (OXY-IR) 5 MG capsule, Take 1 capsule (5 mg total) by mouth every 4 (four) hours as needed (severe pain). (Patient not taking: Reported on 01/23/2023), Disp: 20 capsule, Rfl: 0   oxyCODONE  (ROXICODONE ) 5 MG immediate release tablet, Take 1 tablet (5 mg total) by mouth every 4 (four) hours as needed for severe pain. (Patient not taking: Reported on 01/23/2023), Disp: 30 tablet, Rfl: 0   Rimegepant Sulfate (NURTEC) 75 MG TBDP, Take 1 tablet (75 mg total) by mouth as needed., Disp: 8 tablet, Rfl: 6   topiramate  (TOPAMAX ) 50 MG tablet, Take 1 tablet (50 mg total) by mouth at bedtime., Disp: 90 tablet, Rfl: 0   zolmitriptan  (ZOMIG ) 5 MG tablet, Take 1 tablet (5 mg total) by mouth as needed for migraine., Disp: 10 tablet, Rfl: 6  Current Facility-Administered Medications:    oxyCODONE  (Oxy IR/ROXICODONE ) immediate release tablet 5 mg, 5 mg, Oral, Q4H PRN, Bokshan, Steven, MD   Medications ordered in this encounter:  No orders of the defined types were placed in this encounter.    *  If you need refills on other medications prior to your next appointment, please contact your pharmacy*  Follow-Up: Call back or seek an in-person evaluation if the symptoms worsen or if the condition fails to improve as anticipated.  [facility] [phone]  Other Instructions `Follow up with primary provider in 24-48 hours. Report to nearest ER with any worsening symptoms.    If you have been instructed to have an in-person evaluation today at a local Urgent Care facility, please use the link  below. It will take you to a list of all of our available [facility], including address, phone number and hours of operation. Please do not delay care.  [facility]  If you or a family member do not have a primary care provider, use the link below to schedule a visit and establish care. When you choose a [facility] primary care physician or advanced practice provider, you gain a long-term partner in health. Find a Primary Care Provider  Learn more about [facility] in-office and virtual care options:  - Get Care Now

## 2024-05-28 NOTE — Progress Notes (Signed)
 Virtual Visit Consent   Sarah Barber, you are scheduled for a virtual visit with a [facility] provider today. Just as with appointments in the office, your consent must be obtained to participate. Your consent will be active for this visit and any virtual visit you may have with one of our providers in the next 365 days. If you have a MyChart account, a copy of this consent can be sent to you electronically.  As this is a virtual visit, video technology does not allow for your provider to perform a traditional examination. This may limit your provider's ability to fully assess your condition. If your provider identifies any concerns that need to be evaluated in person or the need to arrange testing (such as labs, EKG, etc.), we will make arrangements to do so. Although advances in technology are sophisticated, we cannot ensure that it will always work on either your end or our end. If the connection with a video visit is poor, the visit may have to be switched to a telephone visit. With either a video or telephone visit, we are not always able to ensure that we have a secure connection.  By engaging in this virtual visit, you consent to the provision of healthcare and authorize for your insurance to be billed (if applicable) for the services provided during this visit. Depending on your insurance coverage, you may receive a charge related to this service.  I need to obtain your verbal consent now. Are you willing to proceed with your visit today? [name] has provided verbal consent on 05/28/2024 for a virtual visit (video or telephone). Sarah Barber, [place]  Date: 05/28/2024 8:46 AM   Virtual Visit via Video Note   I, Sarah Barber, connected with  Mela Perham  ([id number], [birth year]) on 05/28/24 at  8:45 AM EST by a video-enabled telemedicine application and verified that I am speaking with the correct person using two identifiers.  Location: Patient: Virtual Visit Location  Patient: Home Provider: Virtual Visit Location Provider: Home Office   I discussed the limitations of evaluation and management by telemedicine and the availability of in person appointments. The patient expressed understanding and agreed to proceed.    History of Present Illness: Sarah Barber is a 48 y.o. who identifies as a female who was assigned female at birth, and is being seen today for back pain.  HPI: Back Pain This is a new problem. The current episode started yesterday. The problem occurs constantly. The pain is present in the thoracic spine. The quality of the pain is described as stabbing, shooting and aching. The pain is at a severity of 8/10. The pain is moderate. The symptoms are aggravated by bending. Pertinent negatives include no abdominal pain, headaches, numbness, paresis, paresthesias, tingling or weakness.    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   Anemia due to acute blood loss 07/23/2019   Fibroid uterus 04/27/2019   Family history of breast cancer in first degree relative 04/27/2019   Abnormal uterine bleeding 04/27/2019    Allergies:  Allergies  Allergen Reactions   No Known Allergies Other (See Comments)   Other Rash    pineapple,cabbage   Pineapple Rash   Medications:  Current Outpatient Medications:    traMADol  (ULTRAM ) 50 MG tablet, Take 1 tablet (50 mg total) by mouth every 6 (six) hours as needed. (Patient  not taking: Reported on 01/23/2023), Disp: 30 tablet, Rfl: 0   albuterol  (VENTOLIN  HFA) 108 (90 Base) MCG/ACT inhaler, Inhale 2 puffs into the lungs every 6 (six) hours as needed for wheezing or shortness of breath., Disp: 8 g, Rfl: 0   aspirin  EC 325 MG tablet, Take 1 tablet (325 mg total) by mouth daily., Disp: 30 tablet, Rfl: 0   benzonatate  (TESSALON ) 100 MG capsule, Take 1 capsule (100 mg total) by  mouth 3 (three) times daily as needed for cough. (Patient not taking: Reported on 01/23/2023), Disp: 30 capsule, Rfl: 0   butalbital-acetaminophen -caffeine (FIORICET) 50-325-40 MG tablet, Take by mouth 2 (two) times daily as needed for headache. (Patient not taking: Reported on 01/23/2023), Disp: , Rfl:    COVID-19 At-Home Test KIT, Use to test for COVID, Disp: 1 kit, Rfl: 0   Galcanezumab -gnlm (EMGALITY ) 120 MG/ML SOAJ, Inject 2 Pens into the skin every 30 (thirty) days., Disp: 2.24 mL, Rfl: 0   Galcanezumab -gnlm (EMGALITY ) 120 MG/ML SOAJ, Inject 1 Pen into the skin every 30 (thirty) days., Disp: 1.12 mL, Rfl: 3   lidocaine  (LIDODERM ) 5 %, Place 1 patch onto the skin daily. Remove & Discard patch within 12 hours or as directed by MD (Patient not taking: Reported on 01/23/2023), Disp: 30 patch, Rfl: 0   loperamide  (IMODIUM  A-D) 2 MG tablet, Take 1 tablet (2 mg total) by mouth 4 (four) times daily as needed for diarrhea or loose stools., Disp: 30 tablet, Rfl: 0   ondansetron  (ZOFRAN -ODT) 4 MG disintegrating tablet, Take 1-2 tablets (4-8 mg total) by mouth every 8 (eight) hours as needed., Disp: 20 tablet, Rfl: 0   oxycodone  (OXY-IR) 5 MG capsule, Take 1 capsule (5 mg total) by mouth every 4 (four) hours as needed (severe pain). (Patient not taking: Reported on 01/23/2023), Disp: 20 capsule, Rfl: 0   oxyCODONE  (ROXICODONE ) 5 MG immediate release tablet, Take 1 tablet (5 mg total) by mouth every 4 (four) hours as needed for severe pain. (Patient not taking: Reported on 01/23/2023), Disp: 30 tablet, Rfl: 0   Rimegepant Sulfate (NURTEC) 75 MG TBDP, Take 1 tablet (75 mg total) by mouth as needed., Disp: 8 tablet, Rfl: 6   topiramate  (TOPAMAX ) 50 MG tablet, Take 1 tablet (50 mg total) by mouth at bedtime., Disp: 90 tablet, Rfl: 0   zolmitriptan  (ZOMIG ) 5 MG tablet, Take 1 tablet (5 mg total) by mouth as needed for migraine., Disp: 10 tablet, Rfl: 6  Current Facility-Administered Medications:    oxyCODONE  (Oxy  IR/ROXICODONE ) immediate release tablet 5 mg, 5 mg, Oral, Q4H PRN, Genelle Standing, MD  Observations/Objective: Patient is well-developed, well-nourished in no acute distress.  Resting comfortably  at home.  Head is normocephalic, atraumatic.  No labored breathing.  Speech is clear and coherent with logical content.  Patient is alert and oriented at baseline. Pain with ROM no deformity noted.   Assessment and Plan: 1. Thoracic myofascial strain, initial encounter (Primary)  Patient presenting with acute onset of upper right back pain. No evidence for cauda equina, spinal infection, bony injury, other significant pathology.  The patient did not have any urinary incontinence, bowel incontinence, saddle anesthesia, fever, or weight loss that would advanced warrant imaging. Patient was advised to follow up with primary provider 3-5 days if continuing to have  pain. Advised to present to ER if signs of cauda equina or spinal infection develop including loss of bowel or bladder function, peripheral numbness/weakness/tingling, significant fevers, or other concerns.  Advised Naprosyn  for pain and inflammation. Advised patient on supportive therapies, including rest, relaxation techniques, heat application, weight loss, ergonomic therapies, and refraining from lifting heavy objects. Instructed patient on low back pain ROM exercises. Warning symptoms discussed which would prompt return  Follow Up Instructions: I discussed the assessment and treatment plan with the patient. The patient was provided an opportunity to ask questions and all were answered. The patient agreed with the plan and demonstrated an understanding of the instructions.  A copy of instructions were sent to the patient via MyChart unless otherwise noted below.    The patient was advised to call back or seek an in-person evaluation if the symptoms worsen or if the condition fails to improve as anticipated.    Sarah Shuck,  PA-C

## 2024-07-29 ENCOUNTER — Ambulatory Visit (INDEPENDENT_AMBULATORY_CARE_PROVIDER_SITE_OTHER): Admitting: Orthopaedic Surgery

## 2024-07-29 ENCOUNTER — Ambulatory Visit (HOSPITAL_BASED_OUTPATIENT_CLINIC_OR_DEPARTMENT_OTHER)

## 2024-07-29 DIAGNOSIS — M79671 Pain in right foot: Secondary | ICD-10-CM

## 2024-07-29 DIAGNOSIS — M25562 Pain in left knee: Secondary | ICD-10-CM

## 2024-07-29 DIAGNOSIS — M79672 Pain in left foot: Secondary | ICD-10-CM

## 2024-07-29 DIAGNOSIS — M25561 Pain in right knee: Secondary | ICD-10-CM

## 2024-07-29 DIAGNOSIS — G8929 Other chronic pain: Secondary | ICD-10-CM | POA: Diagnosis not present

## 2024-07-29 NOTE — Progress Notes (Signed)
 "                                Post Operative Evaluation    Procedure/Date of Surgery: Right knee MPFL reconstruction 7/18, bilateral knee pain  Interval History:   For follow-up today of both knees and both feet.  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  She does have special work boots as well.  She has been experiencing pain in the arch of both feet as well as swelling and pain predominantly in the left knee.  The right knee does swell as well although does feel stable.  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   Vision changes    Past Surgical History:  Procedure Laterality Date   CESAREAN SECTION      x 2   HYSTERECTOMY ABDOMINAL WITH SALPINGECTOMY Bilateral 07/28/2019   Procedure: HYSTERECTOMY ABDOMINAL WITH BILATERAL SALPINGECTOMY AND RIGHT OOPHORECTOMY;  Surgeon: Fredirick Glenys RAMAN, MD;  Location: [facility] OR;  Service: Gynecology;  Laterality: Bilateral;   KNEE ARTHROSCOPY Right 01/23/2022   Procedure: RIGHT KNEE ARTHROSCOPY WITH DEBRIDEMENT;  Surgeon: Genelle Standing, MD;  Location: [facility];  Service: Orthopedics;  Laterality: Right;   KNEE RECONSTRUCTION Right 01/23/2022   Procedure: RIGHT KNEE PATELLOFEMORAL LIGAMENT RECONSTRUCTION;  Surgeon: Genelle Standing, MD;  Location: [facility];  Service: Orthopedics;  Laterality: Right;   Social History   Socioeconomic History   Marital status: Single    Spouse name: Not on file   Number of children: Not on file   Years of education: Not  on file   Highest education level: Not on file  Occupational History   Not on file  Tobacco Use   Smoking status: Former    Current packs/day: 0.00    Types: Cigarettes    Quit date: 09/2015    Years since quitting: 8.8   Smokeless tobacco: Never   Tobacco comments:    smoked off and on  Vaping Use   Vaping status: Never Used  Substance and Sexual Activity   Alcohol use: Yes    Comment: occasional   Drug use: Never   Sexual activity: Not on file  Other Topics Concern   Not on file  Social History Narrative   Right handed    Caffeine- rare    Wears glasses    Social Drivers of Health   Tobacco Use: Low Risk (07/09/2024)   Received from [facility]   Patient History    Smoking Tobacco Use: Never    Smokeless Tobacco Use: Never    Passive Exposure: Not on file  Financial Resource Strain: Low Risk (07/08/2024)   Received from [facility]   Overall Financial Resource Strain (CARDIA)    How hard is it for you to pay for the very basics like food, housing, medical care, and heating?: Not hard at  all  Food Insecurity: No Food Insecurity (07/08/2024)   Received from [facility]   Epic    Within the past 12 months, you worried that your food would run out before you got the money to buy more.: Never true    Within the past 12 months, the food you bought just didn't last and you didn't have money to get more.: Never true  Transportation Needs: No Transportation Needs (07/08/2024)   Received from [facility]    In the past 12 months, has lack of transportation kept you from medical appointments or from getting medications?: No    In the past 12 months, has lack of transportation kept you from meetings, work, or from getting things needed for daily living?: No  Physical Activity: Not on file  Stress: Not on file (05/17/2023)  Social Connections: Not on file  Depression (EYV7-0): Not on file  Alcohol Screen: Not on file  Housing: Low Risk (07/08/2024)   Received  from [facility]    In the last 12 months, was there a time when you were not able to pay the mortgage or rent on time?: No    In the past 12 months, how many times have you moved where you were living?: 0    At any time in the past 12 months, were you homeless or living in a shelter (including now)?: No  Utilities: Not At Risk (07/08/2024)   Received from [facility]    In the past 12 months has the electric, gas, oil, or water company threatened to shut off services in your home?: No  Health Literacy: Not on file   Family History  Problem Relation Age of Onset   Breast cancer Mother 34   Diabetes Mother    Cerebrovascular Accident Father        x2   Breast cancer Maternal Grandmother 77 - 69   Diabetes Maternal Grandmother    Kidney disease Maternal Grandmother    Lung cancer Maternal Grandfather    Allergies  Allergen Reactions   No Known Allergies Other (See Comments)   Other Rash    pineapple,cabbage   Pineapple Rash   Current Outpatient Medications  Medication Sig Dispense Refill   traMADol  (ULTRAM ) 50 MG tablet Take 1 tablet (50 mg total) by mouth every 6 (six) hours as needed. (Patient not taking: Reported on 01/23/2023) 30 tablet 0   albuterol  (VENTOLIN  HFA) 108 (90 Base) MCG/ACT inhaler Inhale 2 puffs into the lungs every 6 (six) hours as needed for wheezing or shortness of breath. 8 g 0   aspirin  EC 325 MG tablet Take 1 tablet (325 mg total) by mouth daily. 30 tablet 0   benzonatate  (TESSALON ) 100 MG capsule Take 1 capsule (100 mg total) by mouth 3 (three) times daily as needed for cough. (Patient not taking: Reported on 01/23/2023) 30 capsule 0   butalbital-acetaminophen -caffeine (FIORICET) 50-325-40 MG tablet Take by mouth 2 (two) times daily as needed for headache. (Patient not taking: Reported on 01/23/2023)     COVID-19 At-Home Test KIT Use to test for COVID 1 kit 0   Galcanezumab -gnlm (EMGALITY ) 120 MG/ML SOAJ Inject 2 Pens into the skin every 30  (thirty) days. 2.24 mL 0   Galcanezumab -gnlm (EMGALITY ) 120 MG/ML SOAJ Inject 1 Pen into the skin every 30 (thirty) days. 1.12 mL 3   lidocaine  (LIDODERM ) 5 % Place 1 patch onto the skin daily. Remove & Discard patch within  12 hours or as directed by MD (Patient not taking: Reported on 01/23/2023) 30 patch 0   loperamide  (IMODIUM  A-D) 2 MG tablet Take 1 tablet (2 mg total) by mouth 4 (four) times daily as needed for diarrhea or loose stools. 30 tablet 0   ondansetron  (ZOFRAN -ODT) 4 MG disintegrating tablet Take 1-2 tablets (4-8 mg total) by mouth every 8 (eight) hours as needed. 20 tablet 0   oxycodone  (OXY-IR) 5 MG capsule Take 1 capsule (5 mg total) by mouth every 4 (four) hours as needed (severe pain). (Patient not taking: Reported on 01/23/2023) 20 capsule 0   oxyCODONE  (ROXICODONE ) 5 MG immediate release tablet Take 1 tablet (5 mg total) by mouth every 4 (four) hours as needed for severe pain. (Patient not taking: Reported on 01/23/2023) 30 tablet 0   Rimegepant Sulfate (NURTEC) 75 MG TBDP Take 1 tablet (75 mg total) by mouth as needed. 8 tablet 6   topiramate  (TOPAMAX ) 50 MG tablet Take 1 tablet (50 mg total) by mouth at bedtime. 90 tablet 0   zolmitriptan  (ZOMIG ) 5 MG tablet Take 1 tablet (5 mg total) by mouth as needed for migraine. 10 tablet 6   Current Facility-Administered Medications  Medication Dose Route Frequency Provider Last Rate Last Admin   oxyCODONE  (Oxy IR/ROXICODONE ) immediate release tablet 5 mg  5 mg Oral Q4H PRN Genelle Standing, MD       No results found.  Review of Systems:   A ROS was performed including pertinent positives and negatives as documented in the HPI.   Musculoskeletal Exam:     Right knee incisions are healed no erythema or drainage.  No effusion about the knee.  2 quadrants of lateral medial motion of the patella.  Range of motion is from 0 degrees to approximately 120 degrees.  Quad atrophy with decreased tone.  Negative apprehension with lateral  patellar force. Distal neurosensory exam is intact  Left knee with tenderness about the patellofemoral joint with positive effusion 4 quadrants of lateral motion with positive apprehension range of motion is from -3 to 130 degrees  Imaging:    4 views right knee, 4 views left knee, 3 views right foot, 3 views left foot: Normal  I personally reviewed and interpreted the radiographs.   Assessment:   49 year old female who is status post right MPFL reconstruction with stable right knee.  Her left knee does show evidence of ongoing subluxation and instability similar to the contralateral side with significant laxity on patella testing today.  Given this we will plan to obtain an MRI of the left knee.  She does have persistent swelling of both knees and for this I like to refer to Dr. Burnetta as she does have a history of rheumatoid arthritis and I do believe that there may be a component of this going on beyond the patellar instability.  Regard to the feet I do believe this is predominantly driven by her new workplace changes.  Given this we will plan for referral to [facility] for custom orthotics. Plan :    -Return to clinic as needed      I personally saw and evaluated the patient, and participated in the management and treatment plan.  Standing Genelle, MD Attending Physician, Orthopedic Surgery  This document was dictated using Dragon voice recognition software. A reasonable attempt at proof reading has been made to minimize errors.  Short little buried steps "

## 2024-08-07 ENCOUNTER — Ambulatory Visit (INDEPENDENT_AMBULATORY_CARE_PROVIDER_SITE_OTHER)

## 2024-08-07 ENCOUNTER — Encounter: Payer: Self-pay | Admitting: Podiatry

## 2024-08-07 ENCOUNTER — Ambulatory Visit: Admitting: Podiatry

## 2024-08-07 DIAGNOSIS — M7752 Other enthesopathy of left foot: Secondary | ICD-10-CM

## 2024-08-07 DIAGNOSIS — M722 Plantar fascial fibromatosis: Secondary | ICD-10-CM

## 2024-08-07 DIAGNOSIS — M7751 Other enthesopathy of right foot: Secondary | ICD-10-CM

## 2024-08-07 DIAGNOSIS — M216X2 Other acquired deformities of left foot: Secondary | ICD-10-CM

## 2024-08-07 DIAGNOSIS — M7661 Achilles tendinitis, right leg: Secondary | ICD-10-CM

## 2024-08-07 DIAGNOSIS — M216X1 Other acquired deformities of right foot: Secondary | ICD-10-CM

## 2024-08-07 MED ORDER — BETAMETHASONE SOD PHOS & ACET 6 (3-3) MG/ML IJ SUSP: 6.0000 mg | Freq: Once | INTRAMUSCULAR | Status: DC

## 2024-08-07 MED ORDER — MELOXICAM 15 MG PO TABS: 15.0000 mg | ORAL_TABLET | Freq: Every day | ORAL | 0 refills | Status: AC

## 2024-08-07 MED ORDER — BETAMETHASONE SOD PHOS & ACET 6 (3-3) MG/ML IJ SUSP
6.0000 mg | Freq: Once | INTRAMUSCULAR | Status: AC
  Administered 2024-08-07: 6 mg

## 2024-08-07 NOTE — Patient Instructions (Signed)
 Plantar Fasciitis (Heel Spur Syndrome) with Rehab The plantar fascia is a fibrous, ligament-like, soft-tissue structure that spans the bottom of the foot. Plantar fasciitis is a condition that causes pain in the foot due to inflammation of the tissue. SYMPTOMS   Pain and tenderness on the underneath side of the foot.  Pain that worsens with standing or walking. CAUSES  Plantar fasciitis is caused by irritation and injury to the plantar fascia on the underneath side of the foot. Common mechanisms of injury include:  Direct trauma to bottom of the foot.  Damage to a small nerve that runs under the foot where the main fascia attaches to the heel bone.  Stress placed on the plantar fascia due to bone spurs. RISK INCREASES WITH:   Activities that place stress on the plantar fascia (running, jumping, pivoting, or cutting).  Poor strength and flexibility.  Improperly fitted shoes.  Tight calf muscles.  Flat feet.  Failure to warm-up properly before activity.  Obesity. PREVENTION  Warm up and stretch properly before activity.  Allow for adequate recovery between workouts.  Maintain physical fitness:  Strength, flexibility, and endurance.  Cardiovascular fitness.  Maintain a health body weight.  Avoid stress on the plantar fascia.  Wear properly fitted shoes, including arch supports for individuals who have flat feet.  PROGNOSIS  If treated properly, then the symptoms of plantar fasciitis usually resolve without surgery. However, occasionally surgery is necessary.  RELATED COMPLICATIONS   Recurrent symptoms that may result in a chronic condition.  Problems of the lower back that are caused by compensating for the injury, such as limping.  Pain or weakness of the foot during push-off following surgery.  Chronic inflammation, scarring, and partial or complete fascia tear, occurring more often from repeated injections.  TREATMENT  Treatment initially involves the  use of ice and medication to help reduce pain and inflammation. The use of strengthening and stretching exercises may help reduce pain with activity, especially stretches of the Achilles tendon. These exercises may be performed at home or with a therapist. Your caregiver may recommend that you use heel cups of arch supports to help reduce stress on the plantar fascia. Occasionally, corticosteroid injections are given to reduce inflammation. If symptoms persist for greater than 6 months despite non-surgical (conservative), then surgery may be recommended.   MEDICATION   If pain medication is necessary, then nonsteroidal anti-inflammatory medications, such as aspirin and ibuprofen, or other minor pain relievers, such as acetaminophen, are often recommended.  Do not take pain medication within 7 days before surgery.  Prescription pain relievers may be given if deemed necessary by your caregiver. Use only as directed and only as much as you need.  Corticosteroid injections may be given by your caregiver. These injections should be reserved for the most serious cases, because they may only be given a certain number of times.  HEAT AND COLD  Cold treatment (icing) relieves pain and reduces inflammation. Cold treatment should be applied for 10 to 15 minutes every 2 to 3 hours for inflammation and pain and immediately after any activity that aggravates your symptoms. Use ice packs or massage the area with a piece of ice (ice massage).  Heat treatment may be used prior to performing the stretching and strengthening activities prescribed by your caregiver, physical therapist, or athletic trainer. Use a heat pack or soak the injury in warm water.  SEEK IMMEDIATE MEDICAL CARE IF:  Treatment seems to offer no benefit, or the condition worsens.  Any medications  produce adverse side effects.  EXERCISES- RANGE OF MOTION (ROM) AND STRETCHING EXERCISES - Plantar Fasciitis (Heel Spur Syndrome) These exercises  may help you when beginning to rehabilitate your injury. Your symptoms may resolve with or without further involvement from your physician, physical therapist or athletic trainer. While completing these exercises, remember:   Restoring tissue flexibility helps normal motion to return to the joints. This allows healthier, less painful movement and activity.  An effective stretch should be held for at least 30 seconds.  A stretch should never be painful. You should only feel a gentle lengthening or release in the stretched tissue.  RANGE OF MOTION - Toe Extension, Flexion  Sit with your right / left leg crossed over your opposite knee.  Grasp your toes and gently pull them back toward the top of your foot. You should feel a stretch on the bottom of your toes and/or foot.  Hold this stretch for 10 seconds.  Now, gently pull your toes toward the bottom of your foot. You should feel a stretch on the top of your toes and or foot.  Hold this stretch for 10 seconds. Repeat  times. Complete this stretch 3 times per day.   RANGE OF MOTION - Ankle Dorsiflexion, Active Assisted  Remove shoes and sit on a chair that is preferably not on a carpeted surface.  Place right / left foot under knee. Extend your opposite leg for support.  Keeping your heel down, slide your right / left foot back toward the chair until you feel a stretch at your ankle or calf. If you do not feel a stretch, slide your bottom forward to the edge of the chair, while still keeping your heel down.  Hold this stretch for 10 seconds. Repeat 3 times. Complete this stretch 2 times per day.   STRETCH  Gastroc, Standing  Place hands on wall.  Extend right / left leg, keeping the front knee somewhat bent.  Slightly point your toes inward on your back foot.  Keeping your right / left heel on the floor and your knee straight, shift your weight toward the wall, not allowing your back to arch.  You should feel a gentle stretch  in the right / left calf. Hold this position for 10 seconds. Repeat 3 times. Complete this stretch 2 times per day.  STRETCH  Soleus, Standing  Place hands on wall.  Extend right / left leg, keeping the other knee somewhat bent.  Slightly point your toes inward on your back foot.  Keep your right / left heel on the floor, bend your back knee, and slightly shift your weight over the back leg so that you feel a gentle stretch deep in your back calf.  Hold this position for 10 seconds. Repeat 3 times. Complete this stretch 2 times per day.  STRETCH  Gastrocsoleus, Standing  Note: This exercise can place a lot of stress on your foot and ankle. Please complete this exercise only if specifically instructed by your caregiver.   Place the ball of your right / left foot on a step, keeping your other foot firmly on the same step.  Hold on to the wall or a rail for balance.  Slowly lift your other foot, allowing your body weight to press your heel down over the edge of the step.  You should feel a stretch in your right / left calf.  Hold this position for 10 seconds.  Repeat this exercise with a slight bend in your right /  left knee. Repeat 3 times. Complete this stretch 2 times per day.   STRENGTHENING EXERCISES - Plantar Fasciitis (Heel Spur Syndrome)  These exercises may help you when beginning to rehabilitate your injury. They may resolve your symptoms with or without further involvement from your physician, physical therapist or athletic trainer. While completing these exercises, remember:   Muscles can gain both the endurance and the strength needed for everyday activities through controlled exercises.  Complete these exercises as instructed by your physician, physical therapist or athletic trainer. Progress the resistance and repetitions only as guided.  STRENGTH - Towel Curls  Sit in a chair positioned on a non-carpeted surface.  Place your foot on a towel, keeping your heel  on the floor.  Pull the towel toward your heel by only curling your toes. Keep your heel on the floor. Repeat 3 times. Complete this exercise 2 times per day.  STRENGTH - Ankle Inversion  Secure one end of a rubber exercise band/tubing to a fixed object (table, pole). Loop the other end around your foot just before your toes.  Place your fists between your knees. This will focus your strengthening at your ankle.  Slowly, pull your big toe up and in, making sure the band/tubing is positioned to resist the entire motion.  Hold this position for 10 seconds.  Have your muscles resist the band/tubing as it slowly pulls your foot back to the starting position. Repeat 3 times. Complete this exercises 2 times per day.  Document Released: 06/25/2005 Document Revised: 09/17/2011 Document Reviewed: 10/07/2008 [facility] Patient Information 2014 [place], [place]. Achilles Tendinitis  with Rehab Achilles tendinitis is a disorder of the Achilles tendon. The Achilles tendon connects the large calf muscles (Gastrocnemius and Soleus) to the heel bone (calcaneus). This tendon is sometimes called the heel cord. It is important for pushing-off and standing on your toes and is important for walking, running, or jumping. Tendinitis is often caused by overuse and repetitive microtrauma. SYMPTOMS  Pain, tenderness, swelling, warmth, and redness may occur over the Achilles tendon even at rest.  Pain with pushing off, or flexing or extending the ankle.  Pain that is worsened after or during activity. CAUSES   Overuse sometimes seen with rapid increase in exercise programs or in sports requiring running and jumping.  Poor physical conditioning (strength and flexibility or endurance).  Running sports, especially training running down hills.  Inadequate warm-up before practice or play or failure to stretch before participation.  Injury to the tendon. PREVENTION   Warm up and stretch before practice or  competition.  Allow time for adequate rest and recovery between practices and competition.  Keep up conditioning.  Keep up ankle and leg flexibility.  Improve or keep muscle strength and endurance.  Improve cardiovascular fitness.  Use proper technique.  Use proper equipment (shoes, skates).  To help prevent recurrence, taping, protective strapping, or an adhesive bandage may be recommended for several weeks after healing is complete. PROGNOSIS   Recovery may take weeks to several months to heal.  Longer recovery is expected if symptoms have been prolonged.  Recovery is usually quicker if the inflammation is due to a direct blow as compared with overuse or sudden strain. RELATED COMPLICATIONS   Healing time will be prolonged if the condition is not correctly treated. The injury must be given plenty of time to heal.  Symptoms can reoccur if activity is resumed too soon.  Untreated, tendinitis may increase the risk of tendon rupture requiring additional time for recovery  and possibly surgery. TREATMENT   The first treatment consists of rest anti-inflammatory medication, and ice to relieve the pain.  Stretching and strengthening exercises after resolution of pain will likely help reduce the risk of recurrence. Referral to a physical therapist or athletic trainer for further evaluation and treatment may be helpful.  A walking boot or cast may be recommended to rest the Achilles tendon. This can help break the cycle of inflammation and microtrauma.  Arch supports (orthotics) may be prescribed or recommended by your caregiver as an adjunct to therapy and rest.  Surgery to remove the inflamed tendon lining or degenerated tendon tissue is rarely necessary and has shown less than predictable results. MEDICATION   Nonsteroidal anti-inflammatory medications, such as aspirin and ibuprofen, may be used for pain and inflammation relief. Do not take within 7 days before surgery. Take  these as directed by your caregiver. Contact your caregiver immediately if any bleeding, stomach upset, or signs of allergic reaction occur. Other minor pain relievers, such as acetaminophen, may also be used.  Pain relievers may be prescribed as necessary by your caregiver. Do not take prescription pain medication for longer than 4 to 7 days. Use only as directed and only as much as you need.  Cortisone injections are rarely indicated. Cortisone injections may weaken tendons and predispose to rupture. It is better to give the condition more time to heal than to use them. HEAT AND COLD  Cold is used to relieve pain and reduce inflammation for acute and chronic Achilles tendinitis. Cold should be applied for 10 to 15 minutes every 2 to 3 hours for inflammation and pain and immediately after any activity that aggravates your symptoms. Use ice packs or an ice massage.  Heat may be used before performing stretching and strengthening activities prescribed by your caregiver. Use a heat pack or a warm soak. SEEK MEDICAL CARE IF:  Symptoms get worse or do not improve in 2 weeks despite treatment.  New, unexplained symptoms develop. Drugs used in treatment may produce side effects.  EXERCISES:  RANGE OF MOTION (ROM) AND STRETCHING EXERCISES - Achilles Tendinitis  These exercises may help you when beginning to rehabilitate your injury. Your symptoms may resolve with or without further involvement from your physician, physical therapist or athletic trainer. While completing these exercises, remember:   Restoring tissue flexibility helps normal motion to return to the joints. This allows healthier, less painful movement and activity.  An effective stretch should be held for at least 30 seconds.  A stretch should never be painful. You should only feel a gentle lengthening or release in the stretched tissue.  STRETCH  Gastroc, Standing   Place hands on wall.  Extend right / left leg, keeping the  front knee somewhat bent.  Slightly point your toes inward on your back foot.  Keeping your right / left heel on the floor and your knee straight, shift your weight toward the wall, not allowing your back to arch.  You should feel a gentle stretch in the right / left calf. Hold this position for 10 seconds. Repeat 3 times. Complete this stretch 2 times per day.  STRETCH  Soleus, Standing   Place hands on wall.  Extend right / left leg, keeping the other knee somewhat bent.  Slightly point your toes inward on your back foot.  Keep your right / left heel on the floor, bend your back knee, and slightly shift your weight over the back leg so that you feel a  gentle stretch deep in your back calf.  Hold this position for 10 seconds. Repeat 3 times. Complete this stretch 2 times per day.  STRETCH  Gastrocsoleus, Standing  Note: This exercise can place a lot of stress on your foot and ankle. Please complete this exercise only if specifically instructed by your caregiver.   Place the ball of your right / left foot on a step, keeping your other foot firmly on the same step.  Hold on to the wall or a rail for balance.  Slowly lift your other foot, allowing your body weight to press your heel down over the edge of the step.  You should feel a stretch in your right / left calf.  Hold this position for 10 seconds.  Repeat this exercise with a slight bend in your knee. Repeat 3 times. Complete this stretch 2 times per day.   STRENGTHENING EXERCISES - Achilles Tendinitis These exercises may help you when beginning to rehabilitate your injury. They may resolve your symptoms with or without further involvement from your physician, physical therapist or athletic trainer. While completing these exercises, remember:   Muscles can gain both the endurance and the strength needed for everyday activities through controlled exercises.  Complete these exercises as instructed by your physician,  physical therapist or athletic trainer. Progress the resistance and repetitions only as guided.  You may experience muscle soreness or fatigue, but the pain or discomfort you are trying to eliminate should never worsen during these exercises. If this pain does worsen, stop and make certain you are following the directions exactly. If the pain is still present after adjustments, discontinue the exercise until you can discuss the trouble with your clinician.  STRENGTH - Plantar-flexors   Sit with your right / left leg extended. Holding onto both ends of a rubber exercise band/tubing, loop it around the ball of your foot. Keep a slight tension in the band.  Slowly push your toes away from you, pointing them downward.  Hold this position for 10 seconds. Return slowly, controlling the tension in the band/tubing. Repeat 3 times. Complete this exercise 2 times per day.   STRENGTH - Plantar-flexors   Stand with your feet shoulder width apart. Steady yourself with a wall or table using as little support as needed.  Keeping your weight evenly spread over the width of your feet, rise up on your toes.*  Hold this position for 10 seconds. Repeat 3 times. Complete this exercise 2 times per day.  *If this is too easy, shift your weight toward your right / left leg until you feel challenged. Ultimately, you may be asked to do this exercise with your right / left foot only.  STRENGTH  Plantar-flexors, Eccentric  Note: This exercise can place a lot of stress on your foot and ankle. Please complete this exercise only if specifically instructed by your caregiver.   Place the balls of your feet on a step. With your hands, use only enough support from a wall or rail to keep your balance.  Keep your knees straight and rise up on your toes.  Slowly shift your weight entirely to your right / left toes and pick up your opposite foot. Gently and with controlled movement, lower your weight through your right /  left foot so that your heel drops below the level of the step. You will feel a slight stretch in the back of your calf at the end position.  Use the healthy leg to help rise up onto  the balls of both feet, then lower weight only on the right / left leg again. Build up to 15 repetitions. Then progress to 3 consecutive sets of 15 repetitions.*  After completing the above exercise, complete the same exercise with a slight knee bend (about 30 degrees). Again, build up to 15 repetitions. Then progress to 3 consecutive sets of 15 repetitions.* Perform this exercise 2 times per day.  *When you easily complete 3 sets of 15, your physician, physical therapist or athletic trainer may advise you to add resistance by wearing a backpack filled with additional weight.  STRENGTH - Plantar Flexors, Seated   Sit on a chair that allows your feet to rest flat on the ground. If necessary, sit at the edge of the chair.  Keeping your toes firmly on the ground, lift your right / left heel as far as you can without increasing any discomfort in your ankle. Repeat 3 times. Complete this exercise 2 times a day.

## 2024-08-07 NOTE — Progress Notes (Unsigned)
 "  Subjective:  Patient ID: Sarah Barber, female    DOB: July 07, 1976,  MRN: [id number]  Chief Complaint  Patient presents with   Foot Pain    Bilateral foot pain entire bottom of feet ache x 2 month Not diabetic. No anti coag    Discussed the use of AI scribe software for clinical note transcription with the patient, who gave verbal consent to proceed.  History of Present Illness Sarah Barber is a 49 year old female with bilateral plantar fasciitis, Achilles tendinitis, acquired flat foot, and venous insufficiency who presents with severe bilateral foot and ankle pain.  She has severe bilateral foot and ankle pain involving the plantar, medial, lateral, and heel regions, sparing only the dorsal feet. Pain is worse with prolonged standing and walking, especially on concrete at work, and is most intense with first steps in the morning and after rest. She notes instability and increased pain when changing from work boots to sneakers, difficulty ambulating, inability to stand still due to pain with frequent weight shifting, and forefoot pain when standing on tiptoe.  She has persistent medial and posterior ankle pain on both sides that worsens with weight bearing and diffuse foot soreness after prolonged standing. Pain radiates within the feet but not to the back, hips, or buttocks. She denies current back pain.  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  She has bilateral lower extremity swelling and was told she may have venous insufficiency or edema. She does not use compression stockings or elevate her legs. She has not used anti-inflammatory medications or physical therapy for her foot or ankle pain. She denies renal disease.  She noticed black discoloration of one toenail without trauma or prior  treatment. She has not tried topical therapy but is willing to use it.      Objective:    Physical Exam MUSCULOSKELETAL: Normal sensation along the nerve of the ankles. No tenderness on palpation of the foot. Pain along the inside of the ankle, especially up the back. Antalgic gait with feet flattening and everting. Flat feet with pronation deformity.   No images are attached to the encounter.    Results Radiology Bilateral foot and ankle X-ray (08/07/2024): No acute osseous abnormality, no fracture, no significant arthropathy (Independently interpreted)  Steroid injection, bilateral heels Steroid injected into medial aspect of both heels. Immediate burning sensation reported. Numbing effect achieved. No complications noted.   Assessment:   1. Acquired pronation deformity of foot, left   2. Plantar fasciitis, left   3. Plantar fasciitis, right   4. Acquired pronation deformity of right foot   5. Achilles tendonitis, bilateral      Plan:  Patient was evaluated and treated and all questions answered.  Assessment and Plan Assessment & Plan Bilateral plantar fasciitis Chronic, severe bilateral plantar foot pain consistent with plantar fasciitis. Imaging showed no acute osseous pathology. Anticipated benefit from multimodal conservative management and local corticosteroid injection. - Administered bilateral corticosteroid injections to the medial heels. - Provided instructions for plantar fascia and Achilles tendon stretching exercises twice daily. - Performed foot scans for custom orthotic fitting. 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 - Scheduled follow-up in three weeks to assess response.  Bilateral Achilles tendinitis Bilateral Achilles tendon pain at insertion, likely due to biomechanical overload from flat feet and  occupational factors. Conservative management prioritized. - Administered bilateral corticosteroid  injections to the heels, targeting both plantar fascia and Achilles insertion. - Provided heel lifts for shoes to reduce Achilles tendon strain. - Instructed on Achilles-specific stretching exercises, including heel drops and wall push lunges.  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 Custom orthotics expected to improve biomechanics. - Performed foot scans for custom orthotic inserts to provide rigid arch support. - Recommended continued use of current supportive work shoes until orthotics are available. - Discussed insurance coverage and cost of custom orthotics, including potential out-of-pocket expenses and device longevity.  Lower extremity edema secondary to venous insufficiency Lower extremity edema likely secondary to venous insufficiency, exacerbated by prolonged standing. Compression and elevation are first-line interventions. - Recommended daily use of below-knee gradient compression stockings (15-20 mmHg) at work. - Advised leg elevation at home, including lying on the ground with legs propped up against a wall for 10 minutes. - Provided anticipatory guidance regarding compression and elevation as first-line interventions for venous insufficiency-related edema.      Return in about 3 weeks (around 08/28/2024) for Plantar Fasciitis.     Check nail dystrophy "

## 2024-08-10 ENCOUNTER — Telehealth: Admitting: Physician Assistant

## 2024-08-10 DIAGNOSIS — G8929 Other chronic pain: Secondary | ICD-10-CM | POA: Diagnosis not present

## 2024-08-10 DIAGNOSIS — M25562 Pain in left knee: Secondary | ICD-10-CM | POA: Diagnosis not present

## 2024-08-10 MED ORDER — METHYLPREDNISOLONE 4 MG PO TBPK: ORAL_TABLET | ORAL | 0 refills | Status: AC

## 2024-08-10 NOTE — Patient Instructions (Addendum)
 " Sarah Barber, thank you for joining Delon CHRISTELLA Dickinson, PA-C for today's virtual visit.  While this provider is not your primary care provider (PCP), if your PCP is located in our provider database this encounter information will be shared with them immediately following your visit.   A [facility] MyChart account gives you access to today's visit and all your visits, tests, and labs performed at [facility]  click here if you don't have a  MyChart account or go to [email or web address]  Consent: (Patient) Sarah Barber verbal consent for this virtual visit at the beginning of the encounter.  Current Medications:  Current Outpatient Medications:    methylPREDNISolone  (MEDROL  DOSEPAK) 4 MG TBPK tablet, 6 day taper; take as directed on package instructions, Disp: 21 tablet, Rfl: 0   albuterol  (VENTOLIN  HFA) 108 (90 Base) MCG/ACT inhaler, Inhale 2 puffs into the lungs every 6 (six) hours as needed for wheezing or shortness of breath., Disp: 8 g, Rfl: 0   aspirin  EC 325 MG tablet, Take 1 tablet (325 mg total) by mouth daily., Disp: 30 tablet, Rfl: 0   COVID-19 At-Home Test KIT, Use to test for COVID, Disp: 1 kit, Rfl: 0   Galcanezumab -gnlm (EMGALITY ) 120 MG/ML SOAJ, Inject 2 Pens into the skin every 30 (thirty) days., Disp: 2.24 mL, Rfl: 0   Galcanezumab -gnlm (EMGALITY ) 120 MG/ML SOAJ, Inject 1 Pen into the skin every 30 (thirty) days., Disp: 1.12 mL, Rfl: 3   loperamide  (IMODIUM  A-D) 2 MG tablet, Take 1 tablet (2 mg total) by mouth 4 (four) times daily as needed for diarrhea or loose stools., Disp: 30 tablet, Rfl: 0   meloxicam  (MOBIC ) 15 MG tablet, Take 1 tablet (15 mg total) by mouth daily., Disp: 30 tablet, Rfl: 0   ondansetron  (ZOFRAN -ODT) 4 MG disintegrating tablet, Take 1-2 tablets (4-8 mg total) by mouth every 8 (eight) hours as needed., Disp: 20 tablet, Rfl: 0   Rimegepant Sulfate (NURTEC) 75 MG TBDP, Take 1 tablet (75 mg total) by mouth as  needed., Disp: 8 tablet, Rfl: 6   topiramate  (TOPAMAX ) 50 MG tablet, Take 1 tablet (50 mg total) by mouth at bedtime., Disp: 90 tablet, Rfl: 0   zolmitriptan  (ZOMIG ) 5 MG tablet, Take 1 tablet (5 mg total) by mouth as needed for migraine., Disp: 10 tablet, Rfl: 6  Current Facility-Administered Medications:    oxyCODONE  (Oxy IR/ROXICODONE ) immediate release tablet 5 mg, 5 mg, Oral, Q4H PRN, Bokshan, Steven, MD   Medications ordered in this encounter:  Meds ordered this encounter  Medications   methylPREDNISolone  (MEDROL  DOSEPAK) 4 MG TBPK tablet    Sig: 6 day taper; take as directed on package instructions    Dispense:  21 tablet    Refill:  0    Supervising Provider:   BLAISE ALEENE KIDD [phone]     *If you need refills on other medications prior to your next appointment, please contact your pharmacy*  Follow-Up: Call back or seek an in-person evaluation if the symptoms worsen or if the condition fails to improve as anticipated.     Other Instructions  Long-Term (Chronic) Knee Pain in Adults: What It Means Knee pain that lasts longer than 3 months is called chronic knee pain. You may have pain in one or both knees. Symptoms of chronic knee pain may also include swelling and stiffness. Many conditions can cause chronic knee pain. The most common cause is wear and tear of your knee joint as you get older. Other possible causes  include: A disease that causes inflammation of the knee, such as rheumatoid arthritis. This usually affects both knees. A condition called inflammatory arthritis, such as gout. An injury to the knee that causes arthritis. An injury to the knee that damages the ligaments. Ligaments are tissues that connect bones to each other. Runner's knee or pain behind the kneecap. Treatment for chronic knee pain depends on the cause. The main treatments for chronic knee pain are: Doing exercises to help your knee move better and get stronger, called physical  therapy. Losing weight if you are overweight. This condition may also be treated with medicines, injections, a knee sleeve or brace, and by using crutches. You health care provider may also recommend rest, ice, pressure (compression), and elevation, also called RICE therapy. Follow these instructions at home: If you have a knee sleeve or brace that can be taken off:  Wear the knee sleeve or brace as told by your provider. Take it off only if your provider says that you can. Check the skin around it every day. Tell your provider if you see problems. Loosen the knee sleeve or brace if your toes tingle, are numb, or turn cold and blue. Keep the knee sleeve or brace clean and dry. Bathing If the knee sleeve or brace is not waterproof: Do not let it get wet. Cover it when you take a bath or shower. Use a cover that does not let any water in. Managing pain, stiffness, and swelling     If told, put heat on the area. Do this as often as told. Use the heat source that your provider recommends, such as a moist heat pack or a heating pad. If you have a knee sleeve or brace that you can take off, remove it as told. Place a towel between your skin and the heat source. Leave the heat on for 20-30 minutes. If told, put ice on the area. If you have a knee sleeve or brace that you can take off, remove it as told. Put ice in a plastic bag. Place a towel between your skin and the bag. Leave the ice on for 20 minutes, 2-3 times a day. If your skin turns bright red, remove the ice or heat right away to prevent skin damage. The risk of damage is higher if you cannot feel pain, heat, or cold. Move your toes often to reduce stiffness and swelling. Raise the injured area above the level of your heart while you are sitting or lying down. Use a pillow to support your foot as needed. Activity Avoid activities where both feet leave the ground at the same time. Avoid running, jumping rope, and doing jumping  jacks. Follow the exercise plan that your provider made for you. Your provider may suggest that you: Avoid activities that make knee pain worse. This may mean that you need to change your exercise routines, sports, or job duties. Wear shoes with cushioned soles. Avoid sports that require running and sudden changes in direction. Do physical therapy. Physical therapy helps your knee move better and get stronger. Exercise as told. Do exercises that increase balance and strength, such as tai chi and yoga. Do not stand or walk on your injured knee until you're told it's okay. Use crutches as told. Return to normal activities when you're told. Ask what things are safe for you to do. General instructions Take your medicines only as told by your provider. If you are overweight, work with your provider and an expert in healthy  eating called a dietitian to set goals to lose weight. Losing even a little weight can reduce knee pain. Being overweight can make your knee hurt more. Do not smoke, vape, or use products with nicotine or tobacco in them. If you need help quitting, talk with your provider. Keep all follow-up visits. Your provider will monitor your pain and try other treatments if needed. Contact a health care provider if: You have knee pain that is not getting better or gets worse. You are not able to do your exercises due to knee pain. Get help right away if: Your knee swells and the swelling becomes worse. You cannot move your knee. You have severe knee pain. This information is not intended to replace advice given to you by your health care provider. Make sure you discuss any questions you have with your health care provider. Document Revised: 05/01/2024 Document Reviewed: 08/20/2022 Elsevier Patient Education  [employer].  If you have been instructed to have an in-person evaluation today at a local Urgent Care facility, please use the link below. It will take you to a list of all of  our available [facility], including address, phone number and hours of operation. Please do not delay care.  [facility]  If you or a family member do not have a primary care provider, use the link below to schedule a visit and establish care. When you choose a [facility] primary care physician or advanced practice provider, you gain a long-term partner in health. Find a Primary Care Provider  Learn more about [facility]'s in-office and virtual care options: [facility] - Get Care Now "

## 2024-08-11 ENCOUNTER — Encounter (HOSPITAL_BASED_OUTPATIENT_CLINIC_OR_DEPARTMENT_OTHER): Payer: Self-pay | Admitting: Orthopaedic Surgery

## 2024-08-13 ENCOUNTER — Encounter: Payer: Self-pay | Admitting: Sports Medicine

## 2024-08-13 ENCOUNTER — Ambulatory Visit: Admitting: Sports Medicine

## 2024-08-13 ENCOUNTER — Other Ambulatory Visit: Payer: Self-pay

## 2024-08-13 DIAGNOSIS — G8929 Other chronic pain: Secondary | ICD-10-CM

## 2024-08-13 DIAGNOSIS — M2550 Pain in unspecified joint: Secondary | ICD-10-CM

## 2024-08-13 DIAGNOSIS — M25461 Effusion, right knee: Secondary | ICD-10-CM

## 2024-08-13 NOTE — Progress Notes (Signed)
 Patient says that she has swelling in both of the knees that has been consistent over the last several months. She says that the swelling never goes away completely, but will get better and worse without a noticeable pattern. She says that she has had pain in both of the ankles as well. She had injections in both of her feet that gave her relief in the ankles, as well as up past the knees, although those injections are wearing off. She will get pain in the knees at rest that radiates up the front of the thigh. Patient uses hot baths with epsom salts, which are helpful, as well as frozen bottles on the knees. She was prescribed a steroid, but the pharmacy has not filled that prescription, so she has not taken that yet. She denies having swelling in any other joints.

## 2024-08-13 NOTE — Progress Notes (Signed)
 "  Sarah Barber - 49 y.o. female MRN [id number]  Date of birth: December 23, 1975  Office Visit Note: Visit Date: 08/13/2024 PCP: Gwenn Hila, NP Referred by: Genelle Standing, MD  Subjective: Chief Complaint  Patient presents with   Left Knee - Pain   Right Knee - Pain   HPI: Sarah Barber is a pleasant 49 y.o. female who presents today for acute on chronic bilateral knee pain with recurrent effusion.  Discussed the use of AI scribe software for clinical note transcription with the patient, who gave verbal consent to proceed.  History of Present Illness Sarah Barber is a 49 year old female with chronic bilateral knee and ankle pain who presents for evaluation of joint pain, swelling, and instability.  She has had intermittent bilateral knee pain and swelling for several months. Pain is mainly anterior, radiates up the thighs, and is worse in the left knee, where swelling is also greater. She notes clicking, popping, posterior knee pain, and intermittent instability, especially in the left knee. Symptoms worsen with prolonged standing and activity, and she has morning and end-of-day stiffness that improves with movement, most prominently in the knees.  She previously dislocated the right patella and had right knee surgery about two years ago, including a biopsy for suspected PVNS that was negative.  The left knee has started to feel unstable like the right had in the past.  She has not had knee injections. Foot injections, most recently on January 30, gave her feet/ankles good relief and even some knee relief. She has not yet started recently prescribed meloxicam . She has used prednisone in the past but is unsure of the indication and has not used a Medrol  Dosepak.  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 Heel cushions from podiatry help her feet but not her knees, and she is awaiting custom  orthotics. She avoids running, jumping, and other high-impact activities due to fear of reinjury. She stopped physical therapy previously due to work but is now able to resume.  She has ankle edema that progresses through the day with sock indentations by evening. She does not use compression socks. She also notes intermittent stiffness and tightness in the hands with subjective swelling, and has had two isolated episodes of leg rash or erythema that she associated with possible food or drink triggers.  She has never been tested for autoimmune conditions including rheumatoid arthritis, lupus, or ankylosing spondylitis.  Pertinent ROS were reviewed with the patient and found to be negative unless otherwise specified above in HPI.   Assessment & Plan: Visit Diagnoses:  1. Bilateral chronic knee pain   2. Bilateral knee effusions   3. Arthralgia of multiple joints   4. Chronic ankle pain, bilateral    Assessment & Plan Chronic bilateral knee pain with possible inflammatory arthritis Chronic bilateral knee pain and swelling, left greater than right, with unclear etiology. Differential includes inflammatory arthritis or autoimmune process. Prior right knee biopsy negative for pigmented villonodular synovitis. - Ordered CBC, ESR, CRP, rheumatoid factor, anti-CCP antibodies, and ANA w/reflex. - Instructed her to initiate prednisone first, followed by meloxicam , not concurrently. - Advised her to monitor and report response to medications --> response to anti-inflammatory medication specific given arthralgias - Will communicate laboratory results and further plan via MyChart and follow-up message. - Will coordinate care with her other provider based on laboratory findings.  Patellar instability, Hx of right knee, active with left knee Right patellar  instability post-surgery with improved stability but concern for left knee symptoms. Limits high-impact activities due to fear of reinjury. -  Encouraged her to re-establish physical therapy as her work schedule allows, focusing on left knee instability and swelling  Chronic bilateral ankle pain with leg edema Chronic bilateral ankle pain and leg edema, worsened by prolonged standing. Etiology may be multifactorial, including occupational factors and possible systemic or autoimmune causes, dependent edema/venous insufficiency - Ordered laboratory work to evaluate for systemic or autoimmune causes. - Recommended use of compression socks, leg elevation for edema management. - Advised her to continue with heel cushions and await orthotics.  Multiple arthralgias Intermittent pain and stiffness in multiple joints, worse with activity and at the end of the day, also present upon waking. No consistent rash or systemic symptoms. Evaluation for underlying rheumatologic or autoimmune etiology warranted. - Ordered laboratory work to evaluate for underlying rheumatologic or autoimmune etiology: CBC, ESR, CRP, rheumatoid factor, anti-CCP antibodies, and ANA w/reflex - Advised her to monitor and report response to anti-inflammatory medications. - Will follow up regarding laboratory results and further management.  Follow-up: Return for will mychart message once labs result.   Meds & Orders: No orders of the defined types were placed in this encounter.   Orders Placed This Encounter  Procedures   US  Extrem Low Left Ltd   CBC with Differential   Sed Rate (ESR)   C-reactive protein   Rheumatoid Factor   Cyclic citrul peptide antibody, IgG   ANA SCREEN,IFA, WITH REFLEX TO TITER/MIXED CONNECTIVE PANEL     Procedures: No procedures performed      Clinical History: No specialty comments available.  She reports that she quit smoking about 8 years ago. Her smoking use included cigarettes. She has never used smokeless tobacco. No results for input(s): HGBA1C, LABURIC in the last 8760 hours.  Objective:   Vital Signs: LMP 07/09/2019    Physical Exam  Gen: Well-appearing, in no acute distress; non-toxic CV: Well-perfused. Warm.  Resp: Breathing unlabored on room air; no wheezing. Psych: Fluid speech in conversation; appropriate affect; normal thought process  *MSK/Ortho Exam: Physical Exam MUSCULOSKELETAL: Mild effusion in the left knee, with trace effusionon the right knee. Right knee with well-healed incision from previous patellar surgery. No other abnormalities detected in the knees.  May be very early symptoms of dactylitis over the MCP and PIP joints bilaterally but no significant swelling or overlying skin redness/rashes.  Imaging: US  Extrem Low Left Ltd Result Date: 08/13/2024 Limited musculoskeletal ultrasound of both the right and left knee was performed today.  Small superior patellar spur noted on the left knee.  The right knee has a trace to small suprapatellar effusion without significant hyperemia.  The left knee has a small to small to moderate left knee effusion present.  *Independent review and interpretation of 4 view left and right knee x-ray from 07/29/2024 was performed by myself today.  There is no significant tibiofemoral arthritic change.  There is mild to mild to moderate patellofemoral joint space narrowing with a small superior patellar spur on the left.  Likely small joint effusion is noted in the suprapatellar pouch bilaterally on lateral film.  EXAM: 4 OR MORE VIEW(S) XRAY OF THE LEFT KNEE 07/29/2024 10:38:17 AM   COMPARISON: None available.   CLINICAL HISTORY: Pain.   FINDINGS:   BONES AND JOINTS: No acute fracture. No malalignment. Small suprapatellar knee joint effusion.   SOFT TISSUES: Unremarkable.   IMPRESSION: 1. Small suprapatellar knee joint effusion.  Electronically signed by: Morgane Naveau MD 08/02/2024 02:03 AM EST RP Workstation: [id number]   Past Medical/Family/Surgical/Social History: Medications & Allergies reviewed per EMR, new medications  updated. 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   Vision changes    Family History  Problem Relation Age of Onset   Breast cancer Mother 24   Diabetes Mother    Cerebrovascular Accident Father        x2   Breast cancer Maternal Grandmother 67 - 72   Diabetes Maternal Grandmother    Kidney disease Maternal Grandmother    Lung cancer Maternal Grandfather    Past Surgical History:  Procedure Laterality Date   CESAREAN SECTION      x 2   HYSTERECTOMY ABDOMINAL WITH SALPINGECTOMY Bilateral 07/28/2019   Procedure: HYSTERECTOMY ABDOMINAL WITH BILATERAL SALPINGECTOMY AND RIGHT OOPHORECTOMY;  Surgeon: Fredirick Glenys RAMAN, MD;  Location: [facility] OR;  Service: Gynecology;  Laterality: Bilateral;   KNEE ARTHROSCOPY Right 01/23/2022   Procedure: RIGHT KNEE ARTHROSCOPY WITH DEBRIDEMENT;  Surgeon: Genelle Standing, MD;  Location: [facility];  Service: Orthopedics;  Laterality: Right;   KNEE RECONSTRUCTION Right 01/23/2022   Procedure: RIGHT KNEE PATELLOFEMORAL LIGAMENT RECONSTRUCTION;  Surgeon: Genelle Standing, MD;  Location: [facility];  Service: Orthopedics;  Laterality: Right;   Social History   Occupational History   Not on file  Tobacco Use   Smoking status: Former    Current packs/day: 0.00    Types: Cigarettes    Quit date: 09/2015    Years since quitting: 8.9   Smokeless  tobacco: Never   Tobacco comments:    smoked off and on  Vaping Use   Vaping status: Never Used  Substance and Sexual Activity   Alcohol use: Yes    Comment: occasional   Drug use: Never   Sexual activity: Not on file   "

## 2024-08-14 ENCOUNTER — Inpatient Hospital Stay: Admission: RE | Admit: 2024-08-14

## 2024-08-14 DIAGNOSIS — G8929 Other chronic pain: Secondary | ICD-10-CM

## 2024-08-27 ENCOUNTER — Ambulatory Visit (HOSPITAL_BASED_OUTPATIENT_CLINIC_OR_DEPARTMENT_OTHER): Admitting: Orthopaedic Surgery
